# Patient Record
Sex: Female | Born: 1980 | Race: White | Hispanic: No | Marital: Married | Smoking: Never smoker
Health system: Southern US, Community
[De-identification: ages and names within clinical notes are randomized; demographics above are authoritative.]

## PROBLEM LIST (undated history)

## (undated) DIAGNOSIS — R519 Headache, unspecified: Secondary | ICD-10-CM

## (undated) DIAGNOSIS — F319 Bipolar disorder, unspecified: Secondary | ICD-10-CM

## (undated) DIAGNOSIS — F32A Depression, unspecified: Secondary | ICD-10-CM

## (undated) DIAGNOSIS — K219 Gastro-esophageal reflux disease without esophagitis: Secondary | ICD-10-CM

## (undated) DIAGNOSIS — F419 Anxiety disorder, unspecified: Secondary | ICD-10-CM

## (undated) DIAGNOSIS — O24419 Gestational diabetes mellitus in pregnancy, unspecified control: Secondary | ICD-10-CM

## (undated) DIAGNOSIS — F329 Major depressive disorder, single episode, unspecified: Secondary | ICD-10-CM

## (undated) DIAGNOSIS — G4730 Sleep apnea, unspecified: Secondary | ICD-10-CM

## (undated) DIAGNOSIS — Z973 Presence of spectacles and contact lenses: Secondary | ICD-10-CM

## (undated) DIAGNOSIS — M1990 Unspecified osteoarthritis, unspecified site: Secondary | ICD-10-CM

## (undated) DIAGNOSIS — Z9889 Other specified postprocedural states: Secondary | ICD-10-CM

## (undated) DIAGNOSIS — R112 Nausea with vomiting, unspecified: Secondary | ICD-10-CM

## (undated) HISTORY — PX: WISDOM TOOTH EXTRACTION: SHX21

## (undated) HISTORY — PX: CHOLECYSTECTOMY: SHX55

---

## 2008-10-21 ENCOUNTER — Emergency Department (HOSPITAL_COMMUNITY): Admission: EM | Admit: 2008-10-21 | Discharge: 2008-10-21 | Payer: Self-pay | Admitting: Family Medicine

## 2008-10-21 ENCOUNTER — Inpatient Hospital Stay (HOSPITAL_COMMUNITY): Admission: AD | Admit: 2008-10-21 | Discharge: 2008-10-21 | Payer: Self-pay | Admitting: Obstetrics and Gynecology

## 2009-03-07 ENCOUNTER — Encounter: Admission: RE | Admit: 2009-03-07 | Discharge: 2009-03-28 | Payer: Self-pay | Admitting: Obstetrics and Gynecology

## 2009-06-30 ENCOUNTER — Inpatient Hospital Stay (HOSPITAL_COMMUNITY): Admission: AD | Admit: 2009-06-30 | Discharge: 2009-06-30 | Payer: Self-pay | Admitting: Obstetrics and Gynecology

## 2009-08-14 ENCOUNTER — Inpatient Hospital Stay (HOSPITAL_COMMUNITY): Admission: AD | Admit: 2009-08-14 | Discharge: 2009-08-16 | Payer: Self-pay | Admitting: Obstetrics and Gynecology

## 2009-12-18 ENCOUNTER — Emergency Department (HOSPITAL_COMMUNITY): Admission: EM | Admit: 2009-12-18 | Discharge: 2009-12-18 | Payer: Self-pay | Admitting: Family Medicine

## 2010-02-28 ENCOUNTER — Ambulatory Visit (HOSPITAL_COMMUNITY)
Admission: RE | Admit: 2010-02-28 | Discharge: 2010-02-28 | Payer: Self-pay | Source: Home / Self Care | Admitting: Obstetrics and Gynecology

## 2010-03-31 DIAGNOSIS — O24419 Gestational diabetes mellitus in pregnancy, unspecified control: Secondary | ICD-10-CM

## 2010-03-31 HISTORY — DX: Gestational diabetes mellitus in pregnancy, unspecified control: O24.419

## 2010-06-17 LAB — CBC
HCT: 32.5 % — ABNORMAL LOW (ref 36.0–46.0)
MCHC: 35.2 g/dL (ref 30.0–36.0)
MCV: 88.3 fL (ref 78.0–100.0)
MCV: 88.3 fL (ref 78.0–100.0)
Platelets: 208 10*3/uL (ref 150–400)
RBC: 3.68 MIL/uL — ABNORMAL LOW (ref 3.87–5.11)
WBC: 10.8 10*3/uL — ABNORMAL HIGH (ref 4.0–10.5)
WBC: 8.7 10*3/uL (ref 4.0–10.5)

## 2010-06-17 LAB — RPR: RPR Ser Ql: NONREACTIVE

## 2010-07-07 LAB — POCT URINALYSIS DIP (DEVICE)
Protein, ur: NEGATIVE mg/dL
Specific Gravity, Urine: 1.02 (ref 1.005–1.030)
Urobilinogen, UA: 1 mg/dL (ref 0.0–1.0)

## 2010-07-07 LAB — GC/CHLAMYDIA PROBE AMP, GENITAL: GC Probe Amp, Genital: NEGATIVE

## 2010-07-07 LAB — CBC
Hemoglobin: 14.3 g/dL (ref 12.0–15.0)
RBC: 4.6 MIL/uL (ref 3.87–5.11)
RDW: 12.3 % (ref 11.5–15.5)

## 2010-07-07 LAB — WET PREP, GENITAL
Trich, Wet Prep: NONE SEEN
Yeast Wet Prep HPF POC: NONE SEEN

## 2010-07-07 LAB — URINE CULTURE: Culture: NO GROWTH

## 2011-01-22 ENCOUNTER — Inpatient Hospital Stay (HOSPITAL_COMMUNITY): Payer: Self-pay

## 2011-01-22 ENCOUNTER — Inpatient Hospital Stay (HOSPITAL_COMMUNITY)
Admission: AD | Admit: 2011-01-22 | Discharge: 2011-01-22 | Disposition: A | Discharge status: Home / Self Care | Payer: Self-pay | Source: Ambulatory Visit | Attending: Obstetrics and Gynecology | Admitting: Obstetrics and Gynecology

## 2011-01-22 ENCOUNTER — Encounter (HOSPITAL_COMMUNITY): Payer: Self-pay | Admitting: *Deleted

## 2011-01-22 DIAGNOSIS — R109 Unspecified abdominal pain: Secondary | ICD-10-CM | POA: Insufficient documentation

## 2011-01-22 DIAGNOSIS — R102 Pelvic and perineal pain: Secondary | ICD-10-CM

## 2011-01-22 DIAGNOSIS — N72 Inflammatory disease of cervix uteri: Secondary | ICD-10-CM

## 2011-01-22 DIAGNOSIS — N949 Unspecified condition associated with female genital organs and menstrual cycle: Secondary | ICD-10-CM | POA: Insufficient documentation

## 2011-01-22 HISTORY — DX: Gestational diabetes mellitus in pregnancy, unspecified control: O24.419

## 2011-01-22 LAB — DIFFERENTIAL
Eosinophils Absolute: 0.1 10*3/uL (ref 0.0–0.7)
Eosinophils Relative: 1 % (ref 0–5)
Lymphs Abs: 2.6 10*3/uL (ref 0.7–4.0)
Monocytes Absolute: 0.3 10*3/uL (ref 0.1–1.0)
Monocytes Relative: 4 % (ref 3–12)

## 2011-01-22 LAB — CBC
HCT: 38 % (ref 36.0–46.0)
Hemoglobin: 13.2 g/dL (ref 12.0–15.0)
MCH: 29.7 pg (ref 26.0–34.0)
MCV: 85.6 fL (ref 78.0–100.0)
Platelets: 248 10*3/uL (ref 150–400)
RBC: 4.44 MIL/uL (ref 3.87–5.11)

## 2011-01-22 LAB — URINALYSIS, ROUTINE W REFLEX MICROSCOPIC
Bilirubin Urine: NEGATIVE
Glucose, UA: NEGATIVE mg/dL
Specific Gravity, Urine: >1.03 — ABNORMAL HIGH (ref 1.005–1.030)
Urobilinogen, UA: 0.2 mg/dL (ref 0.0–1.0)

## 2011-01-22 LAB — POCT PREGNANCY, URINE: Preg Test, Ur: NEGATIVE

## 2011-01-22 LAB — URINE MICROSCOPIC-ADD ON

## 2011-01-22 MED ORDER — KETOROLAC TROMETHAMINE 60 MG/2ML IM SOLN
60.0000 mg | Freq: Once | INTRAMUSCULAR | Status: AC
  Administered 2011-01-22: 60 mg via INTRAMUSCULAR
  Filled 2011-01-22: qty 2

## 2011-01-22 MED ORDER — NAPROXEN SODIUM 550 MG PO TABS: 550.0000 mg | ORAL_TABLET | Freq: Two times a day (BID) | ORAL | Status: AC

## 2011-01-22 NOTE — Progress Notes (Signed)
Pt seen at [facility] for ?PID today. C/o low abd pain x 2wks, worse with sexual intercourse.

## 2011-01-22 NOTE — ED Provider Notes (Signed)
History     CSN: [id number] Arrival date & time: 01/22/2011  1:34 PM   None     Chief Complaint  Patient presents with  . Abdominal Pain    HPI Vicki Huynh is a 30 y.o. female who presents to MAU for abdominal pain. She was evaluated by her PCP at [facility] and told to come to MAU for ultrasound. Two weeks ago after having sex had pelvic pain that has continued. Abnormal pelvic exam  last week and scheduled for colcoscopy. Patient received Rocephin and sent home with doxycycline for 2 weeks with diagnosis of PID. Has not gotten results from pap smear yet. The history was provided by the patient and her chart sent from [facility].   Past Medical History  Diagnosis Date  . Diabetic mellitus, gestational     No past surgical history on file.  No family history on file.  History  Substance Use Topics  . Smoking status: Never Smoker   . Smokeless tobacco: Not on file  . Alcohol Use: No    OB History    Grav Para Term Preterm Abortions TAB SAB Ect Mult Living   4 2 2  2 1 1   2       Review of Systems  Constitutional: Negative for fever, chills, diaphoresis and fatigue.  HENT: Negative for ear pain, congestion, sore throat, facial swelling, neck pain, neck stiffness, dental problem and sinus pressure.   Eyes: Negative for photophobia, pain and discharge.  Respiratory: Negative for cough, chest tightness and wheezing.   Gastrointestinal: Positive for nausea, abdominal pain and constipation. Negative for vomiting and diarrhea.  Genitourinary: Positive for pelvic pain. Negative for dysuria, frequency, flank pain and difficulty urinating.  Musculoskeletal: Positive for back pain. Negative for myalgias and gait problem.  Skin: Negative for color change and rash.  Neurological: Positive for dizziness. Negative for speech difficulty, weakness, light-headedness, numbness and headaches.  Psychiatric/Behavioral: Negative for confusion and agitation. The patient is  not nervous/anxious.     Allergies  Wool alcohol  Home Medications  No current outpatient prescriptions on file.  BP 121/66  Pulse 92  Temp(Src) 98.7 F (37.1 C) (Oral)  Resp 20  Ht 5\' 6"  (1.676 m)  Wt 215 lb (97.523 kg)  BMI 34.70 kg/m2  SpO2 98%  LMP 12/27/2010  Physical Exam  Nursing note and vitals reviewed. Constitutional: She is oriented to person, place, and time. She appears well-developed and well-nourished.  HENT:  Head: Normocephalic.  Eyes: EOM are normal.  Neck: Neck supple.  Cardiovascular: Normal rate.   Pulmonary/Chest: Effort normal.  Abdominal: Soft. There is no tenderness.  Genitourinary:       External genitalia without lesions. White vaginal discharge. Cervix inflamed, right adnexal tenderness that is mild. Uterus without palpable enlargement.   Musculoskeletal: Normal range of motion.  Neurological: She is alert and oriented to person, place, and time. No cranial nerve deficit.  Skin: Skin is warm and dry.    ED Course  Procedures  US Transvaginal Non-ob  01/22/2011  *RADIOLOGY REPORT*  Clinical Data: Pelvic pain and tenderness.  TRANSABDOMINAL AND TRANSVAGINAL ULTRASOUND OF PELVIS  Technique:  Both transabdominal and transvaginal ultrasound examinations of the pelvis were performed.  Transabdominal technique was performed for global imaging of the pelvis including uterus, ovaries, adnexal regions, and pelvic cul-de-sac.  It was necessary to proceed with endovaginal exam following the transabdominal exam to visualize the endometrium and right ovary.  Comparison:  None.  Findings: Uterus:  Normal in size and appearance  Endometrium: Normal in thickness and appearance.  Double layer endometrial thickness measures 12 mm transvaginally.  Minimal fluid noted in the endometrial cavity, which is of doubtful clinical significance.  Right ovary: Normal appearance/no adnexal mass  Left ovary: Normal appearance/no adnexal mass  Other Findings:  No free fluid   IMPRESSION: No evidence of pelvic mass, fluid collection, or other significant abnormality.  Original Report Authenticated By: Danae Orleans, M.D.   US Pelvis Complete  01/22/2011  *RADIOLOGY REPORT*  Clinical Data: Pelvic pain and tenderness.  TRANSABDOMINAL AND TRANSVAGINAL ULTRASOUND OF PELVIS  Technique:  Both transabdominal and transvaginal ultrasound examinations of the pelvis were performed.  Transabdominal technique was performed for global imaging of the pelvis including uterus, ovaries, adnexal regions, and pelvic cul-de-sac.  It was necessary to proceed with endovaginal exam following the transabdominal exam to visualize the endometrium and right ovary.  Comparison:  None.  Findings: Uterus:  Normal in size and appearance  Endometrium: Normal in thickness and appearance.  Double layer endometrial thickness measures 12 mm transvaginally.  Minimal fluid noted in the endometrial cavity, which is of doubtful clinical significance.  Right ovary: Normal appearance/no adnexal mass  Left ovary: Normal appearance/no adnexal mass  Other Findings:  No free fluid  IMPRESSION: No evidence of pelvic mass, fluid collection, or other significant abnormality.  Original Report Authenticated By: Danae Orleans, M.D.    Results for orders placed during the hospital encounter of 01/22/11 (from the past 24 hour(s))  URINALYSIS, ROUTINE W REFLEX MICROSCOPIC     Status: Abnormal   Collection Time   01/22/11  1:55 PM      Component Value Range   Color, Urine YELLOW  YELLOW    Appearance HAZY (*) CLEAR    Specific Gravity, Urine >1.030 (*) 1.005 - 1.030    pH 6.0  5.0 - 8.0    Glucose, UA NEGATIVE  NEGATIVE (mg/dL)   Hgb urine dipstick TRACE (*) NEGATIVE    Bilirubin Urine NEGATIVE  NEGATIVE    Ketones, ur NEGATIVE  NEGATIVE (mg/dL)   Protein, ur NEGATIVE  NEGATIVE (mg/dL)   Urobilinogen, UA 0.2  0.0 - 1.0 (mg/dL)   Nitrite NEGATIVE  NEGATIVE    Leukocytes, UA SMALL (*) NEGATIVE   URINE MICROSCOPIC-ADD ON      Status: Abnormal   Collection Time   01/22/11  1:55 PM      Component Value Range   Squamous Epithelial / LPF MANY (*) RARE    WBC, UA 3-6  <3 (WBC/hpf)   Bacteria, UA FEW (*) RARE    Urine-Other MUCOUS PRESENT    POCT PREGNANCY, URINE     Status: Normal   Collection Time   01/22/11  3:34 PM      Component Value Range   Preg Test, Ur NEGATIVE    CBC     Status: Normal   Collection Time   01/22/11  3:37 PM      Component Value Range   WBC 7.9  4.0 - 10.5 (K/uL)   RBC 4.44  3.87 - 5.11 (MIL/uL)   Hemoglobin 13.2  12.0 - 15.0 (g/dL)   HCT 16.1  09.6 - 04.5 (%)   MCV 85.6  78.0 - 100.0 (fL)   MCH 29.7  26.0 - 34.0 (pg)   MCHC 34.7  30.0 - 36.0 (g/dL)   RDW 40.9  81.1 - 91.4 (%)   Platelets 248  150 - 400 (K/uL)  DIFFERENTIAL  Status: Normal   Collection Time   01/22/11  3:37 PM      Component Value Range   Neutrophils Relative 62  43 - 77 (%)   Neutro Abs 4.8  1.7 - 7.7 (K/uL)   Lymphocytes Relative 33  12 - 46 (%)   Lymphs Abs 2.6  0.7 - 4.0 (K/uL)   Monocytes Relative 4  3 - 12 (%)   Monocytes Absolute 0.3  0.1 - 1.0 (K/uL)   Eosinophils Relative 1  0 - 5 (%)   Eosinophils Absolute 0.1  0.0 - 0.7 (K/uL)   Basophils Relative 0  0 - 1 (%)   Basophils Absolute 0.0  0.0 - 0.1 (K/uL)   Assessment:  Pelvic Pain  Plan:   Continue antibiotics     Keep follow up appointment for coloscopy    Anaprox DS for pain    MDM          Kerrie Buffalo, NP 01/22/11 1621

## 2011-01-22 NOTE — Progress Notes (Signed)
Patient states she started having abdominal pain about 2 weeks after having intercourse. Has been going to her MD and was sent to MAU for evaluation. Patient states the pain continues, slight nausea and a little increase in vaginal discharge. Patient had an Essure placed in 2011.

## 2011-01-27 NOTE — ED Provider Notes (Signed)
Agree with above note.  Vicki Huynh 01/27/2011 11:28 AM

## 2012-07-07 ENCOUNTER — Other Ambulatory Visit (HOSPITAL_COMMUNITY)
Admission: RE | Admit: 2012-07-07 | Discharge: 2012-07-07 | Disposition: A | Discharge status: Home / Self Care | Payer: BC Managed Care – PPO | Source: Ambulatory Visit | Attending: Family Medicine | Admitting: Family Medicine

## 2012-07-07 ENCOUNTER — Other Ambulatory Visit: Payer: Self-pay | Admitting: Family Medicine

## 2012-07-07 DIAGNOSIS — Z124 Encounter for screening for malignant neoplasm of cervix: Secondary | ICD-10-CM | POA: Insufficient documentation

## 2012-07-07 DIAGNOSIS — Z1151 Encounter for screening for human papillomavirus (HPV): Secondary | ICD-10-CM | POA: Insufficient documentation

## 2013-05-05 ENCOUNTER — Encounter (HOSPITAL_COMMUNITY): Payer: Self-pay | Admitting: Emergency Medicine

## 2013-05-05 ENCOUNTER — Emergency Department (HOSPITAL_COMMUNITY)
Admission: EM | Admit: 2013-05-05 | Discharge: 2013-05-05 | Disposition: A | Discharge status: Home / Self Care | Payer: BC Managed Care – PPO | Attending: Emergency Medicine | Admitting: Emergency Medicine

## 2013-05-05 ENCOUNTER — Emergency Department (HOSPITAL_COMMUNITY): Payer: BC Managed Care – PPO

## 2013-05-05 DIAGNOSIS — Z792 Long term (current) use of antibiotics: Secondary | ICD-10-CM | POA: Insufficient documentation

## 2013-05-05 DIAGNOSIS — R05 Cough: Secondary | ICD-10-CM | POA: Insufficient documentation

## 2013-05-05 DIAGNOSIS — R0602 Shortness of breath: Secondary | ICD-10-CM | POA: Insufficient documentation

## 2013-05-05 DIAGNOSIS — R0789 Other chest pain: Secondary | ICD-10-CM | POA: Insufficient documentation

## 2013-05-05 DIAGNOSIS — R059 Cough, unspecified: Secondary | ICD-10-CM | POA: Insufficient documentation

## 2013-05-05 DIAGNOSIS — IMO0001 Reserved for inherently not codable concepts without codable children: Secondary | ICD-10-CM | POA: Insufficient documentation

## 2013-05-05 DIAGNOSIS — M25519 Pain in unspecified shoulder: Secondary | ICD-10-CM | POA: Insufficient documentation

## 2013-05-05 DIAGNOSIS — Z8632 Personal history of gestational diabetes: Secondary | ICD-10-CM | POA: Insufficient documentation

## 2013-05-05 HISTORY — DX: Major depressive disorder, single episode, unspecified: F32.9

## 2013-05-05 HISTORY — DX: Depression, unspecified: F32.A

## 2013-05-05 MED ORDER — DIAZEPAM 5 MG PO TABS: 5.0000 mg | ORAL_TABLET | Freq: Two times a day (BID) | ORAL | Status: DC

## 2013-05-05 MED ORDER — HYDROCODONE-ACETAMINOPHEN 5-325 MG PO TABS: 1.0000 | ORAL_TABLET | ORAL | Status: DC | PRN

## 2013-05-05 NOTE — ED Provider Notes (Signed)
Medical screening examination/treatment/procedure(s) were performed by non-physician practitioner and as supervising physician I was immediately available for consultation/collaboration.  EKG Interpretation    Date/Time:  Thursday May 05 2013 11:22:10 EST Ventricular Rate:  82 PR Interval:  134 QRS Duration: 90 QT Interval:  364 QTC Calculation: 425 R Axis:   -8 Text Interpretation:  Normal sinus rhythm Moderate voltage criteria for LVH, may be normal variant Nonspecific T wave abnormality No old tracing to compare Confirmed by Rosenhayn  MD-I, Victorious Kundinger ([id number]) on 05/05/2013 11:31:40 AM            Rolland Porter, MD, Abram Sander   Janice Norrie, MD 05/05/13 [phone]

## 2013-05-05 NOTE — ED Notes (Addendum)
PA at bedside.   sts does not want immobilizer.

## 2013-05-05 NOTE — ED Provider Notes (Signed)
CSN: [id number]     Arrival date & time 05/05/13  0957 History   None   This chart was scribed for Barton Dubois PA-C, a non-physician practitioner working with Janice Norrie, MD by Denice Bors, ED Scribe. This patient was seen in room TR07C/TR07C and the patient's care was started at 10:33 AM     Chief Complaint  Patient presents with  . Shoulder Pain   (Consider location/radiation/quality/duration/timing/severity/associated sxs/prior Treatment) The history is provided by the patient. No language interpreter was used.   HPI Comments: Vicki Huynh is a 33 y.o. female who presents to the Emergency Department complaining of constant left shoulder pain radiating to left neck onset gradual for 3 weeks with no known precipitating factors. States she was evaluated recently by PCP for shoulder pain. Describes pain as moderate in severity. Reports associated gradually worsening shortness of breath (3 days), dry cough (1 week), and chest "pressure". Reports shoulder pain is exacerbated at night and with movement of joint. Reports taking Aleve, heat and cold compresses for 5 days with no relief of symptoms. Denies associated recent trauma, fever, numbness, and weakness. Denies smoking cigarettes. Reports PMHx of arthritis, and asthma as a child. Reports family hx of rheumatoid arthritis.  No FH MI.  Past Medical History  Diagnosis Date  . Diabetic mellitus, gestational    Past Surgical History  Procedure Laterality Date  . Wisdom tooth extraction     No family history on file. History  Substance Use Topics  . Smoking status: Never Smoker   . Smokeless tobacco: Not on file  . Alcohol Use: No   OB History   Grav Para Term Preterm Abortions TAB SAB Ect Mult Living   4 2 2  2 1 1   2      Review of Systems  Constitutional: Negative for fever.  Musculoskeletal: Positive for myalgias.  Skin: Positive for wound.  Psychiatric/Behavioral: Negative for confusion.    Allergies  Wool  alcohol  Home Medications   Current Outpatient Rx  Name  Route  Sig  Dispense  Refill  . doxycycline (VIBRAMYCIN) 100 MG capsule   Oral   Take 100 mg by mouth 2 (two) times daily. Patient started taking on 01-17-11 and it is a 2 week course           There were no vitals taken for this visit. Physical Exam  Nursing note and vitals reviewed. Constitutional: She is oriented to person, place, and time. She appears well-developed and well-nourished. No distress.  HENT:  Head: Normocephalic and atraumatic.  Eyes:  Normal appearance  Neck: Normal range of motion.  Cardiovascular: Normal rate and regular rhythm.   Pulmonary/Chest: Effort normal and breath sounds normal. No respiratory distress.  Musculoskeletal: Normal range of motion.  L shoulder w/out deformity, erythema or edema.  Mild tenderness mid-line C5-C6 as well as L trap, ant/post shoulder and deltoid.  Pain w/ passive flexion/abduction greater than 30deg but 5/5 strength in these positions.  2+ radial pulse and distal sensation intact.      Neurological: She is alert and oriented to person, place, and time.  Skin: Skin is warm and dry. No rash noted.  Psychiatric: She has a normal mood and affect. Her behavior is normal.    ED Course  Procedures  COORDINATION OF CARE:  Nursing notes reviewed. Vital signs reviewed. Initial pt interview and examination performed.   10:44 AM-Discussed work up plan with pt at bedside, which includes CXR. Pt agrees with plan.  Treatment plan initiated:Medications - No data to display   Initial diagnostic testing ordered.    Labs Review Labs Reviewed - No data to display Imaging Review No results found.  EKG Interpretation   None       MDM   1. Shoulder pain    Healthy 32yo F presents w/ atraumatic L shoulder pain x 2-3 weeks.  Exam most consistent w/ muscle strain but differential also include cervical radiculopathy and arthritis (pt has arthritis of hands, both parents  w/ RA, pt w/ remote neg work-up).  No findings concerning for septic arthritis.  No RF for ACS.  Will obtain screening EKG. Will treat symptomatically w/ shoulder sling (provided by nursing staff), ice, NSAID and valium.  Pt also c/o cough x 7d + SOB and chest heaviness x 3d.  No respiratory distress and nml breath sounds on exam.  CXR ordered to r/o pna.  10:47 AM   CXR negative and EKG non-ischemic; no prior to compare.  All results discussed w/ patient.  Return precautions discussed. 11:52 AM   I personally performed the services described in this documentation, which was scribed in my presence. The recorded information has been reviewed and is accurate. s   Remer Macho, PA-C 05/05/13 1152

## 2013-05-05 NOTE — ED Notes (Signed)
Pt reports left shoulder pain x 3 weeks, unsure of injury.

## 2013-05-05 NOTE — ED Notes (Signed)
Schinlever, PA at bedside for evaluation. 

## 2013-05-05 NOTE — Discharge Instructions (Signed)
Take vicodin as needed for severe pain and valium as needed for spasm.   Do not drive within four hours of taking these medications (may cause drowsiness or confusion).   Apply heat or ice to painful areas and avoid activities that aggravate pain.  Follow up with the orthopedist if your pain has not started to improve in 5-7 days, or you develop weakness of the injured joint.   Return to ER if you develop fever, redness or worsening pain of joint, worsening chest pain or shortness of breath.

## 2014-01-30 ENCOUNTER — Encounter (HOSPITAL_COMMUNITY): Payer: Self-pay | Admitting: Emergency Medicine

## 2014-02-06 ENCOUNTER — Encounter: Payer: Self-pay | Admitting: Neurology

## 2014-02-06 ENCOUNTER — Ambulatory Visit (INDEPENDENT_AMBULATORY_CARE_PROVIDER_SITE_OTHER): Payer: BC Managed Care – PPO | Admitting: Neurology

## 2014-02-06 ENCOUNTER — Encounter: Payer: Self-pay | Admitting: *Deleted

## 2014-02-06 VITALS — BP 119/80 | HR 86 | Temp 97.5°F | Ht 67.0 in | Wt 243.0 lb

## 2014-02-06 DIAGNOSIS — G5622 Lesion of ulnar nerve, left upper limb: Secondary | ICD-10-CM

## 2014-02-06 DIAGNOSIS — G5621 Lesion of ulnar nerve, right upper limb: Secondary | ICD-10-CM

## 2014-02-06 DIAGNOSIS — M79629 Pain in unspecified upper arm: Secondary | ICD-10-CM

## 2014-02-06 DIAGNOSIS — R202 Paresthesia of skin: Secondary | ICD-10-CM | POA: Insufficient documentation

## 2014-02-06 DIAGNOSIS — G5620 Lesion of ulnar nerve, unspecified upper limb: Secondary | ICD-10-CM | POA: Insufficient documentation

## 2014-02-06 DIAGNOSIS — G5623 Lesion of ulnar nerve, bilateral upper limbs: Secondary | ICD-10-CM

## 2014-02-06 DIAGNOSIS — M25529 Pain in unspecified elbow: Secondary | ICD-10-CM

## 2014-02-06 NOTE — Patient Instructions (Signed)
Overall you are doing fairly well but I do want to suggest a few things today:   Remember to drink plenty of fluid, eat healthy meals and do not skip any meals. Try to eat protein with a every meal and eat a healthy snack such as fruit or nuts in between meals. Try to keep a regular sleep-wake schedule and try to exercise daily, particularly in the form of walking, 20-30 minutes a day, if you can.   As far as diagnostic testing: EMG/NCS  I would like to see you back in one week for EMG/NCS, sooner if we need to. Please call us with any interim questions, concerns, problems, updates or refill requests.   Please also call us for any test results so we can go over those with you on the phone.  My clinical assistant and will answer any of your questions and relay your messages to me and also relay most of my messages to you.   Our phone number is [phone]. We also have an after hours call service for urgent matters and there is a physician on-call for urgent questions. For any emergencies you know to call 911 or go to the nearest emergency room

## 2014-02-06 NOTE — Progress Notes (Signed)
[facility]    Provider:  Dr Jaynee Eagles Referring Provider: Melinda Crutch, MD Primary Care Physician:  Tawanna Solo, MD  CC:  Hand pain  HPI:  Vicki Huynh is a 33 y.o. female here as a referral from Dr. Harrington Challenger for bilateral hand pain  She has numbness and tingling in in fingers 3-5 of the right> left hand. Is right dominant. Going on 4-5 months. It is severe, she was in tears the other day. Tries Alleve, tylenol nothing works. Tried icing it. No neck pain. Symptoms can radiate into the forearms but mostly stay in the hands. 10/10 painful. More painful with use. Numb continuously. Thought she had RA but tests were negative. Has a lot of discomfoirt in the elbows bilaterally. She crochets a lot but doesn't bother her at this time. Feels like she can't grab things. Her fingers are swollen. Has gained weight recently. She tried wrist splints which did not help. Father and mother with Rheumatoid Arthritis. She has joint pain.   Reviewed notes, labs and imaging from outside physicians, which showed: PMHx obesity, Vit D deficiency, major depression, GAD.   Review of Systems: Patient complains of symptoms per HPI as well as the following symptoms: weight gain, fatigue, numbness, joint pain, joint swelling, numbness, depression, anxiety, decreased energy. Pertinent negatives per HPI. All others negative.   History   Social History  . Marital Status: Married    Spouse Name: N/A    Number of Children: N/A  . Years of Education: N/A   Occupational History  . Not on file.   Social History Main Topics  . Smoking status: Never Smoker   . Smokeless tobacco: Never Used  . Alcohol Use: No  . Drug Use: No  . Sexual Activity: Yes    Birth Control/ Protection: Pill   Other Topics Concern  . Not on file   Social History Narrative    Family History  Problem Relation Age of Onset  . Rheum arthritis Mother   . Rheum arthritis Father     Past Medical History    Diagnosis Date  . Diabetic mellitus, gestational   . Depression     Past Surgical History  Procedure Laterality Date  . Wisdom tooth extraction      Current Outpatient Prescriptions  Medication Sig Dispense Refill  . cholecalciferol (D-VI-SOL) 400 UNIT/ML LIQD Take 400 Units by mouth daily.    . traMADol (ULTRAM) 50 MG tablet Take 50 mg by mouth every 6 (six) hours as needed.    . venlafaxine XR (EFFEXOR-XR) 75 MG 24 hr capsule Take 75 mg by mouth daily with breakfast.    . diazepam (VALIUM) 5 MG tablet Take 1 tablet (5 mg total) by mouth 2 (two) times daily. 10 tablet 0  . HYDROcodone-acetaminophen (NORCO/VICODIN) 5-325 MG per tablet Take 1 tablet by mouth every 4 (four) hours as needed for moderate pain. 10 tablet 0  . levonorgestrel-ethinyl estradiol (NORDETTE) 0.15-30 MG-MCG tablet Take 1 tablet by mouth daily.    . naproxen sodium (ANAPROX) 220 MG tablet Take 220 mg by mouth 2 (two) times daily as needed (for pain).    Marland Kitchen PRESCRIPTION MEDICATION Take 1 tablet by mouth daily. Birth control medication     No current facility-administered medications for this visit.    Allergies as of 02/06/2014 - Review Complete 02/06/2014  Allergen Reaction Noted  . Pertussis vaccines  05/05/2013  . Wool alcohol [lanolin alcohol] Itching 01/22/2011    Vitals: BP 119/80 mmHg  Pulse 86  Temp(Src) 97.5 F (36.4 C) (Oral)  Ht 5\' 7"  (1.702 m)  Wt 243 lb (110.224 kg)  BMI 38.05 kg/m2 Last Weight:  Wt Readings from Last 1 Encounters:  02/06/14 243 lb (110.224 kg)   Last Height:   Ht Readings from Last 1 Encounters:  02/06/14 5\' 7"  (1.702 m)    Physical exam: Exam: Gen: NAD, conversant, well nourised, obese, well groomed                     CV: RRR, no MRG. No Carotid Bruits. No peripheral edema, warm, nontender Eyes: Conjunctivae clear without exudates or hemorrhage  Neuro: Detailed Neurologic Exam  Speech:    Speech is normal; fluent and spontaneous with normal comprehension.   Cognition:    The patient is oriented to person, place, and time;     recent and remote memory intact;     language fluent;     normal attention, concentration,     fund of knowledge Cranial Nerves:    The pupils are equal, round, and reactive to light. The fundi are normal and spontaneous venous pulsations are present. Visual fields are full to finger confrontation. Extraocular movements are intact. Trigeminal sensation is intact and the muscles of mastication are normal. The face is symmetric. The palate elevates in the midline. Voice is normal. Shoulder shrug is normal. The tongue has normal motion without fasciculations.   Coordination:    Normal finger to nose and heel to shin.   Gait:    Heel-toe and tandem gait are normal.   Motor Observation:.  +Tinels at the carpal tunnel. +Tinel's at the wrist.  Hypothenar flattening on the right .  Tone:    Normal muscle tone.    Posture:    Posture is normal. normal erect    Strength:    Strength is V/V in the upper and lower limbs with give-way     Sensation: decreased in an ulnar distribution right > left     Reflex Exam:  DTR's:    Deep tendon reflexes in the upper and lower extremities are normal bilaterally.   Toes:    The toes are downgoing bilaterally.   Clonus:    Clonus is absent.      Assessment/Plan:  33 year old female with paresthesias in an ulnar distribution, hypothenar flattening right > left. No weakness but does have give-way possibly due to pain. Has arthritis and joint pain and swelling in the hands. Will order an EMG/NCS to evaluate for ulnar neuropathy vs cervical radiculopathy or polyneuropathy. Discussed conservative measures, keeping elbows off the tables, wearing an elbow brace at night. For the joint pain can take OTC alleve or her prescribed naproxen as needed(not both), take with food and stop for GI upset, do not take more than daily maximum daily dose.   Sarina Ill, MD  [facility] [address] [place], [place] [address]  Phone [phone] Fax [phone]

## 2014-02-07 ENCOUNTER — Encounter: Payer: BC Managed Care – PPO | Admitting: Radiology

## 2014-02-07 ENCOUNTER — Encounter: Payer: BC Managed Care – PPO | Admitting: Neurology

## 2014-02-14 ENCOUNTER — Encounter: Payer: BC Managed Care – PPO | Admitting: Radiology

## 2014-02-14 ENCOUNTER — Encounter: Payer: BC Managed Care – PPO | Admitting: Neurology

## 2014-09-25 ENCOUNTER — Encounter (HOSPITAL_COMMUNITY): Payer: Self-pay | Admitting: General Practice

## 2014-10-08 NOTE — H&P (Signed)
Vicki Huynh is an 34 y.o. female. She was seen as a consult in April to discuss several issues. Had Essure 10/2009. 6 months after Essure she felt pregnant, saw PCP who did Pap and negative pregnancy test. 6 months after that diagnosed with significant depression and anxiety. 6 months after that started having joint pain. also having nausea after eating, abdominal bloating, fatigue, headaches. Was on OCP to help with menses, stopped to try to lose weight, but unable to lose weight without medication to help, then weight came back. Having menses monthly, heavy and severe cramps, Midol, Pamprin, Tramadol and T#3 not much help. She is concerned that most of these symptoms are from the Essure coils.  Pelvic ultrasound with saline infusion was normal, endometrial biopsy was benign.  After discussing her options, she wishes to have surgery to remove her tubes and Essure coils, and endometrial ablation to help with menses.   Pertinent Gynecological History: OB History: G4, P2022 SVD x 2   Menstrual History: Patient's last menstrual period was 08/28/2014 (approximate).    Past Medical History  Diagnosis Date  . Diabetic mellitus, gestational   . Depression     Past Surgical History  Procedure Laterality Date  . Wisdom tooth extraction      Family History  Problem Relation Age of Onset  . Rheum arthritis Mother   . Rheum arthritis Father     Social History:  reports that she has never smoked. She has never used smokeless tobacco. She reports that she does not drink alcohol or use illicit drugs.  Allergies:  Allergies  Allergen Reactions  . Wool Alcohol [Lanolin Alcohol] Itching  . Pertussis Vaccines Rash and Other (See Comments)    fever    No prescriptions prior to admission    Review of Systems  Respiratory: Negative.   Cardiovascular: Negative.   Gastrointestinal: Negative.   Genitourinary: Negative.     Height 5\' 7"  (1.702 m), weight 108.863 kg (240 lb), last menstrual  period 08/28/2014. Physical Exam  Constitutional: She appears well-developed and well-nourished.  Cardiovascular: Normal rate, regular rhythm and normal heart sounds.   No murmur heard. Respiratory: Effort normal and breath sounds normal. No respiratory distress.  GI: Soft. She exhibits no distension and no mass. There is no tenderness.  Genitourinary: Vagina normal and uterus normal.  No adnexal mass    No results found for this or any previous visit (from the past 24 hour(s)).  No results found.  Assessment/Plan: Pelvic pain and multiple symptoms being attributed to effects from Essure coils, menorrhagia and dysmenorrhea.  All medical and surgical options have been discussed.  Discussed that I cannot guarantee that removing her Essure coils will fix any of her symptoms.  Also discussed that endometrial ablation can make pain with menses worse.  Discussed surgical procedure, risks, chances of relieving symptoms.  Will admit for laparoscopic bilateral salpingectomy with removal of Essure coils and hysteroscopy with Novasure ablation.  Willies Laviolette D 10/08/2014, 8:29 PM

## 2014-10-09 ENCOUNTER — Encounter (HOSPITAL_COMMUNITY): Payer: Self-pay | Admitting: *Deleted

## 2014-10-09 ENCOUNTER — Ambulatory Visit (HOSPITAL_COMMUNITY): Payer: BLUE CROSS/BLUE SHIELD | Admitting: Anesthesiology

## 2014-10-09 ENCOUNTER — Ambulatory Visit (HOSPITAL_COMMUNITY)
Admission: RE | Admit: 2014-10-09 | Discharge: 2014-10-09 | Disposition: A | Discharge status: Home / Self Care | Payer: BLUE CROSS/BLUE SHIELD | Source: Ambulatory Visit | Attending: Obstetrics and Gynecology | Admitting: Obstetrics and Gynecology

## 2014-10-09 ENCOUNTER — Encounter (HOSPITAL_COMMUNITY)
Admission: RE | Disposition: A | Discharge status: Home / Self Care | Payer: Self-pay | Source: Ambulatory Visit | Attending: Obstetrics and Gynecology

## 2014-10-09 DIAGNOSIS — Z30432 Encounter for removal of intrauterine contraceptive device: Secondary | ICD-10-CM | POA: Insufficient documentation

## 2014-10-09 DIAGNOSIS — F329 Major depressive disorder, single episode, unspecified: Secondary | ICD-10-CM | POA: Insufficient documentation

## 2014-10-09 DIAGNOSIS — Z887 Allergy status to serum and vaccine status: Secondary | ICD-10-CM | POA: Diagnosis not present

## 2014-10-09 DIAGNOSIS — Z9109 Other allergy status, other than to drugs and biological substances: Secondary | ICD-10-CM | POA: Insufficient documentation

## 2014-10-09 DIAGNOSIS — N920 Excessive and frequent menstruation with regular cycle: Secondary | ICD-10-CM | POA: Diagnosis not present

## 2014-10-09 DIAGNOSIS — N946 Dysmenorrhea, unspecified: Secondary | ICD-10-CM | POA: Diagnosis present

## 2014-10-09 DIAGNOSIS — R102 Pelvic and perineal pain: Secondary | ICD-10-CM | POA: Diagnosis present

## 2014-10-09 HISTORY — PX: LAPAROSCOPIC BILATERAL SALPINGECTOMY: SHX5889

## 2014-10-09 HISTORY — PX: HYSTEROSCOPY WITH NOVASURE: SHX5574

## 2014-10-09 LAB — CBC
HCT: 37.1 % (ref 36.0–46.0)
HEMOGLOBIN: 13.1 g/dL (ref 12.0–15.0)
MCH: 29.8 pg (ref 26.0–34.0)
MCHC: 35.3 g/dL (ref 30.0–36.0)
MCV: 84.3 fL (ref 78.0–100.0)
Platelets: 291 10*3/uL (ref 150–400)
RBC: 4.4 MIL/uL (ref 3.87–5.11)
RDW: 13.1 % (ref 11.5–15.5)
WBC: 7.4 10*3/uL (ref 4.0–10.5)

## 2014-10-09 LAB — PREGNANCY, URINE: PREG TEST UR: NEGATIVE

## 2014-10-09 SURGERY — SALPINGECTOMY, BILATERAL, LAPAROSCOPIC
Anesthesia: General

## 2014-10-09 MED ORDER — SCOPOLAMINE 1 MG/3DAYS TD PT72
MEDICATED_PATCH | TRANSDERMAL | Status: AC
  Administered 2014-10-09: 1.5 mg via TRANSDERMAL
  Filled 2014-10-09: qty 1

## 2014-10-09 MED ORDER — HYDROCODONE-ACETAMINOPHEN 5-325 MG PO TABS: 1.0000 | ORAL_TABLET | Freq: Four times a day (QID) | ORAL | Status: DC | PRN

## 2014-10-09 MED ORDER — ROCURONIUM BROMIDE 100 MG/10ML IV SOLN
INTRAVENOUS | Status: DC | PRN
  Administered 2014-10-09: 30 mg via INTRAVENOUS
  Administered 2014-10-09: 10 mg via INTRAVENOUS

## 2014-10-09 MED ORDER — LIDOCAINE HCL (CARDIAC) 20 MG/ML IV SOLN
INTRAVENOUS | Status: DC | PRN
  Administered 2014-10-09: 50 mg via INTRAVENOUS

## 2014-10-09 MED ORDER — FENTANYL CITRATE (PF) 250 MCG/5ML IJ SOLN
INTRAMUSCULAR | Status: AC
  Filled 2014-10-09: qty 5

## 2014-10-09 MED ORDER — NEOSTIGMINE METHYLSULFATE 10 MG/10ML IV SOLN
INTRAVENOUS | Status: DC | PRN
  Administered 2014-10-09: 2 mg via INTRAVENOUS

## 2014-10-09 MED ORDER — DEXAMETHASONE SODIUM PHOSPHATE 4 MG/ML IJ SOLN
INTRAMUSCULAR | Status: AC
  Filled 2014-10-09: qty 1

## 2014-10-09 MED ORDER — FENTANYL CITRATE (PF) 100 MCG/2ML IJ SOLN: 25.0000 ug | INTRAMUSCULAR | Status: DC | PRN

## 2014-10-09 MED ORDER — ONDANSETRON HCL 4 MG/2ML IJ SOLN
INTRAMUSCULAR | Status: AC
  Filled 2014-10-09: qty 2

## 2014-10-09 MED ORDER — KETOROLAC TROMETHAMINE 30 MG/ML IJ SOLN
INTRAMUSCULAR | Status: DC | PRN
  Administered 2014-10-09: 30 mg via INTRAVENOUS

## 2014-10-09 MED ORDER — PROPOFOL 10 MG/ML IV BOLUS
INTRAVENOUS | Status: AC
  Filled 2014-10-09: qty 20

## 2014-10-09 MED ORDER — GLYCOPYRROLATE 0.2 MG/ML IJ SOLN
INTRAMUSCULAR | Status: AC
  Filled 2014-10-09: qty 2

## 2014-10-09 MED ORDER — BUPIVACAINE HCL (PF) 0.25 % IJ SOLN
INTRAMUSCULAR | Status: AC
  Filled 2014-10-09: qty 30

## 2014-10-09 MED ORDER — MIDAZOLAM HCL 2 MG/2ML IJ SOLN
INTRAMUSCULAR | Status: AC
  Filled 2014-10-09: qty 2

## 2014-10-09 MED ORDER — BUPIVACAINE HCL (PF) 0.25 % IJ SOLN
INTRAMUSCULAR | Status: DC | PRN
  Administered 2014-10-09: 15 mL

## 2014-10-09 MED ORDER — DEXAMETHASONE SODIUM PHOSPHATE 10 MG/ML IJ SOLN
INTRAMUSCULAR | Status: DC | PRN
  Administered 2014-10-09: 4 mg via INTRAVENOUS

## 2014-10-09 MED ORDER — LIDOCAINE HCL 2 % IJ SOLN
INTRAMUSCULAR | Status: DC | PRN
  Administered 2014-10-09: 16 mL

## 2014-10-09 MED ORDER — LIDOCAINE HCL (CARDIAC) 20 MG/ML IV SOLN
INTRAVENOUS | Status: AC
  Filled 2014-10-09: qty 5

## 2014-10-09 MED ORDER — PROPOFOL 10 MG/ML IV BOLUS
INTRAVENOUS | Status: DC | PRN
  Administered 2014-10-09: 190 mg via INTRAVENOUS

## 2014-10-09 MED ORDER — GLYCOPYRROLATE 0.2 MG/ML IJ SOLN
INTRAMUSCULAR | Status: DC | PRN
  Administered 2014-10-09: 0.4 mg via INTRAVENOUS

## 2014-10-09 MED ORDER — MIDAZOLAM HCL 2 MG/2ML IJ SOLN
INTRAMUSCULAR | Status: DC | PRN
  Administered 2014-10-09: 2 mg via INTRAVENOUS

## 2014-10-09 MED ORDER — ROCURONIUM BROMIDE 100 MG/10ML IV SOLN
INTRAVENOUS | Status: AC
  Filled 2014-10-09: qty 1

## 2014-10-09 MED ORDER — LIDOCAINE HCL 2 % IJ SOLN
INTRAMUSCULAR | Status: AC
  Filled 2014-10-09: qty 20

## 2014-10-09 MED ORDER — SODIUM CHLORIDE 0.9 % IR SOLN
Status: DC | PRN
  Administered 2014-10-09: 3000 mL

## 2014-10-09 MED ORDER — SCOPOLAMINE 1 MG/3DAYS TD PT72
1.0000 | MEDICATED_PATCH | Freq: Once | TRANSDERMAL | Status: DC
  Administered 2014-10-09: 1.5 mg via TRANSDERMAL

## 2014-10-09 MED ORDER — ONDANSETRON HCL 4 MG/2ML IJ SOLN
INTRAMUSCULAR | Status: DC | PRN
  Administered 2014-10-09: 4 mg via INTRAVENOUS

## 2014-10-09 MED ORDER — NEOSTIGMINE METHYLSULFATE 10 MG/10ML IV SOLN
INTRAVENOUS | Status: AC
  Filled 2014-10-09: qty 1

## 2014-10-09 MED ORDER — LACTATED RINGERS IV SOLN
INTRAVENOUS | Status: DC
  Administered 2014-10-09 (×3): via INTRAVENOUS

## 2014-10-09 MED ORDER — HEPARIN SODIUM (PORCINE) 5000 UNIT/ML IJ SOLN
INTRAMUSCULAR | Status: AC
  Filled 2014-10-09: qty 1

## 2014-10-09 MED ORDER — FENTANYL CITRATE (PF) 100 MCG/2ML IJ SOLN
INTRAMUSCULAR | Status: DC | PRN
  Administered 2014-10-09: 100 ug via INTRAVENOUS
  Administered 2014-10-09 (×2): 50 ug via INTRAVENOUS

## 2014-10-09 SURGICAL SUPPLY — 36 items
ABLATOR ENDOMETRIAL BIPOLAR (ABLATOR) ×4 IMPLANT
CABLE HIGH FREQUENCY MONO STRZ (ELECTRODE) IMPLANT
CATH ROBINSON RED A/P 16FR (CATHETERS) ×4 IMPLANT
CHLORAPREP W/TINT 26ML (MISCELLANEOUS) ×4 IMPLANT
CLOTH BEACON ORANGE TIMEOUT ST (SAFETY) ×4 IMPLANT
DECANTER SPIKE VIAL GLASS SM (MISCELLANEOUS) ×4 IMPLANT
DRSG COVADERM PLUS 2X2 (GAUZE/BANDAGES/DRESSINGS) ×8 IMPLANT
DRSG OPSITE POSTOP 3X4 (GAUZE/BANDAGES/DRESSINGS) IMPLANT
GLOVE BIO SURGEON STRL SZ8 (GLOVE) ×4 IMPLANT
GLOVE ORTHO TXT STRL SZ7.5 (GLOVE) ×4 IMPLANT
GOWN STRL REUS W/TWL 2XL LVL3 (GOWN DISPOSABLE) ×4 IMPLANT
GOWN STRL REUS W/TWL LRG LVL3 (GOWN DISPOSABLE) ×8 IMPLANT
LIQUID BAND (GAUZE/BANDAGES/DRESSINGS) ×4 IMPLANT
NEEDLE EPID 17G 5 ECHO TUOHY (NEEDLE) IMPLANT
NEEDLE INSUFFLATION 120MM (ENDOMECHANICALS) ×4 IMPLANT
NS IRRIG 1000ML POUR BTL (IV SOLUTION) ×4 IMPLANT
PACK LAPAROSCOPY BASIN (CUSTOM PROCEDURE TRAY) ×4 IMPLANT
PACK VAGINAL MINOR WOMEN LF (CUSTOM PROCEDURE TRAY) ×4 IMPLANT
PAD OB MATERNITY 4.3X12.25 (PERSONAL CARE ITEMS) ×4 IMPLANT
PAD POSITIONER PINK NONSTERILE (MISCELLANEOUS) ×4 IMPLANT
POUCH SPECIMEN RETRIEVAL 10MM (ENDOMECHANICALS) IMPLANT
PROTECTOR NERVE ULNAR (MISCELLANEOUS) ×4 IMPLANT
SET IRRIG TUBING LAPAROSCOPIC (IRRIGATION / IRRIGATOR) IMPLANT
SHEARS HARMONIC ACE PLUS 36CM (ENDOMECHANICALS) IMPLANT
SLEEVE XCEL OPT CAN 5 100 (ENDOMECHANICALS) IMPLANT
SOLUTION ELECTROLUBE (MISCELLANEOUS) IMPLANT
SUT VICRYL 0 UR6 27IN ABS (SUTURE) IMPLANT
SUT VICRYL 4-0 PS2 18IN ABS (SUTURE) ×4 IMPLANT
TOWEL OR 17X24 6PK STRL BLUE (TOWEL DISPOSABLE) ×8 IMPLANT
TRAY FOLEY CATH SILVER 14FR (SET/KITS/TRAYS/PACK) IMPLANT
TROCAR XCEL NON-BLD 11X100MML (ENDOMECHANICALS) IMPLANT
TROCAR XCEL NON-BLD 5MMX100MML (ENDOMECHANICALS) ×4 IMPLANT
TROCAR XCEL OPT SLVE 5M 100M (ENDOMECHANICALS) ×8 IMPLANT
TUBING AQUILEX INFLOW (TUBING) ×4 IMPLANT
WARMER LAPAROSCOPE (MISCELLANEOUS) ×4 IMPLANT
WATER STERILE IRR 1000ML POUR (IV SOLUTION) ×4 IMPLANT

## 2014-10-09 NOTE — Interval H&P Note (Signed)
History and Physical Interval Note:  10/09/2014 8:22 AM  Vicki Huynh  has presented today for surgery, with the diagnosis of Menorrhagia, Dysmenorrhea, Dyspareunia  The various methods of treatment have been discussed with the patient and family. After consideration of risks, benefits and other options for treatment, the patient has consented to  Procedure(s) with comments: LAPAROSCOPIC BILATERAL SALPINGECTOMY with removal of Essure coils (Bilateral) - 1hr OR time HYSTEROSCOPY WITH NOVASURE (N/A) as a surgical intervention .  The patient's history has been reviewed, patient examined, no change in status, stable for surgery.  I have reviewed the patient's chart and labs.  Questions were answered to the patient's satisfaction.     Rhyder Bratz D

## 2014-10-09 NOTE — Transfer of Care (Signed)
Immediate Anesthesia Transfer of Care Note  Patient: Vicki Huynh  Procedure(s) Performed: Procedure(s) with comments: LAPAROSCOPIC BILATERAL SALPINGECTOMY with removal of Essure coils (Bilateral) - 1hr OR time HYSTEROSCOPY WITH NOVASURE (N/A)  Patient Location: PACU  Anesthesia Type:General  Level of Consciousness: awake  Airway & Oxygen Therapy: Patient Spontanous Breathing  Post-op Assessment: Report given to PACU RN  Post vital signs: stable  Filed Vitals:   10/09/14 0740  BP: 132/90  Pulse: 91  Temp: 36.5 C  Resp: 20    Complications: No apparent anesthesia complications

## 2014-10-09 NOTE — Anesthesia Procedure Notes (Signed)
Procedure Name: Intubation Date/Time: 10/09/2014 8:50 AM Performed by: Casimer Lanius A Pre-anesthesia Checklist: Patient identified, Emergency Drugs available, Suction available and Patient being monitored Patient Re-evaluated:Patient Re-evaluated prior to inductionOxygen Delivery Method: Circle system utilized and Simple face mask Preoxygenation: Pre-oxygenation with 100% oxygen Intubation Type: IV induction and Inhalational induction Ventilation: Mask ventilation without difficulty Laryngoscope Size: Mac and 3 Grade View: Grade II Tube type: Oral Tube size: 7.0 mm Number of attempts: 1 Airway Equipment and Method: Stylet,  Video-laryngoscopy and Patient positioned with wedge pillow Placement Confirmation: ETT inserted through vocal cords under direct vision,  positive ETCO2 and breath sounds checked- equal and bilateral Secured at: 20 (right lip) cm Tube secured with: Tape Dental Injury: Teeth and Oropharynx as per pre-operative assessment

## 2014-10-09 NOTE — Discharge Instructions (Signed)
Routine instructions for laparoscopy and endometrial ablation  DISCHARGE INSTRUCTIONS: Laparoscopy The following instructions have been prepared to help you care for yourself upon your return home today.  Wound care:  Do not get the incisions wet for the first 24 hours. The incisions should be kept clean and dry.  Thee are no dressings to be removed after surgery.  Should the incision become sore, red, and swollen after the first week, check with your doctor.  Activity and limitations:  Do NOT drive or operate any equipment for 24 hours. The effects of anesthesia are still present and drowsiness may result.  Do NOT lift anything more than 15 pounds for 2-3 weeks after surgery.  Do NOT rest in bed all day.  Walking is encouraged. Walk each day, starting slowly with 5-minute walks 3 or 4 times a day. Slowly increase the length of your walks.  Walk up and down stairs slowly.  Do NOT do strenuous activities, such as golfing, playing tennis, bowling, running, biking, weight lifting, gardening, mowing, or vacuuming for 2-4 weeks. Ask your doctor when it is okay to start.  Diet: Eat a light meal as desired this evening. You may resume your usual diet tomorrow.  Return to work: This is dependent on the type of work you do. For the most part you can return to a desk job within a week of surgery. If you are more active at work, please discuss this with your doctor.  Call your doctor for any of the following:  Develop a fever of 100.4 or greater  Inability to urinate 6 hours after discharge from hospital  Severe pain not relieved by pain medications  Persistent of heavy bleeding at incision site  Redness or swelling around incision site after a week  Excessive vaginal bleeding or clotting, saturating and changing one pad every hour.  Unusual vaginal discharge or odor.  Increasing nausea or vomiting   DISCHARGE INSTRUCTIONS: HYSTEROSCOPY / ENDOMETRIAL ABLATION  May Remove  Scop patch on or before Thursday 7/14  May take Ibuprofen after 3:45 pm this afternoon.  May take stool softner while taking narcotic pain medication to prevent constipation.  Drink plenty of water.  Personal hygiene:  Use sanitary pads for vaginal drainage, not tampons.  Shower the day after your procedure.  NO tub baths, pools or Jacuzzis for 2-3 weeks.  Wipe front to back after using the bathroom.  You may resume your normal activity in one to two days or as indicated by your physician.  Sexual activity: NO intercourse for at least 2 weeks after the procedure, or as indicated by your Doctor.  What to expect after your surgery:  You may have a slight burning sensation when you urinate on the first day.  You may have a very small amount of blood in the urineExpect to have vaginal bleeding/discharge for 2-3 days and spotting for up to 10 days. It is not unusual to have soreness for up to 1-2 weeks. You may have a slight burning sensation when you urinate for the first day. Mild cramps may continue for a couple of days. You may have a regular period in 2-6 weeks.   Return to office _________________Call for an appointment ___________________ Patients signature: ______________________ Nurses signature ________________________  [facility] [phone]

## 2014-10-09 NOTE — Op Note (Signed)
Preoperative diagnosis: Menorrhagia, dysmenorrhea, pelvic pain Postoperative diagnosis: Same Procedure: Laparoscopic bilateral salpingectomy with removal of Essure coils, hysteroscopy with Novasure Surgeon: Cheri Fowler M.D. Anesthesia: Gen. Endotracheal tube Findings: She had a normal abdomen and pelvis with normal uterus tubes and ovaries, normal endometrial cavity.  The Novasure device used a depth of 5 cm, width of 4.6 cm, used 127 Watts for 60 seconds Specimens: Bilateral fallopian tubes and Essure coils Estimated blood loss: Minimal Fluid deficit through hysteroscope:  [id number] Complications: None  Procedure in detail  The patient was taken to the operating room and placed in the dorsosupine position. General anesthesia was induced. Her legs were placed in mobile stirrups and her left arm was tucked to her side. Abdomen perineum and vagina were then prepped and draped in the usual sterile fashion, bladder drained with a Red Robinson catheter, a Hulka tenaculum was applied to the cervix for uterine manipulation. Infraumbilical skin was then infiltrated with quarter percent Marcaine and a 1 cm vertical incision was made. The veress needle was inserted into the peritoneal cavity and placement confirmed by the water drop test and an opening pressure of 7 mm of mercury. CO2 was insufflated to a pressure of 14 mm of mercury and the veress needle was removed. A 58mm disposable trocar was then introduced with direct visualization with the laparoscope. A 5 mm port was then placed on the left side and one low in the midline also under direct visualization. Inspection revealed the above-mentioned findings with normal anatomy. The distal end of each tube was grasped and elevated. Using bipolar cautery I was able to free the distal end of each tube from the ovary and fulgurate across the entire mesosalpinx and the proximal portion of the fallopian tube. Scissors were then used to start freeing the tube. On each  side I was able to free the tube, identify and completely remove the Essure coil after the tube was freed. This is done bilaterally without difficulty. Both segments of tube and the Essure coils were removed through one of the trocars. The left and low midline 5 mm ports were removed under direct visualization. All gas was allowed to deflate from the abdomen and the umbilical trocar was removed. Skin incisions were then closed with interrupted subcuticular sutures of 4-0 Vicryl followed by Liquiband..  Attention was turned vaginally. A Graves speculum was inserted into the vagina and the anterior lip of the cervix was grasped with a single-tooth tenaculum. A deep paracervical block was then performed with a total of 16 cc 2% lidocaine. Uterus then sounded to 9 cm. Cervix was easily dilated to size 23 dilator. The observer hysteroscope was inserted and good visualization was achieved using lactated Ringer's. The endometrial cavity was normal with no fibroids or significant polyps. The hysteroscope was removed. The cervix was further dilated to a size 7 and size 8 Hegar dilator measuring the cervix a 4 cm. The NovaSure device was inserted and deployed properly. The CO2 test passed. Endometrial ablation was performed with the above-mentioned settings without difficulty. The device was then allowed to cool for about 30 seconds and was removed. Hysteroscopy was then performed which revealed good global endometrial ablation and still no lesions. Hysteroscope and fluid were then removed. The single-tooth tenaculum was removed from the cervix. Bleeding was controlled with pressure. All instruments were then removed from the vagina. The patient tolerated the procedure well and was taken to the recovery in stable condition. Counts were correct, she received no antibiotics, she had PAS  hose on throughout the procedure.

## 2014-10-09 NOTE — Anesthesia Postprocedure Evaluation (Signed)
  Anesthesia Post-op Note  Patient: Vicki Huynh  Procedure(s) Performed: Procedure(s) with comments: LAPAROSCOPIC BILATERAL SALPINGECTOMY with removal of Essure coils (Bilateral) - 1hr OR time HYSTEROSCOPY WITH NOVASURE (N/A) Patient is awake and responsive. Pain and nausea are reasonably well controlled. Vital signs are stable and clinically acceptable. Oxygen saturation is clinically acceptable. There are no apparent anesthetic complications at this time. Patient is ready for discharge.

## 2014-10-09 NOTE — Anesthesia Preprocedure Evaluation (Addendum)
Anesthesia Evaluation  Patient identified by MRN, date of birth, ID band Patient awake    Reviewed: Allergy & Precautions, H&P , Patient's Chart, lab work & pertinent test results, reviewed documented beta blocker date and time   Airway Mallampati: III  TM Distance: >3 FB Neck ROM: full    Dental no notable dental hx.    Pulmonary  breath sounds clear to auscultation  Pulmonary exam normal       Cardiovascular Rhythm:regular Rate:Normal     Neuro/Psych    GI/Hepatic   Endo/Other  diabetes  Renal/GU      Musculoskeletal   Abdominal   Peds  Hematology   Anesthesia Other Findings   Reproductive/Obstetrics                            Anesthesia Physical Anesthesia Plan  ASA: II  Anesthesia Plan: General   Post-op Pain Management:    Induction: Intravenous  Airway Management Planned: Oral ETT and Video Laryngoscope Planned  Additional Equipment:   Intra-op Plan:   Post-operative Plan: Extubation in OR  Informed Consent: I have reviewed the patients History and Physical, chart, labs and discussed the procedure including the risks, benefits and alternatives for the proposed anesthesia with the patient or authorized representative who has indicated his/her understanding and acceptance.   Dental Advisory Given and Dental advisory given  Plan Discussed with: CRNA and Surgeon  Anesthesia Plan Comments: (  Discussed general anesthesia, including possible nausea, instrumentation of airway, sore throat,pulmonary aspiration, etc. I asked if the were any outstanding questions, or  concerns before we proceeded. )       Anesthesia Quick Evaluation

## 2014-10-10 ENCOUNTER — Encounter (HOSPITAL_COMMUNITY): Payer: Self-pay | Admitting: Obstetrics and Gynecology

## 2015-12-30 ENCOUNTER — Emergency Department (HOSPITAL_COMMUNITY): Payer: Medicaid Other

## 2015-12-30 ENCOUNTER — Encounter (HOSPITAL_COMMUNITY): Payer: Self-pay

## 2015-12-30 ENCOUNTER — Emergency Department (HOSPITAL_COMMUNITY)
Admission: EM | Admit: 2015-12-30 | Discharge: 2015-12-31 | Disposition: A | Discharge status: Home / Self Care | Payer: Medicaid Other | Attending: Emergency Medicine | Admitting: Emergency Medicine

## 2015-12-30 DIAGNOSIS — A5903 Trichomonal cystitis and urethritis: Secondary | ICD-10-CM | POA: Diagnosis not present

## 2015-12-30 DIAGNOSIS — K8020 Calculus of gallbladder without cholecystitis without obstruction: Secondary | ICD-10-CM | POA: Diagnosis not present

## 2015-12-30 DIAGNOSIS — R1032 Left lower quadrant pain: Secondary | ICD-10-CM | POA: Diagnosis present

## 2015-12-30 DIAGNOSIS — R1084 Generalized abdominal pain: Secondary | ICD-10-CM

## 2015-12-30 HISTORY — DX: Anxiety disorder, unspecified: F41.9

## 2015-12-30 LAB — URINALYSIS, ROUTINE W REFLEX MICROSCOPIC
Bilirubin Urine: NEGATIVE
Glucose, UA: >1000 mg/dL — AB
HGB URINE DIPSTICK: NEGATIVE
Ketones, ur: NEGATIVE mg/dL
Leukocytes, UA: NEGATIVE
Nitrite: NEGATIVE
PROTEIN: 30 mg/dL — AB
Specific Gravity, Urine: 1.045 — ABNORMAL HIGH (ref 1.005–1.030)
pH: 6.5 (ref 5.0–8.0)

## 2015-12-30 LAB — URINE MICROSCOPIC-ADD ON
Bacteria, UA: NONE SEEN
RBC / HPF: NONE SEEN RBC/hpf (ref 0–5)

## 2015-12-30 LAB — CBC
HCT: 42.6 % (ref 36.0–46.0)
HEMOGLOBIN: 15.1 g/dL — AB (ref 12.0–15.0)
MCH: 30.1 pg (ref 26.0–34.0)
MCHC: 35.4 g/dL (ref 30.0–36.0)
MCV: 84.9 fL (ref 78.0–100.0)
PLATELETS: 356 10*3/uL (ref 150–400)
RBC: 5.02 MIL/uL (ref 3.87–5.11)
RDW: 12.5 % (ref 11.5–15.5)
WBC: 10.4 10*3/uL (ref 4.0–10.5)

## 2015-12-30 LAB — COMPREHENSIVE METABOLIC PANEL
ALK PHOS: 103 U/L (ref 38–126)
ALT: 103 U/L — AB (ref 14–54)
AST: 51 U/L — ABNORMAL HIGH (ref 15–41)
Albumin: 4.7 g/dL (ref 3.5–5.0)
Anion gap: 9 (ref 5–15)
BILIRUBIN TOTAL: 1 mg/dL (ref 0.3–1.2)
BUN: 12 mg/dL (ref 6–20)
CO2: 26 mmol/L (ref 22–32)
CREATININE: 0.62 mg/dL (ref 0.44–1.00)
Calcium: 9.6 mg/dL (ref 8.9–10.3)
Chloride: 101 mmol/L (ref 101–111)
GFR calc non Af Amer: >60 mL/min (ref >60)
Glucose, Bld: 327 mg/dL — ABNORMAL HIGH (ref 65–99)
Potassium: 3.9 mmol/L (ref 3.5–5.1)
Sodium: 136 mmol/L (ref 135–145)
TOTAL PROTEIN: 8.5 g/dL — AB (ref 6.5–8.1)

## 2015-12-30 LAB — LIPASE, BLOOD: Lipase: 27 U/L (ref 11–51)

## 2015-12-30 LAB — I-STAT BETA HCG BLOOD, ED (MC, WL, AP ONLY)

## 2015-12-30 LAB — WET PREP, GENITAL
CLUE CELLS WET PREP: NONE SEEN
Sperm: NONE SEEN
Trich, Wet Prep: NONE SEEN
Yeast Wet Prep HPF POC: NONE SEEN

## 2015-12-30 MED ORDER — METRONIDAZOLE 500 MG PO TABS: 2000.0000 mg | ORAL_TABLET | Freq: Once | ORAL | Status: DC

## 2015-12-30 MED ORDER — METRONIDAZOLE 500 MG PO TABS
500.0000 mg | ORAL_TABLET | Freq: Once | ORAL | Status: AC
  Administered 2015-12-30: 500 mg via ORAL
  Filled 2015-12-30: qty 1

## 2015-12-30 MED ORDER — METRONIDAZOLE 500 MG PO TABS: 500.0000 mg | ORAL_TABLET | Freq: Two times a day (BID) | ORAL | 0 refills | Status: DC

## 2015-12-30 MED ORDER — MORPHINE SULFATE (PF) 4 MG/ML IV SOLN
4.0000 mg | Freq: Once | INTRAVENOUS | Status: AC
  Administered 2015-12-30: 4 mg via INTRAVENOUS
  Filled 2015-12-30: qty 1

## 2015-12-30 MED ORDER — LIDOCAINE HCL 1 % IJ SOLN
INTRAMUSCULAR | Status: AC
  Administered 2015-12-30: 20 mL
  Filled 2015-12-30: qty 20

## 2015-12-30 MED ORDER — OXYCODONE-ACETAMINOPHEN 5-325 MG PO TABS: 1.0000 | ORAL_TABLET | Freq: Three times a day (TID) | ORAL | 0 refills | Status: DC | PRN

## 2015-12-30 MED ORDER — SODIUM CHLORIDE 0.9 % IV BOLUS (SEPSIS)
1000.0000 mL | Freq: Once | INTRAVENOUS | Status: AC
  Administered 2015-12-30: 1000 mL via INTRAVENOUS

## 2015-12-30 MED ORDER — ONDANSETRON 4 MG PO TBDP: 4.0000 mg | ORAL_TABLET | Freq: Three times a day (TID) | ORAL | 0 refills | Status: DC | PRN

## 2015-12-30 MED ORDER — ONDANSETRON HCL 4 MG/2ML IJ SOLN
4.0000 mg | Freq: Once | INTRAMUSCULAR | Status: AC
  Administered 2015-12-30: 4 mg via INTRAVENOUS
  Filled 2015-12-30: qty 2

## 2015-12-30 MED ORDER — AZITHROMYCIN 250 MG PO TABS
1000.0000 mg | ORAL_TABLET | Freq: Once | ORAL | Status: AC
  Administered 2015-12-30: 1000 mg via ORAL
  Filled 2015-12-30: qty 4

## 2015-12-30 MED ORDER — CEFTRIAXONE SODIUM 250 MG IJ SOLR
250.0000 mg | Freq: Once | INTRAMUSCULAR | Status: AC
  Administered 2015-12-30: 250 mg via INTRAMUSCULAR
  Filled 2015-12-30: qty 250

## 2015-12-30 NOTE — ED Notes (Signed)
Pt still at Ultrasound

## 2015-12-30 NOTE — ED Provider Notes (Signed)
[facility] DEPT Provider Note   CSN: [id number] Arrival date & time: 12/30/15  1731     History   Chief Complaint Chief Complaint  Patient presents with  . Abdominal Pain  . Nausea    HPI Vicki Huynh is a 35 y.o. female.  HPI   Patient is a 35 year old female with history of anxiety, depression and gestational diabetes, status post tubal ligation, she presents to emergency department with 2 days of worsening suprapubic and left lower quadrant abdominal pain.  2 days ago and the pain began gradually worsened and was associated with some bilateral flank pain and some radiation of pain up the center of her abdomen to her epigastric area. She felt a little better yesterday and then today felt much worse. Pain is described as sharp and stabbing, most severe in her suprapubic area and left lower quadrant. Today it is radiating across her low abdomen and up to her epigastrium, somewhat less radiation to her flank. No radiation to her groin area.  She is experiencing dysuria and has associated nausea. She denies fever, chills, sweats, hematuria.  She had normal vaginal discharge over the past couple days which is clear and slightly increased in volume which is common for her 1-2 weeks before her period which will be next week she states.  No vaginal rash or irritation, no dyspareunia.  She is married for 9 years, only partner is her husband.  She had ovarian cyst in the past but is unsure if this is consistent with ovarian cyst pain.  She did have some increase in her abdominal pain earlier today when she attempted to eat.  She has no change in her bowel movements but states that her low abdominal pain is increased when attempting to have a bowel movement she denies diarrhea or constipation.    Hx of endometrial ablation, states her periods are somewhat irregular although she tends to have them the second week to month, lasting one week, with intermittent bleeding and spotting.   OB History     Gravida Para Term Preterm AB Living   4 2 2   2 2    SAB TAB Ectopic Multiple Live Births   1 1             Past Medical History:  Diagnosis Date  . Anxiety   . Depression   . Diabetic mellitus, gestational     Patient Active Problem List   Diagnosis Date Noted  . Menorrhagia with regular cycle 10/09/2014  . Dysmenorrhea 10/09/2014  . Pelvic pain in female 10/09/2014  . 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 Pain in joint, upper arm 02/06/2014  . Paresthesias 02/06/2014    Past Surgical History:  Procedure Laterality Date  . HYSTEROSCOPY WITH NOVASURE N/A 10/09/2014   Procedure: HYSTEROSCOPY WITH NOVASURE;  Surgeon: Cheri Fowler, MD;  Location: [facility];  Service: Gynecology;  Laterality: N/A;  . LAPAROSCOPIC BILATERAL SALPINGECTOMY Bilateral 10/09/2014   Procedure: LAPAROSCOPIC BILATERAL SALPINGECTOMY with removal of Essure coils;  Surgeon: Cheri Fowler, MD;  Location: [facility];  Service: Gynecology;  Laterality: Bilateral;  1hr OR time  . WISDOM TOOTH EXTRACTION      OB History    Gravida Para Term Preterm AB Living   4 2 2   2 2    SAB TAB Ectopic Multiple Live Births   1 1             Home Medications    Prior to Admission medications   Medication Sig Start  Date End Date Taking? Authorizing Provider  venlafaxine XR (EFFEXOR-XR) 150 MG 24 hr capsule Take 150 mg by mouth daily with breakfast.   Yes Historical Provider, MD    Family History Family History  Problem Relation Age of Onset  . Rheum arthritis Mother   . Rheum arthritis Father     Social History Social History  Substance Use Topics  . Smoking status: Never Smoker  . Smokeless tobacco: Never Used  . Alcohol use No     Allergies   Wool alcohol [lanolin alcohol] and Pertussis vaccines   Review of Systems Review of Systems  All other systems reviewed and are negative.    Physical Exam Updated Vital Signs BP 132/94 (BP Location: Left Arm)   Pulse 78   Temp 98.2 F (36.8 C) (Oral)   Resp  20   LMP 12/10/2015   SpO2 100%   Physical Exam  Constitutional: She is oriented to person, place, and time. She appears well-developed and well-nourished. No distress.  HENT:  Head: Normocephalic and atraumatic.  Right Ear: External ear normal.  Left Ear: External ear normal.  Nose: Nose normal.  Mouth/Throat: Oropharynx is clear and moist. No oropharyngeal exudate.  Eyes: Conjunctivae and EOM are normal. Pupils are equal, round, and reactive to light. Right eye exhibits no discharge. Left eye exhibits no discharge. No scleral icterus.  Neck: Normal range of motion. No JVD present. No tracheal deviation present.  Cardiovascular: Normal rate, regular rhythm, normal heart sounds and intact distal pulses.  Exam reveals no gallop and no friction rub.   No murmur heard. Pulmonary/Chest: Effort normal and breath sounds normal. No stridor. No respiratory distress. She has no wheezes. She has no rales. She exhibits no tenderness.  Abdominal: Soft. Normal appearance and bowel sounds are normal. She exhibits no distension and no mass. There is tenderness. There is guarding. There is no rigidity, no rebound and no tenderness at McBurney's point. Hernia confirmed negative in the right inguinal area and confirmed negative in the left inguinal area.    Obese abdomen, soft, non-distended.  Tender to light percussion.  Voluntary guarding with palpation to RUQ, epigastrum, LUQ, LLQ and suprapubic area.   Genitourinary: There is no rash on the right labia. There is no rash on the left labia. Cervix exhibits no motion tenderness. Right adnexum displays no mass. Left adnexum displays no mass. No erythema or bleeding in the vagina. No signs of injury around the vagina.  Musculoskeletal: Normal range of motion. She exhibits no edema.  Lymphadenopathy:    She has no cervical adenopathy.       Right: No inguinal adenopathy present.       Left: No inguinal adenopathy present.  Neurological: She is alert and  oriented to person, place, and time. She exhibits normal muscle tone. Coordination normal.  Skin: Skin is warm and dry. No rash noted. She is not diaphoretic. No erythema. No pallor.  Psychiatric: She has a normal mood and affect. Her behavior is normal. Judgment and thought content normal.  Nursing note and vitals reviewed.    ED Treatments / Results  Labs (all labs ordered are listed, but only abnormal results are displayed) Labs Reviewed  COMPREHENSIVE METABOLIC PANEL - Abnormal; Notable for the following:       Result Value   Glucose, Bld 327 (*)    Total Protein 8.5 (*)    AST 51 (*)    ALT 103 (*)    All other components within normal limits  CBC - Abnormal; Notable for the following:    Hemoglobin 15.1 (*)    All other components within normal limits  URINALYSIS, ROUTINE W REFLEX MICROSCOPIC (NOT AT [facility]) - Abnormal; Notable for the following:    APPearance CLOUDY (*)    Specific Gravity, Urine 1.045 (*)    Glucose, UA >1000 (*)    Protein, ur 30 (*)    All other components within normal limits  URINE MICROSCOPIC-ADD ON - Abnormal; Notable for the following:    Squamous Epithelial / LPF 6-30 (*)    All other components within normal limits  WET PREP, GENITAL  LIPASE, BLOOD  I-STAT BETA HCG BLOOD, ED ([facility], [facility], [facility] ONLY)  GC/CHLAMYDIA PROBE AMP ([place]) NOT AT [facility]    EKG  EKG Interpretation None      Results for orders placed or performed during the hospital encounter of 12/30/15  Wet prep, genital  Result Value Ref Range   Yeast Wet Prep HPF POC NONE SEEN NONE SEEN   Trich, Wet Prep NONE SEEN NONE SEEN   Clue Cells Wet Prep HPF POC NONE SEEN NONE SEEN   WBC, Wet Prep HPF POC FEW (A) NONE SEEN   Sperm NONE SEEN   Lipase, blood  Result Value Ref Range   Lipase 27 11 - 51 U/L  Comprehensive metabolic panel  Result Value Ref Range   Sodium 136 135 - 145 mmol/L   Potassium 3.9 3.5 - 5.1 mmol/L   Chloride 101 101 - 111 mmol/L   CO2 26 22 - 32 mmol/L    Glucose, Bld 327 (H) 65 - 99 mg/dL   BUN 12 6 - 20 mg/dL   Creatinine, Ser 0.62 0.44 - 1.00 mg/dL   Calcium 9.6 8.9 - 10.3 mg/dL   Total Protein 8.5 (H) 6.5 - 8.1 g/dL   Albumin 4.7 3.5 - 5.0 g/dL   AST 51 (H) 15 - 41 U/L   ALT 103 (H) 14 - 54 U/L   Alkaline Phosphatase 103 38 - 126 U/L   Total Bilirubin 1.0 0.3 - 1.2 mg/dL   GFR calc non Af Amer >60 >60 mL/min   GFR calc Af Amer >60 >60 mL/min   Anion gap 9 5 - 15  CBC  Result Value Ref Range   WBC 10.4 4.0 - 10.5 K/uL   RBC 5.02 3.87 - 5.11 MIL/uL   Hemoglobin 15.1 (H) 12.0 - 15.0 g/dL   HCT 42.6 36.0 - 46.0 %   MCV 84.9 78.0 - 100.0 fL   MCH 30.1 26.0 - 34.0 pg   MCHC 35.4 30.0 - 36.0 g/dL   RDW 12.5 11.5 - 15.5 %   Platelets 356 150 - 400 K/uL  Urinalysis, Routine w reflex microscopic  Result Value Ref Range   Color, Urine YELLOW YELLOW   APPearance CLOUDY (A) CLEAR   Specific Gravity, Urine 1.045 (H) 1.005 - 1.030   pH 6.5 5.0 - 8.0   Glucose, UA >1000 (A) NEGATIVE mg/dL   Hgb urine dipstick NEGATIVE NEGATIVE   Bilirubin Urine NEGATIVE NEGATIVE   Ketones, ur NEGATIVE NEGATIVE mg/dL   Protein, ur 30 (A) NEGATIVE mg/dL   Nitrite NEGATIVE NEGATIVE   Leukocytes, UA NEGATIVE NEGATIVE  Urine microscopic-add on  Result Value Ref Range   Squamous Epithelial / LPF 6-30 (A) NONE SEEN   WBC, UA 0-5 0 - 5 WBC/hpf   RBC / HPF NONE SEEN 0 - 5 RBC/hpf   Bacteria, UA NONE SEEN NONE SEEN  Trichomonas, UA PRESENT   I-Stat beta hCG blood, ED  Result Value Ref Range   I-stat hCG, quantitative <5.0 <5 mIU/mL   Comment 3          GC/Chlamydia probe amp ([place])not at [facility]  Result Value Ref Range   Chlamydia Negative    Neisseria gonorrhea Negative    US Transvaginal Non-ob  Result Date: 12/30/2015 CLINICAL DATA:  35 year old female with left lower quadrant abdominal pain, nausea. History of tubal ligation. EXAM: TRANSABDOMINAL AND TRANSVAGINAL ULTRASOUND OF PELVIS DOPPLER ULTRASOUND OF OVARIES TECHNIQUE: Both  transabdominal and transvaginal ultrasound examinations of the pelvis were performed. Transabdominal technique was performed for global imaging of the pelvis including uterus, ovaries, adnexal regions, and pelvic cul-de-sac. It was necessary to proceed with endovaginal exam following the transabdominal exam to visualize the endometrium and the ovaries. Color and duplex Doppler ultrasound was utilized to evaluate blood flow to the ovaries. COMPARISON:  Pelvic ultrasound dated 01/22/2011 FINDINGS: Uterus Measurements: 9.7 x 5.4 x 6.3 cm. No fibroids or other mass visualized. Endometrium Thickness: 6 mm.  No focal abnormality visualized. Right ovary Measurements: 3.5 x 2.3 x 2.4 cm. Normal appearance/no adnexal mass. Left ovary Measurements: 3.5 x 1.3 x 2.2 cm. Normal appearance/no adnexal mass. Pulsed Doppler evaluation of both ovaries demonstrates normal low-resistance arterial and venous waveforms. Other findings No abnormal free fluid. IMPRESSION: Unremarkable pelvic ultrasound. Electronically Signed   By: Anner Crete M.D.   On: 12/30/2015 23:00   US Pelvis Complete  Result Date: 12/30/2015 CLINICAL DATA:  35 year old female with left lower quadrant abdominal pain, nausea. History of tubal ligation. EXAM: TRANSABDOMINAL AND TRANSVAGINAL ULTRASOUND OF PELVIS DOPPLER ULTRASOUND OF OVARIES TECHNIQUE: Both transabdominal and transvaginal ultrasound examinations of the pelvis were performed. Transabdominal technique was performed for global imaging of the pelvis including uterus, ovaries, adnexal regions, and pelvic cul-de-sac. It was necessary to proceed with endovaginal exam following the transabdominal exam to visualize the endometrium and the ovaries. Color and duplex Doppler ultrasound was utilized to evaluate blood flow to the ovaries. COMPARISON:  Pelvic ultrasound dated 01/22/2011 FINDINGS: Uterus Measurements: 9.7 x 5.4 x 6.3 cm. No fibroids or other mass visualized. Endometrium Thickness: 6 mm.  No  focal abnormality visualized. Right ovary Measurements: 3.5 x 2.3 x 2.4 cm. Normal appearance/no adnexal mass. Left ovary Measurements: 3.5 x 1.3 x 2.2 cm. Normal appearance/no adnexal mass. Pulsed Doppler evaluation of both ovaries demonstrates normal low-resistance arterial and venous waveforms. Other findings No abnormal free fluid. IMPRESSION: Unremarkable pelvic ultrasound. Electronically Signed   By: Anner Crete M.D.   On: 12/30/2015 23:00   [place] Art/ven Flow Abd Pelv Doppler  Result Date: 12/30/2015 CLINICAL DATA:  35 year old female with left lower quadrant abdominal pain, nausea. History of tubal ligation. EXAM: TRANSABDOMINAL AND TRANSVAGINAL ULTRASOUND OF PELVIS DOPPLER ULTRASOUND OF OVARIES TECHNIQUE: Both transabdominal and transvaginal ultrasound examinations of the pelvis were performed. Transabdominal technique was performed for global imaging of the pelvis including uterus, ovaries, adnexal regions, and pelvic cul-de-sac. It was necessary to proceed with endovaginal exam following the transabdominal exam to visualize the endometrium and the ovaries. Color and duplex Doppler ultrasound was utilized to evaluate blood flow to the ovaries. COMPARISON:  Pelvic ultrasound dated 01/22/2011 FINDINGS: Uterus Measurements: 9.7 x 5.4 x 6.3 cm. No fibroids or other mass visualized. Endometrium Thickness: 6 mm.  No focal abnormality visualized. Right ovary Measurements: 3.5 x 2.3 x 2.4 cm. Normal appearance/no adnexal mass. Left ovary Measurements: 3.5 x 1.3 x 2.2 cm. Normal  appearance/no adnexal mass. Pulsed Doppler evaluation of both ovaries demonstrates normal low-resistance arterial and venous waveforms. Other findings No abnormal free fluid. IMPRESSION: Unremarkable pelvic ultrasound. Electronically Signed   By: Anner Crete M.D.   On: 12/30/2015 23:00   US Abdomen Limited Ruq  Result Date: 12/30/2015 CLINICAL DATA:  Right upper quadrant abdominal pain. EXAM: US ABDOMEN LIMITED - RIGHT UPPER  QUADRANT COMPARISON:  None. FINDINGS: Gallbladder: Multiple gallstones are noted, with the largest measuring 1.6 cm. No gallbladder wall thickening or pericholecystic fluid is noted. No sonographic Murphy's sign is noted. Common bile duct: Diameter: 3.2 mm which is within normal limits. Liver: No focal lesion identified. Increased echogenicity is noted consistent with fatty infiltration. IMPRESSION: Cholelithiasis without gallbladder wall thickening or pericholecystic fluid. If there is clinical concern for cholecystitis, HIDA scan may be performed for further evaluation. Fatty infiltration of the liver. Electronically Signed   By: Marijo Conception, M.D.   On: 12/30/2015 21:52     Radiology No results found.  Procedures Procedures (including critical care time)  Medications Ordered in ED Medications  azithromycin (ZITHROMAX) tablet 1,000 mg (not administered)  cefTRIAXone (ROCEPHIN) injection 250 mg (not administered)  metroNIDAZOLE (FLAGYL) tablet 500 mg (not administered)  sodium chloride 0.9 % bolus 1,000 mL (1,000 mLs Intravenous New Bag/Given 12/30/15 1857)  ondansetron (ZOFRAN) injection 4 mg (4 mg Intravenous Given 12/30/15 1857)  morphine 4 MG/ML injection 4 mg (4 mg Intravenous Given 12/30/15 1857)     Initial Impression / Assessment and Plan / ED Course  I have reviewed the triage vital signs and the nursing notes.  Pertinent labs & imaging results that were available during my care of the patient were reviewed by me and considered in my medical decision making (see chart for details).  Clinical Course   Pt with abdominal pain in RUQ, epigastrum, LLQ with urinary sx and some radiation to back and flank, no bowel changes, some of the pain is colicky in the upper abdomen, lower abdominal pain is more constant.  Low risk for STD's, however basic labs obtained and UA shows + trich. RUQ [place] and pelvic US  Ordered.  Pt treated for STD's. Pelvic US negative.  RUQ [place] + for chololithiasis,  no evidence of cholecystitis, discussed outpt follow up.  Pt pain managed in the ER, able to tolerate PO's.  Pt needs to follow up with her PCP regarding todays complaints, and also blood sugar.  Discharged home in good condition with VSS.  Final Clinical Impressions(s) / ED Diagnoses   Final diagnoses:  Generalized abdominal pain  Calculus of gallbladder without cholecystitis without obstruction  Trichomonal urethritis    New Prescriptions Discharge Medication List as of 12/30/2015 11:26 PM    START taking these medications   Details  metroNIDAZOLE (FLAGYL) 500 MG tablet Take 1 tablet (500 mg total) by mouth 2 (two) times daily., Starting Sun 12/30/2015, Print    ondansetron (ZOFRAN ODT) 4 MG disintegrating tablet Take 1 tablet (4 mg total) by mouth every 8 (eight) hours as needed for nausea or vomiting., Starting Sun 12/30/2015, Print    oxyCODONE-acetaminophen (PERCOCET/ROXICET) 5-325 MG tablet Take 1-2 tablets by mouth every 8 (eight) hours as needed for severe pain., Starting Sun 12/30/2015, Print         Delsa Grana, PA-C 01/16/16 [id number]    Gareth Morgan, MD 01/17/16 1535

## 2015-12-30 NOTE — ED Notes (Signed)
Pt transported to Ultrasound.  

## 2015-12-30 NOTE — ED Triage Notes (Signed)
Pt c/o LLQ pain x 2 days and nausea, dysuria, and low back pain starting today.  Pain score 8/10.  Pt has not taken anything for pain.  Pt, also, reports "painful" BM earlier.  Denies diarrhea.

## 2015-12-31 LAB — GC/CHLAMYDIA PROBE AMP (~~LOC~~) NOT AT ARMC
CHLAMYDIA, DNA PROBE: NEGATIVE
NEISSERIA GONORRHEA: NEGATIVE

## 2016-01-31 ENCOUNTER — Encounter: Payer: Medicaid Other | Attending: Family Medicine | Admitting: *Deleted

## 2016-01-31 DIAGNOSIS — E119 Type 2 diabetes mellitus without complications: Secondary | ICD-10-CM | POA: Insufficient documentation

## 2016-01-31 DIAGNOSIS — Z713 Dietary counseling and surveillance: Secondary | ICD-10-CM | POA: Diagnosis not present

## 2016-01-31 NOTE — Patient Instructions (Signed)
Plan:  Aim for 3 Carb Choices per meal (45 grams) +/- 1 either way  Aim for 0-1 Carbs per snack if hungry  Include protein in moderation with your meals and snacks Consider reading food labels for Total Carbohydrate of foods Continue with your activity level by walking for 30 minutes daily as tolerated Continue checking BG at alternate times per day  Continue taking medication as directed by MD Let your MD know when your BG is down in the 80's or if you start having low BG (below 70 mg/dl) so they can decrease your Glimepiride medication

## 2016-01-31 NOTE — Progress Notes (Signed)
Diabetes Self-Management Education  Visit Type: First/Initial  Appt. Start Time: 0930 Appt. End Time: 1100  01/31/2016  [name], identified by name and date of birth, is a 35 y.o. female with a diagnosis of Diabetes: Type 2. She states she had GDM with both pregnances 6 and 10 years ago. She is knowledgeable of Carb Counting but needs to know how many she needs at each meal.   ASSESSMENT  Height 5\' 7"  (1.702 m), weight 214 lb 4.8 oz (97.2 kg). Body mass index is 33.56 kg/m.      Diabetes Self-Management Education - 01/31/16 0927      Visit Information   Visit Type First/Initial     Initial Visit   Diabetes Type Type 2   Are you currently following a meal plan? No   Are you taking your medications as prescribed? Yes   Date Diagnosed 01/03/2016     Health Coping   How would you rate your overall health? Good     Psychosocial Assessment   Patient Belief/Attitude about Diabetes Motivated to manage diabetes   Self-management support Friends;Family;Church   Other persons present Patient   Patient Concerns Nutrition/Meal planning   Preferred Learning Style Hands on;Auditory   How often do you need to have someone help you when you read instructions, pamphlets, or other written materials from your doctor or pharmacy? 1 - Never   What is the last grade level you completed in school? 12     Pre-Education Assessment   Patient understands the diabetes disease and treatment process. Needs Instruction   Patient understands incorporating nutritional management into lifestyle. Needs Review   Patient undertands incorporating physical activity into lifestyle. Needs Review   Patient understands using medications safely. Needs Instruction   Patient understands monitoring blood glucose, interpreting and using results Needs Instruction   Patient understands prevention, detection, and treatment of acute complications. Needs Instruction   Patient understands prevention, detection,  and treatment of chronic complications. Needs Instruction   Patient understands how to develop strategies to address psychosocial issues. Needs Review   Patient understands how to develop strategies to promote health/change behavior. Needs Review     Complications   Last HgB A1C per patient/outside source 11 %   How often do you check your blood sugar? 1-2 times/day   Fasting Blood glucose range (mg/dL) 70-129   Postprandial Blood glucose range (mg/dL) 180-200;130-179   Number of hypoglycemic episodes per month 0   Have you had a dilated eye exam in the past 12 months? Yes   Have you had a dental exam in the past 12 months? No   Are you checking your feet? No     Dietary Intake   Breakfast unsweetened cereal, whole milk for now OR flavored oatmeal and fresh fruit   Snack (morning) raisins OR fresh fruit   Lunch sandwich OR Healthy Choice frozen meal OR salad with meat    Snack (afternoon) occasionally, if hungry   Dinner meat, starch and extra vegetables, occasionally small piece of bread   Snack (evening) granola bar (carb and protein) OR 1/2 tuna sandwich   Beverage(s) milk, water, V-8 diet Splash (stopped all sodas 2 weeks ago)     Exercise   Exercise Type Light (walking / raking leaves)   How many days per week to you exercise? 7   How many minutes per day do you exercise? 30   Total minutes per week of exercise 210     Patient Education  Previous Diabetes Education Yes (please comment)  2010   Disease state  Definition of diabetes, type 1 and 2, and the diagnosis of diabetes   Nutrition management  Role of diet in the treatment of diabetes and the relationship between the three main macronutrients and blood glucose level;Food label reading, portion sizes and measuring food.;Carbohydrate counting   Physical activity and exercise  Role of exercise on diabetes management, blood pressure control and cardiac health.   Medications Reviewed patients medication for diabetes, action,  purpose, timing of dose and side effects.   Monitoring Identified appropriate SMBG and/or A1C goals.   Acute complications Taught treatment of hypoglycemia - the 15 rule.   Chronic complications Relationship between chronic complications and blood glucose control   Psychosocial adjustment Role of stress on diabetes     Individualized Goals (developed by patient)   Nutrition Follow meal plan discussed   Physical Activity Exercise 5-7 days per week   Medications take my medication as prescribed   Monitoring  test blood glucose pre and post meals as discussed     Post-Education Assessment   Patient understands the diabetes disease and treatment process. Demonstrates understanding / competency   Patient understands incorporating nutritional management into lifestyle. Demonstrates understanding / competency   Patient undertands incorporating physical activity into lifestyle. Demonstrates understanding / competency   Patient understands using medications safely. Demonstrates understanding / competency   Patient understands monitoring blood glucose, interpreting and using results Demonstrates understanding / competency   Patient understands prevention, detection, and treatment of acute complications. Demonstrates understanding / competency   Patient understands prevention, detection, and treatment of chronic complications. Demonstrates understanding / competency   Patient understands how to develop strategies to address psychosocial issues. Demonstrates understanding / competency   Patient understands how to develop strategies to promote health/change behavior. Demonstrates understanding / competency     Outcomes   Expected Outcomes Demonstrated interest in learning. Expect positive outcomes   Future DMSE PRN   Program Status Completed      Individualized Plan for Diabetes Self-Management Training:   Learning Objective:  Patient will have a greater understanding of diabetes  self-management. Patient education plan is to attend individual and/or group sessions per assessed needs and concerns.   Plan:   Patient Instructions  Plan:  Aim for 3 Carb Choices per meal (45 grams) +/- 1 either way  Aim for 0-1 Carbs per snack if hungry  Include protein in moderation with your meals and snacks Consider reading food labels for Total Carbohydrate of foods Continue with your activity level by walking for 30 minutes daily as tolerated Continue checking BG at alternate times per day  Continue taking medication as directed by MD Let your MD know when your BG is down in the 80's or if you start having low BG (below 70 mg/dl) so they can decrease your Glimepiride medication      Expected Outcomes:  Demonstrated interest in learning. Expect positive outcomes  Education material provided: Living Well with Diabetes, A1C conversion sheet, Meal plan card, Snack sheet, Support group flyer and Carbohydrate counting sheet  If problems or questions, patient to contact team via:  Phone and Email  Future DSME appointment: PRN

## 2016-02-14 ENCOUNTER — Emergency Department (HOSPITAL_COMMUNITY): Payer: Medicaid Other

## 2016-02-14 ENCOUNTER — Emergency Department (HOSPITAL_COMMUNITY)
Admission: EM | Admit: 2016-02-14 | Discharge: 2016-02-14 | Disposition: A | Discharge status: Home / Self Care | Payer: Medicaid Other | Attending: Emergency Medicine | Admitting: Emergency Medicine

## 2016-02-14 ENCOUNTER — Encounter (HOSPITAL_COMMUNITY): Payer: Self-pay | Admitting: Emergency Medicine

## 2016-02-14 DIAGNOSIS — R1013 Epigastric pain: Secondary | ICD-10-CM | POA: Insufficient documentation

## 2016-02-14 DIAGNOSIS — R1011 Right upper quadrant pain: Secondary | ICD-10-CM | POA: Diagnosis present

## 2016-02-14 DIAGNOSIS — K8050 Calculus of bile duct without cholangitis or cholecystitis without obstruction: Secondary | ICD-10-CM

## 2016-02-14 DIAGNOSIS — K8020 Calculus of gallbladder without cholecystitis without obstruction: Secondary | ICD-10-CM | POA: Diagnosis not present

## 2016-02-14 DIAGNOSIS — Z7984 Long term (current) use of oral hypoglycemic drugs: Secondary | ICD-10-CM | POA: Insufficient documentation

## 2016-02-14 LAB — URINE MICROSCOPIC-ADD ON: RBC / HPF: NONE SEEN RBC/hpf (ref 0–5)

## 2016-02-14 LAB — COMPREHENSIVE METABOLIC PANEL
ALK PHOS: 86 U/L (ref 38–126)
ALT: 63 U/L — AB (ref 14–54)
AST: 37 U/L (ref 15–41)
Albumin: 4.4 g/dL (ref 3.5–5.0)
Anion gap: 7 (ref 5–15)
BILIRUBIN TOTAL: 0.8 mg/dL (ref 0.3–1.2)
BUN: 16 mg/dL (ref 6–20)
CALCIUM: 9.4 mg/dL (ref 8.9–10.3)
CO2: 26 mmol/L (ref 22–32)
CREATININE: 0.63 mg/dL (ref 0.44–1.00)
Chloride: 105 mmol/L (ref 101–111)
Glucose, Bld: 168 mg/dL — ABNORMAL HIGH (ref 65–99)
Potassium: 3.8 mmol/L (ref 3.5–5.1)
Sodium: 138 mmol/L (ref 135–145)
Total Protein: 7.9 g/dL (ref 6.5–8.1)

## 2016-02-14 LAB — URINALYSIS, ROUTINE W REFLEX MICROSCOPIC
Bilirubin Urine: NEGATIVE
GLUCOSE, UA: NEGATIVE mg/dL
HGB URINE DIPSTICK: NEGATIVE
KETONES UR: NEGATIVE mg/dL
LEUKOCYTES UA: NEGATIVE
Nitrite: NEGATIVE
PROTEIN: 30 mg/dL — AB
Specific Gravity, Urine: 1.029 (ref 1.005–1.030)
pH: 6 (ref 5.0–8.0)

## 2016-02-14 LAB — CBC
HCT: 41 % (ref 36.0–46.0)
Hemoglobin: 14.4 g/dL (ref 12.0–15.0)
MCH: 29.8 pg (ref 26.0–34.0)
MCHC: 35.1 g/dL (ref 30.0–36.0)
MCV: 84.9 fL (ref 78.0–100.0)
PLATELETS: 337 10*3/uL (ref 150–400)
RBC: 4.83 MIL/uL (ref 3.87–5.11)
RDW: 12.3 % (ref 11.5–15.5)
WBC: 6 10*3/uL (ref 4.0–10.5)

## 2016-02-14 LAB — CBG MONITORING, ED: GLUCOSE-CAPILLARY: 79 mg/dL (ref 65–99)

## 2016-02-14 LAB — LIPASE, BLOOD: Lipase: 29 U/L (ref 11–51)

## 2016-02-14 LAB — I-STAT BETA HCG BLOOD, ED (MC, WL, AP ONLY)

## 2016-02-14 MED ORDER — MORPHINE SULFATE (PF) 4 MG/ML IV SOLN
4.0000 mg | Freq: Once | INTRAVENOUS | Status: AC
  Administered 2016-02-14: 4 mg via INTRAVENOUS
  Filled 2016-02-14: qty 1

## 2016-02-14 MED ORDER — SODIUM CHLORIDE 0.9 % IV BOLUS (SEPSIS)
1000.0000 mL | Freq: Once | INTRAVENOUS | Status: AC
  Administered 2016-02-14: 1000 mL via INTRAVENOUS

## 2016-02-14 MED ORDER — PANTOPRAZOLE SODIUM 20 MG PO TBEC: 20.0000 mg | DELAYED_RELEASE_TABLET | Freq: Every day | ORAL | 2 refills | Status: DC

## 2016-02-14 NOTE — ED Provider Notes (Signed)
[facility] DEPT Provider Note   CSN: [id number] Arrival date & time: 02/14/16  0809     History   Chief Complaint Chief Complaint  Patient presents with  . Abdominal Pain    HPI Vicki Huynh is a 35 y.o. female.  HPI    Vicki Huynh is a 35 y.o. female, with a history of Anxiety or depression, presenting to the ED with epigastric and right upper quadrant pain that recurred last night. Pain is intermittent, sharp, rated 8 out of 10, nonradiating. Pain is worse after eating a meal. Patient states she was seen at [facility] ED in the beginning of October for a similar complaint. States she was diagnosed with gallstones and time. She is told to follow up with her PCP. Patient states, "I told my doctor about it and she basically told me I was crazy." Patient has not taken anything for her symptoms. Denies fever/chills, N/V/C/D, pelvic pain, or any other complaints.      Past Medical History:  Diagnosis Date  . Anxiety   . Depression   . Diabetic mellitus, gestational     Patient Active Problem List   Diagnosis Date Noted  . Menorrhagia with regular cycle 10/09/2014  . Dysmenorrhea 10/09/2014  . Pelvic pain in female 10/09/2014  . 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 Pain in joint, upper arm 02/06/2014  . Paresthesias 02/06/2014    Past Surgical History:  Procedure Laterality Date  . HYSTEROSCOPY WITH NOVASURE N/A 10/09/2014   Procedure: HYSTEROSCOPY WITH NOVASURE;  Surgeon: Cheri Fowler, MD;  Location: [facility];  Service: Gynecology;  Laterality: N/A;  . LAPAROSCOPIC BILATERAL SALPINGECTOMY Bilateral 10/09/2014   Procedure: LAPAROSCOPIC BILATERAL SALPINGECTOMY with removal of Essure coils;  Surgeon: Cheri Fowler, MD;  Location: [facility];  Service: Gynecology;  Laterality: Bilateral;  1hr OR time  . WISDOM TOOTH EXTRACTION      OB History    Gravida Para Term Preterm AB Living   4 2 2   2 2    SAB TAB Ectopic Multiple Live Births   1 1             Home  Medications    Prior to Admission medications   Medication Sig Start Date End Date Taking? Authorizing Provider  glimepiride (AMARYL) 4 MG tablet Take 4 mg by mouth daily with breakfast.   Yes Historical Provider, MD  metFORMIN (GLUCOPHAGE) 500 MG tablet Take 500 mg by mouth 2 (two) times daily with a meal.    Yes Historical Provider, MD  venlafaxine XR (EFFEXOR-XR) 150 MG 24 hr capsule Take 150 mg by mouth daily with breakfast.   Yes Historical Provider, MD  metroNIDAZOLE (FLAGYL) 500 MG tablet Take 1 tablet (500 mg total) by mouth 2 (two) times daily. Patient not taking: Reported on 02/14/2016 12/30/15   Delsa Grana, PA-C  ondansetron (ZOFRAN ODT) 4 MG disintegrating tablet Take 1 tablet (4 mg total) by mouth every 8 (eight) hours as needed for nausea or vomiting. Patient not taking: Reported on 02/14/2016 12/30/15   Delsa Grana, PA-C  oxyCODONE-acetaminophen (PERCOCET/ROXICET) 5-325 MG tablet Take 1-2 tablets by mouth every 8 (eight) hours as needed for severe pain. Patient not taking: Reported on 02/14/2016 12/30/15   Delsa Grana, PA-C  pantoprazole (PROTONIX) 20 MG tablet Take 1 tablet (20 mg total) by mouth daily. 02/14/16   Lorayne Bender, PA-C    Family History Family History  Problem Relation Age of Onset  . Rheum arthritis Mother   . Rheum  arthritis Father     Social History Social History  Substance Use Topics  . Smoking status: Never Smoker  . Smokeless tobacco: Never Used  . Alcohol use No     Allergies   Pertussis vaccines and Wool alcohol [lanolin alcohol]   Review of Systems Review of Systems  Constitutional: Negative for chills, diaphoresis and fever.  Respiratory: Negative for shortness of breath.   Cardiovascular: Negative for chest pain.  Gastrointestinal: Positive for abdominal pain. Negative for blood in stool, constipation, diarrhea, nausea and vomiting.  Genitourinary: Negative for dysuria, hematuria and pelvic pain.  All other systems reviewed and are  negative.    Physical Exam Updated Vital Signs BP 130/87 (BP Location: Left Arm)   Pulse 92   Temp 98 F (36.7 C) (Oral)   Resp 20   SpO2 99%   Physical Exam  Constitutional: She appears well-developed and well-nourished. No distress.  HENT:  Head: Normocephalic and atraumatic.  Eyes: Conjunctivae are normal.  Neck: Neck supple.  Cardiovascular: Normal rate, regular rhythm, normal heart sounds and intact distal pulses.   Pulmonary/Chest: Effort normal and breath sounds normal. No respiratory distress.  Abdominal: Soft. There is tenderness in the right upper quadrant and epigastric area. There is no guarding and negative Murphy's sign.  Musculoskeletal: She exhibits no edema.  Lymphadenopathy:    She has no cervical adenopathy.  Neurological: She is alert.  Skin: Skin is warm and dry. She is not diaphoretic.  Psychiatric: She has a normal mood and affect. Her behavior is normal.  Nursing note and vitals reviewed.    ED Treatments / Results  Labs (all labs ordered are listed, but only abnormal results are displayed) Labs Reviewed  COMPREHENSIVE METABOLIC PANEL - Abnormal; Notable for the following:       Result Value   Glucose, Bld 168 (*)    ALT 63 (*)    All other components within normal limits  URINALYSIS, ROUTINE W REFLEX MICROSCOPIC (NOT AT [facility]) - Abnormal; Notable for the following:    Protein, ur 30 (*)    All other components within normal limits  URINE MICROSCOPIC-ADD ON - Abnormal; Notable for the following:    Squamous Epithelial / LPF 0-5 (*)    Bacteria, UA RARE (*)    All other components within normal limits  LIPASE, BLOOD  CBC  I-STAT BETA HCG BLOOD, ED ([facility], [facility], [facility] ONLY)  CBG MONITORING, ED    EKG  EKG Interpretation None       Radiology US Abdomen Limited Ruq  Result Date: 02/14/2016 CLINICAL DATA:  Right upper quadrant pain EXAM: US ABDOMEN LIMITED - RIGHT UPPER QUADRANT COMPARISON:  None. FINDINGS: Gallbladder: Cholelithiasis.  Largest stone 2.0 cm. Negative for gallbladder wall thickening. Common bile duct: Diameter: 3.4 mm Liver: No focal lesion identified. Within normal limits in parenchymal echogenicity. IMPRESSION: Cholelithiasis without evidence of cholecystitis. Electronically Signed   By: Franchot Gallo M.D.   On: 02/14/2016 15:00    Procedures Procedures (including critical care time)  Medications Ordered in ED Medications  sodium chloride 0.9 % bolus 1,000 mL (0 mLs Intravenous Stopped 02/14/16 1055)  morphine 4 MG/ML injection 4 mg (4 mg Intravenous Given 02/14/16 0930)  morphine 4 MG/ML injection 4 mg (4 mg Intravenous Given 02/14/16 1213)     Initial Impression / Assessment and Plan / ED Course  I have reviewed the triage vital signs and the nursing notes.  Pertinent labs & imaging results that were available during my care of  the patient were reviewed by me and considered in my medical decision making (see chart for details).  Clinical Course     Patient presents with right upper quadrant and epigastric pain reccurring last night. Patient is nontoxic appearing, afebrile, not tachycardic, not tachypneic, not hypotensive, maintains SPO2 of 99% on room air, and is in no apparent distress. Patient has no signs of sepsis or other serious or life-threatening condition.  Patient's [place] was delayed due to understaffing in the [place] dept. Patient was reassessed multiple times during her wait. There were no changes in the patient's condition. Her pain was also managed closely. Ultrasound shows cholelithiasis without evidence of cholecystitis. Patient's pain has been relatively easy to control. She appears to be appropriate for outpatient therapy and PCP follow-up. General surgery follow-up. Return precautions discussed.  Findings and plan of care discussed with Deno Etienne, DO.   Vitals:   02/14/16 1245 02/14/16 1300 02/14/16 1315 02/14/16 1330  BP:  116/77  128/91  Pulse: 80 80 77 73  Resp:      Temp:        TempSrc:      SpO2: 97% 97% 97% 98%   Vitals:   02/14/16 1330 02/14/16 1400 02/14/16 1415 02/14/16 1430  BP: 128/91 111/78  110/77  Pulse: 73 84 89 75  Resp:      Temp:      TempSrc:      SpO2: 98% 98% 98% 98%    Final Clinical Impressions(s) / ED Diagnoses   Final diagnoses:  RUQ pain  Biliary colic    New Prescriptions New Prescriptions   PANTOPRAZOLE (PROTONIX) 20 MG TABLET    Take 1 tablet (20 mg total) by mouth daily.     Lorayne Bender, PA-C 02/14/16 [place], DO 02/14/16 [phone]

## 2016-02-14 NOTE — ED Notes (Signed)
Lab notified this RN that urine sample spilled. Pt ambulatory to RR to obtain another sample.

## 2016-02-14 NOTE — Discharge Instructions (Signed)
There were no new abnormalities on your ultrasound. There were no signs of infection. Switching to a low fat diet may help your symptoms. Follow up with general surgery should this issue persist.  You may also benefit from a medication called a proton pump inhibitor, or PPI, which can help protect the stomach and first part of the intestines from excess stomach acid. Take this medication daily 20-30 minutes before your first meal.

## 2016-02-14 NOTE — ED Notes (Signed)
PA allowing pt to have 1 juice due to long wait for [place] and feeling the her "sugar is low"

## 2016-02-14 NOTE — ED Notes (Signed)
Pt. Requesting CBG check. This nurse assessed CBG 79

## 2016-02-14 NOTE — ED Triage Notes (Signed)
Pt sts pain in gall bladder starting last night; pt sts worse after eating and sts hx of same

## 2016-02-14 NOTE — ED Notes (Signed)
Pt. Requesting pain medication PA notified.

## 2016-02-14 NOTE — ED Notes (Signed)
Patient transported to US 

## 2016-02-28 ENCOUNTER — Ambulatory Visit: Payer: Self-pay | Admitting: General Surgery

## 2016-02-28 NOTE — H&P (Signed)
History of Present Illness  Patient words: Vicki Huynh.  The patient is a 35 year old female who presents for evaluation of gall stones. The patient is a 35 year old female is referred by [facility] for evaluation of symptomatic gallstones. Patient states that she's had a 2 month history of epigastric/right upper quadrant pain that radiates to the back. She states that she's recently diagnosed with diabetes and is been try to schedule low-carb diet. Recently patient was seen in the ER twice. Patient's CBC and LFTs were within normal limits. Patient underwent a ultrasound which revealed cholelithiasis without evidence of cholecystitis, largest stone 2.0 cm.  Patient continues with right upper quadrant pain sporadically.   Other Problems Anxiety Disorder Arthritis Back Pain Cholelithiasis Depression Diabetes Mellitus Hemorrhoids Migraine Headache  Diagnostic Studies History  Colonoscopy never Mammogram never Pap Smear 1-5 years ago  Allergies  No Known Drug Allergies11/30/2017  Medication History  Amaryl (4MG  Tablet, Oral) Active. MetFORMIN HCl (500MG  Tablet, Oral) Active. Pantoprazole Sodium (20MG  Tablet DR, Oral) Active. Effexor XR (150MG  Capsule ER 24HR, Oral) Active. Medications Reconciled  Social History  Alcohol use Remotely quit alcohol use. No caffeine use No drug use Tobacco use Former smoker.  Family History  Alcohol Abuse Father. Arthritis Mother. Colon Polyps Father. Thyroid problems Mother.  Pregnancy / Birth History  Age at menarche 77 years. Gravida 4 Maternal age 41-25 Para 2 Regular periods    Review of Systems  General Present- Appetite Loss, Chills, Fatigue, Fever and Weight Loss. Not Present- Night Sweats and Weight Gain. HEENT Present- Wears glasses/contact lenses. Not Present- Earache, Hearing Loss, Hoarseness, Nose Bleed, Oral Ulcers, Ringing in the Ears, Seasonal Allergies, Sinus Pain, Sore Throat, Visual  Disturbances and Yellow Eyes. Respiratory Not Present- Bloody sputum, Chronic Cough, Difficulty Breathing, Snoring and Wheezing. Breast Not Present- Breast Mass, Breast Pain, Nipple Discharge and Skin Changes. Cardiovascular Present- Difficulty Breathing Lying Down. Not Present- Chest Pain, Leg Cramps, Palpitations, Rapid Heart Rate, Shortness of Breath and Swelling of Extremities. Gastrointestinal Present- Abdominal Pain, Bloating, Constipation, Excessive gas, Gets full quickly at meals, Hemorrhoids, Indigestion and Nausea. Not Present- Bloody Stool, Change in Bowel Habits, Chronic diarrhea, Difficulty Swallowing, Rectal Pain and Vomiting. Female Genitourinary Not Present- Frequency, Nocturia, Painful Urination, Pelvic Pain and Urgency. Musculoskeletal Present- Back Pain. Not Present- Joint Pain, Joint Stiffness, Muscle Pain, Muscle Weakness and Swelling of Extremities. Neurological Not Present- Weakness. Psychiatric Present- Anxiety, Change in Sleep Pattern and Depression. Not Present- Bipolar, Fearful and Frequent crying. Endocrine Present- New Diabetes. Not Present- Cold Intolerance, Excessive Hunger, Hair Changes, Heat Intolerance and Hot flashes. Hematology Not Present- Blood Thinners, Easy Bruising, Excessive bleeding, Gland problems, HIV and Persistent Infections.  Vitals  02/28/2016 9:09 AM Weight: 212.4 lb Height: 67in Body Surface Area: 2.07 m Body Mass Index: 33.27 kg/m  Temp.: 98.47F(Oral)  Pulse: 86 (Regular)  BP: 124/76 (Sitting, Left Arm, Standard)       Physical Exam  General Mental Status-Alert. General Appearance-Consistent with stated age. Hydration-Well hydrated. Voice-Normal.  Head and Neck Head-normocephalic, atraumatic with no lesions or palpable masses.  Eye Eyeball - Bilateral-Extraocular movements intact. Sclera/Conjunctiva - Bilateral-No scleral icterus.  Chest and Lung Exam Chest and lung exam reveals -quiet, even and  easy respiratory effort with no use of accessory muscles. Inspection Chest Wall - Normal. Back - normal.  Cardiovascular Cardiovascular examination reveals -normal heart sounds, regular rate and rhythm with no murmurs.  Abdomen Inspection Normal Exam - No Hernias. Palpation/Percussion Normal exam - Soft, Non Tender, No Rebound tenderness,  No Rigidity (guarding) and No hepatosplenomegaly. Auscultation Normal exam - Bowel sounds normal.  Neurologic Neurologic evaluation reveals -alert and oriented x 3 with no impairment of recent or remote memory. Mental Status-Normal.  Musculoskeletal Normal Exam - Left-Upper Extremity Strength Normal and Lower Extremity Strength Normal. Normal Exam - Right-Upper Extremity Strength Normal, Lower Extremity Weakness.    Assessment & Plan  SYMPTOMATIC CHOLELITHIASIS (K80.20) Impression: 35 year old female since back cholelithiasis 1. We will proceed to the operating room for a laparoscopic cholecystectomy  2. Risks and benefits were discussed with the patient to generally include, but not limited to: infection, bleeding, possible need for post op ERCP, damage to the bile ducts, bile leak, and possible need for further surgery. Alternatives were offered and described. All questions were answered and the patient voiced understanding of the procedure and wishes to proceed at this point with a laparoscopic cholecystectomy

## 2016-03-17 ENCOUNTER — Other Ambulatory Visit: Payer: Self-pay | Admitting: General Surgery

## 2017-05-08 ENCOUNTER — Encounter (HOSPITAL_COMMUNITY): Payer: Self-pay | Admitting: Psychiatry

## 2017-05-08 ENCOUNTER — Ambulatory Visit (INDEPENDENT_AMBULATORY_CARE_PROVIDER_SITE_OTHER): Payer: Medicaid Other | Admitting: Psychiatry

## 2017-05-08 VITALS — BP 128/74 | HR 94 | Ht 66.75 in | Wt 224.5 lb

## 2017-05-08 DIAGNOSIS — F4010 Social phobia, unspecified: Secondary | ICD-10-CM

## 2017-05-08 DIAGNOSIS — F419 Anxiety disorder, unspecified: Secondary | ICD-10-CM | POA: Diagnosis not present

## 2017-05-08 DIAGNOSIS — F410 Panic disorder [episodic paroxysmal anxiety] without agoraphobia: Secondary | ICD-10-CM

## 2017-05-08 DIAGNOSIS — R450 Nervousness: Secondary | ICD-10-CM

## 2017-05-08 DIAGNOSIS — Z91411 Personal history of adult psychological abuse: Secondary | ICD-10-CM

## 2017-05-08 DIAGNOSIS — F3160 Bipolar disorder, current episode mixed, unspecified: Secondary | ICD-10-CM | POA: Diagnosis not present

## 2017-05-08 MED ORDER — LURASIDONE HCL 40 MG PO TABS: 40.0000 mg | ORAL_TABLET | Freq: Every day | ORAL | 0 refills | Status: DC
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
## 2017-05-27 ENCOUNTER — Ambulatory Visit (INDEPENDENT_AMBULATORY_CARE_PROVIDER_SITE_OTHER): Payer: Medicaid Other | Admitting: Psychology

## 2017-05-27 ENCOUNTER — Encounter (HOSPITAL_COMMUNITY): Payer: Self-pay | Admitting: Psychology

## 2017-05-27 DIAGNOSIS — F3160 Bipolar disorder, current episode mixed, unspecified: Secondary | ICD-10-CM | POA: Diagnosis not present
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
## 2017-06-11 ENCOUNTER — Ambulatory Visit (INDEPENDENT_AMBULATORY_CARE_PROVIDER_SITE_OTHER): Payer: Medicaid Other | Admitting: Psychology

## 2017-06-11 ENCOUNTER — Ambulatory Visit (HOSPITAL_COMMUNITY): Payer: Self-pay | Admitting: Psychiatry

## 2017-06-11 DIAGNOSIS — F3160 Bipolar disorder, current episode mixed, unspecified: Secondary | ICD-10-CM | POA: Diagnosis not present

## 2017-06-11 NOTE — Progress Notes (Signed)
   THERAPIST PROGRESS NOTE  Session Time: 11.05am-12pm  Participation Level: Active  Behavioral Response: Well GroomedAlertAnxious  Type of Therapy: Individual Therapy  Treatment Goals addressed: Diagnosis: Bipolar 1 d/o and goal 1.   Interventions: CBT and Supportive  Summary: Vicki Huynh is a 37 y.o. female who presents with affect wnl.  Pt reported that she is adjusting to time change w/ her family- some stressors w/ that this week w/ child who is difficult to get ready in morning anyway.  Pt reported that major stressor is still not hearing from mom- sent a text yesterday and not like her to not respond at least some.  Pt discussed how will have distortion of "something I have done wrong" but pt acknowledges she hasn't done anything wrong.  Pt discussed pattern of this lack of communication from mom in past when mom upset.  Pt reported that she would like to call mom and leave a message for her but not to start an argument.  Pt is able w/ counselor assistance identify what to say.  Pt also discussed communciation w/ husband and how feels that he is sensitive at times and worries that might offend.  Discussed assertive ways of expressing self..   Suicidal/Homicidal: Nowithout intent/plan  Therapist Response: Assessed pt current functioning per pt report.  Processed w/pt her worries and anxieties and assisted w/ reframing.  Discussed distortions.  Explored patterns of conflict and conflict resolution w/ mom and ways to communicate using assertive responses.2  Plan: Return again in 2 weeks.  Diagnosis: Bipolar 1 d/o    Maryn Freelove, LPC 06/11/2017

## 2017-06-23 ENCOUNTER — Encounter (HOSPITAL_COMMUNITY): Payer: Self-pay | Admitting: Psychiatry

## 2017-06-23 ENCOUNTER — Ambulatory Visit (INDEPENDENT_AMBULATORY_CARE_PROVIDER_SITE_OTHER): Payer: Medicaid Other | Admitting: Psychiatry

## 2017-06-23 DIAGNOSIS — F419 Anxiety disorder, unspecified: Secondary | ICD-10-CM | POA: Diagnosis not present

## 2017-06-23 DIAGNOSIS — Z811 Family history of alcohol abuse and dependence: Secondary | ICD-10-CM | POA: Diagnosis not present

## 2017-06-23 DIAGNOSIS — Z818 Family history of other mental and behavioral disorders: Secondary | ICD-10-CM | POA: Diagnosis not present

## 2017-06-23 DIAGNOSIS — F3160 Bipolar disorder, current episode mixed, unspecified: Secondary | ICD-10-CM

## 2017-06-23 DIAGNOSIS — Z813 Family history of other psychoactive substance abuse and dependence: Secondary | ICD-10-CM

## 2017-06-23 MED ORDER — LURASIDONE HCL 60 MG PO TABS: 60.0000 mg | ORAL_TABLET | Freq: Every day | ORAL | 1 refills | Status: DC

## 2017-06-23 NOTE — Progress Notes (Signed)
BH MD/PA/NP OP Progress Note  06/23/2017 9:47 AM Vicki Huynh  MRN:  [id number]  Chief Complaint: I like Latuda.  It is helping my mood but I am taking at nighttime because it make me dizzy.  HPI: Vicki Huynh is 37 year old Caucasian unemployed married female who was seen first time 6 weeks ago for the management of her mental illness.  Patient was referred from primary care physician.  Her primary care physician recently started her on Lamictal after Effexor stopped working but she developed psychosis, hallucination and irritability.  Even reducing the dose of Lamictal she continued to have the symptoms.  We started her on Latuda.  She is taking 40 mg and she noticed improvement in her mood, irritability, racing thoughts, impulsive behavior and manic-like symptoms.  However she still gets some time frustrated but denies any anger and paranoia.  She reported in the beginning she started taking in the morning but having GI side effects and dizziness and she decided to take at nighttime.  She is sleeping better since taking at night.  She still have occasional stomach upset but otherwise she is tolerating the medication without any side effects.  Patient also started seeing [name] for therapy.  Patient is anxious today because she will find out today if her son has autism.  Her 70-year-old son is being evaluated by school and today she will get reports.  Patient denies any suicidal thoughts or homicidal thought.  She lives with her husband and 2 children.  Her husband is supportive.  Patient denies drinking alcohol or using any illegal substances.  Her energy level is fair.  She is checking her blood sugar regularly and she noticed improvement in recent days.  Patient is scheduled to see her physician in 2 weeks.  Visit Diagnosis:    ICD-10-CM   1. Bipolar 1 disorder, mixed (HCC) F31.60 Lurasidone HCl 60 MG TABS    Past Psychiatric History: Reviewed Patient denies any history of psychiatric inpatient  treatment or any suicidal attempt.  She had a history of depression, mood swings, irritability, highs and lows, road rage and impulsive behavior.  She saw briefly therapist in the past.  Her primary care physician prescribed Effexor in 2012 which worked for a few years until stopped working.  It was switched to Lamictal however when dose increased she started to have hallucination and it was stopped.  Patient has history of heavy drinking, smoking marijuana in the past but claims to be sober since she got married in 2009.  Past Medical History:  Past Medical History:  Diagnosis Date  . Anxiety   . Depression   . Diabetic mellitus, gestational     Past Surgical History:  Procedure Laterality Date  . CHOLECYSTECTOMY    . HYSTEROSCOPY WITH NOVASURE N/A 10/09/2014   Procedure: HYSTEROSCOPY WITH NOVASURE;  Surgeon: Cheri Fowler, MD;  Location: [facility];  Service: Gynecology;  Laterality: N/A;  . LAPAROSCOPIC BILATERAL SALPINGECTOMY Bilateral 10/09/2014   Procedure: LAPAROSCOPIC BILATERAL SALPINGECTOMY with removal of Essure coils;  Surgeon: Cheri Fowler, MD;  Location: [facility];  Service: Gynecology;  Laterality: Bilateral;  1hr OR time  . WISDOM TOOTH EXTRACTION      Family Psychiatric History: Reviewed.  Family History:  Family History  Problem Relation Age of Onset  . Rheum arthritis Mother   . Rheum arthritis Father   . Alcohol abuse Father        in recovery  . Depression Father   . Drug abuse Brother  Social History:  Social History   Socioeconomic History  . Marital status: Married    Spouse name: Not on file  . Number of children: Not on file  . Years of education: Not on file  . Highest education level: Not on file  Occupational History  . Not on file  Social Needs  . Financial resource strain: Not on file  . Food insecurity:    Worry: Not on file    Inability: Not on file  . Transportation needs:    Medical: Not on file    Non-medical: Not on file  Tobacco Use   . Smoking status: Never Smoker  . Smokeless tobacco: Never Used  Substance and Sexual Activity  . Alcohol use: No    Alcohol/week: 0.0 oz  . Drug use: No  . Sexual activity: Yes    Birth control/protection: Surgical  Lifestyle  . Physical activity:    Days per week: Not on file    Minutes per session: Not on file  . Stress: Not on file  Relationships  . Social connections:    Talks on phone: Not on file    Gets together: Not on file    Attends religious service: Not on file    Active member of club or organization: Not on file    Attends meetings of clubs or organizations: Not on file    Relationship status: Not on file  Other Topics Concern  . Not on file  Social History Narrative  . Not on file    Allergies:  Allergies  Allergen Reactions  . Lamictal [Lamotrigine] Other (See Comments)    Hallucination  . Nickel   . Wool Alcohol [Lanolin Alcohol] Itching    Metabolic Disorder Labs: No results found for: HGBA1C, MPG No results found for: PROLACTIN No results found for: CHOL, TRIG, HDL, CHOLHDL, VLDL, LDLCALC No results found for: TSH  Therapeutic Level Labs: No results found for: LITHIUM No results found for: VALPROATE No components found for:  CBMZ  Current Medications: Current Outpatient Medications  Medication Sig Dispense Refill  . glimepiride (AMARYL) 4 MG tablet Take 4 mg by mouth daily with breakfast.    . lurasidone (LATUDA) 40 MG TABS tablet Take 1 tablet (40 mg total) by mouth daily with breakfast. 30 tablet 0  . metFORMIN (GLUCOPHAGE) 500 MG tablet Take 500 mg by mouth 2 (two) times daily.     No current facility-administered medications for this visit.      Musculoskeletal: Strength & Muscle Tone: within normal limits Gait & Station: normal Patient leans: N/A  Psychiatric Specialty Exam: ROS  Blood pressure 130/78, pulse 88, height 5\' 7"  (1.702 m), weight 226 lb (102.5 kg).Body mass index is 35.4 kg/m.  General Appearance: Casual  Eye  Contact:  Good  Speech:  Fast but clear and coherent  Volume:  Normal  Mood:  Somewhat elated  Affect:  Congruent  Thought Process:  Goal Directed  Orientation:  Full (Time, Place, and Person)  Thought Content: Rumination   Suicidal Thoughts:  No  Homicidal Thoughts:  No  Memory:  Immediate;   Good Recent;   Good Remote;   Good  Judgement:  Good  Insight:  Good  Psychomotor Activity:  Increased  Concentration:  Concentration: Fair and Attention Span: Fair  Recall:  Good  Fund of Knowledge: Good  Language: Good  Akathisia:  No  Handed:  Right  AIMS (if indicated): not done  Assets:  Communication Skills Desire for Gravity  Social Support  ADL's:  Intact  Cognition: WNL  Sleep:  Improved   Screenings: PHQ2-9     Nutrition from 01/31/2016 in [facility]  PHQ-2 Total Score  0       Assessment and Plan: Bipolar disorder type I.  Anxiety disorder NOS.  Patient doing better on Latuda.  Recommended to trial 60 mg to target residual mood lability.  Reinforced medication side effects.  Recommended to try probiotics to help GI side effects.  Reassurance given usually side effects go away in few weeks.  Encouraged to keep appointment with [name].  Recommended to call us back if she has any question, concern if she feels worsening of the symptoms.  Follow-up in 2 months.   [name], MD 06/23/2017, 9:47 AM

## 2017-06-25 ENCOUNTER — Ambulatory Visit (HOSPITAL_COMMUNITY): Payer: Self-pay | Admitting: Psychology

## 2017-07-01 ENCOUNTER — Other Ambulatory Visit (HOSPITAL_COMMUNITY): Payer: Self-pay | Admitting: Psychiatry

## 2017-07-01 ENCOUNTER — Telehealth (HOSPITAL_COMMUNITY): Payer: Self-pay

## 2017-07-01 DIAGNOSIS — F3160 Bipolar disorder, current episode mixed, unspecified: Secondary | ICD-10-CM

## 2017-07-01 NOTE — Telephone Encounter (Signed)
Patient called and said that since increasing the Latuda to 60 mg she can not sleep at night. She would like to know if she can go back down to the 40 mg. Please review and advise, thank you

## 2017-07-01 NOTE — Telephone Encounter (Signed)
She can go back to 40 mg Latuda.  Please call a new prescription.

## 2017-07-02 MED ORDER — LURASIDONE HCL 40 MG PO TABS: 40.0000 mg | ORAL_TABLET | Freq: Every day | ORAL | 2 refills | Status: DC

## 2017-07-02 NOTE — Telephone Encounter (Signed)
Sent in the new prescription and called patient to let her know

## 2017-07-13 ENCOUNTER — Encounter (HOSPITAL_COMMUNITY): Payer: Self-pay | Admitting: Psychology

## 2017-07-13 ENCOUNTER — Ambulatory Visit (HOSPITAL_COMMUNITY): Payer: Self-pay | Admitting: Psychology

## 2017-07-13 NOTE — Progress Notes (Signed)
Vicki Huynh is a 37 y.o. female patient who didn't show for her appointment.  Letter sent.        [name], LPC

## 2017-07-30 ENCOUNTER — Ambulatory Visit (HOSPITAL_COMMUNITY): Payer: Self-pay | Admitting: Psychology

## 2017-08-13 ENCOUNTER — Ambulatory Visit (INDEPENDENT_AMBULATORY_CARE_PROVIDER_SITE_OTHER): Payer: Medicaid Other | Admitting: Psychology

## 2017-08-13 DIAGNOSIS — F3160 Bipolar disorder, current episode mixed, unspecified: Secondary | ICD-10-CM

## 2017-08-13 NOTE — Progress Notes (Signed)
   THERAPIST PROGRESS NOTE  Session Time: 9.05amn-9.50am  Participation Level: Active  Behavioral Response: Well GroomedAlertaffect wnl  Type of Therapy: Individual Therapy  Treatment Goals addressed: Diagnosis: Bipolar 1 and goal 1.  Interventions: CBT, Supportive and Other: healthy boundaries  Summary: Vicki Huynh is a 37 y.o. female who presents with affect wnl.  Pt reported that she has been doing overall well w/ mood- energy overall good, most days happy.  Pt reported that she and mother are talking and interacting again.  Pt reported that had a couple meet ups- first time mom blamed her for everything.  Since have had positive interactions- w/ some passive aggressive comments on mom's part. Pt reports she is just not responding to those as aware will just lead to further conflict.  Pt reported on upcoming transition w/ summer and husband discussing return to work this tsummer.  Pt acknowledges some anxiety about but able to reframe and idnetifying how she will be able to work through transitoin. .   Suicidal/Homicidal: Nowithout intent/plan  Therapist Response: Assessed pt current functioning pet pt report.  Processed w/pt coping w/ interactions w/ mom and conflict resolution w/ passive aggressive remarks.  Explored w/pt upcoming transition w/ summer and assisted pt in identifying ability to cope through transitions.  Plan: Return again in 2 weeks.  Diagnosis: Bipolar 1 d/o   Raynor Calcaterra, [place] 08/13/2017

## 2017-08-25 ENCOUNTER — Encounter (HOSPITAL_COMMUNITY): Payer: Self-pay | Admitting: Psychiatry

## 2017-08-25 ENCOUNTER — Ambulatory Visit (INDEPENDENT_AMBULATORY_CARE_PROVIDER_SITE_OTHER): Payer: Medicaid Other | Admitting: Psychiatry

## 2017-08-25 VITALS — BP 106/71 | HR 69 | Ht 67.0 in | Wt 211.0 lb

## 2017-08-25 DIAGNOSIS — F3160 Bipolar disorder, current episode mixed, unspecified: Secondary | ICD-10-CM | POA: Diagnosis not present

## 2017-08-25 MED ORDER — LURASIDONE HCL 40 MG PO TABS: 40.0000 mg | ORAL_TABLET | Freq: Every day | ORAL | 2 refills | Status: DC

## 2017-08-25 NOTE — Progress Notes (Signed)
BH MD/PA/NP OP Progress Note  08/25/2017 9:35 AM Vicki Huynh  MRN:  [id number]  Chief Complaint: I cannot tolerate higher dose of Latuda.  It is causing the insomnia.  I am doing very well on 40 mg.  HPI: Vicki Huynh came for her follow-up appointment.  She is taking Latuda 40 mg.  We recommended 60 mg but she could not handle the side effects and having insomnia.  She is feeling good with the 40 mg of Latuda.  She denies any irritability, anger, mania, psychosis.  Her sleep is good.  She is less anxious.  She was told her 17-year-old son has autism and now he is getting all the resources from the school.  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  She lives with her husband who is very supportive.  Patient denies any crying spells, panic attacks, irritability or any feeling of hopelessness or worthlessness.  She is scheduled to see Dr. Sabra Heck in July for her hemoglobin A1c.  Patient lost 10 pounds since last visit.  She is more active and social.  She is watching her calorie intake and doing regular exercise.  Patient denies drinking or using any illegal substances.  She has no tremors, shakes or any EPS.  Visit Diagnosis:    ICD-10-CM   1. Bipolar 1 disorder, mixed ([place]) F31.60     Past Psychiatric History: Reviewed. Patient denies any history of psychiatric inpatient treatment or any suicidal attempt.  She had a history of depression, mood swings, irritability, highs and lows, road rage and impulsive behavior.  She saw briefly therapist in the past.  Her primary care physician prescribed Effexor in 2012 which worked for a few years until stopped working.  It was switched to Lamictal however when dose increased she started to have hallucination and it was stopped.    We tried [place] but when dose increased to 60 mg she could not tolerate side effects and having insomnia.  Patient has history of heavy drinking, smoking marijuana in the past but claims to be sober since she got married in 2009.  Past  Medical History:  Past Medical History:  Diagnosis Date  . Anxiety   . Depression   . Diabetic mellitus, gestational     Past Surgical History:  Procedure Laterality Date  . CHOLECYSTECTOMY    . HYSTEROSCOPY WITH NOVASURE N/A 10/09/2014   Procedure: HYSTEROSCOPY WITH NOVASURE;  Surgeon: Cheri Fowler, MD;  Location: [facility];  Service: Gynecology;  Laterality: N/A;  . LAPAROSCOPIC BILATERAL SALPINGECTOMY Bilateral 10/09/2014   Procedure: LAPAROSCOPIC BILATERAL SALPINGECTOMY with removal of Essure coils;  Surgeon: Cheri Fowler, MD;  Location: [facility];  Service: Gynecology;  Laterality: Bilateral;  1hr OR time  . WISDOM TOOTH EXTRACTION      Family Psychiatric History: Reviewed.  Family History:  Family History  Problem Relation Age of Onset  . Rheum arthritis Mother   . Rheum arthritis Father   . Alcohol abuse Father        in recovery  . Depression Father   . Drug abuse Brother     Social History:  Social History   Socioeconomic History  . Marital status: Married    Spouse name: Not on file  . Number of children: Not on file  . Years of education: Not on file  . Highest education level: Not on file  Occupational History  . Not on file  Social Needs  . Financial resource strain: Not on file  . Food insecurity:  Worry: Not on file    Inability: Not on file  . Transportation needs:    Medical: Not on file    Non-medical: Not on file  Tobacco Use  . Smoking status: Never Smoker  . Smokeless tobacco: Never Used  Substance and Sexual Activity  . Alcohol use: No    Alcohol/week: 0.0 oz  . Drug use: No  . Sexual activity: Yes    Birth control/protection: Surgical  Lifestyle  . Physical activity:    Days per week: Not on file    Minutes per session: Not on file  . Stress: Not on file  Relationships  . Social connections:    Talks on phone: Not on file    Gets together: Not on file    Attends religious service: Not on file    Active member of club or  organization: Not on file    Attends meetings of clubs or organizations: Not on file    Relationship status: Not on file  Other Topics Concern  . Not on file  Social History Narrative  . Not on file    Allergies:  Allergies  Allergen Reactions  . Lamictal [Lamotrigine] Other (See Comments)    Hallucination  . Nickel   . Wool Alcohol [Lanolin Alcohol] Itching    Metabolic Disorder Labs: No results found for: HGBA1C, MPG No results found for: PROLACTIN No results found for: CHOL, TRIG, HDL, CHOLHDL, VLDL, LDLCALC No results found for: TSH  Therapeutic Level Labs: No results found for: LITHIUM No results found for: VALPROATE No components found for:  CBMZ  Current Medications: Current Outpatient Medications  Medication Sig Dispense Refill  . ACCU-CHEK FASTCLIX LANCETS MISC   6  . ACCU-CHEK GUIDE test strip   6  . atorvastatin (LIPITOR) 10 MG tablet Take 10 mg by mouth once.    Marland Kitchen glimepiride (AMARYL) 4 MG tablet Take 4 mg by mouth daily with breakfast.    . lurasidone (LATUDA) 40 MG TABS tablet Take 1 tablet (40 mg total) by mouth daily with breakfast. 30 tablet 2  . Lurasidone HCl 60 MG TABS Take 1 tablet (60 mg total) by mouth daily. 30 tablet 1  . metFORMIN (GLUCOPHAGE) 500 MG tablet Take 500 mg by mouth 2 (two) times daily.     No current facility-administered medications for this visit.      Musculoskeletal: Strength & Muscle Tone: within normal limits Gait & Station: normal Patient leans: N/A  Psychiatric Specialty Exam: Review of Systems  Constitutional: Positive for weight loss.    Blood pressure 106/71, pulse 69, height 5\' 7"  (1.702 m), weight 211 lb (95.7 kg), SpO2 98 %.Body mass index is 33.05 kg/m.  General Appearance: Casual  Eye Contact:  Good  Speech:  Clear and Coherent  Volume:  Normal  Mood:  Anxious  Affect:  Congruent  Thought Process:  Goal Directed  Orientation:  Full (Time, Place, and Person)  Thought Content: Logical   Suicidal  Thoughts:  No  Homicidal Thoughts:  No  Memory:  Immediate;   Good Recent;   Good Remote;   Good  Judgement:  Good  Insight:  Good  Psychomotor Activity:  Normal  Concentration:  Concentration: Good and Attention Span: Fair  Recall:  Good  Fund of Knowledge: Good  Language: Good  Akathisia:  No  Handed:  Right  AIMS (if indicated): not done  Assets:  Communication Skills Desire for Improvement Housing Resilience Social Support  ADL's:  Intact  Cognition: WNL  Sleep:  Good   Screenings: PHQ2-9     Nutrition from 01/31/2016 in [facility]  PHQ-2 Total Score  0       Assessment and Plan: Bipolar disorder type I.  Patient doing better on Latuda 40 mg.  She has no tremors, shakes or any EPS.  Discussed medication side effects and benefits.  Recommended to continue counseling with [name].  Follow-up in 3 months.  Encourage healthy lifestyle and watch her calorie intake and do regular exercise.  Patient is scheduled to see her physician Dr. Sabra Heck at [facility] for hemoglobin A1c in July.  I recommended to have her blood work results faxed to [place].   [name], MD 08/25/2017, 9:35 AM

## 2017-08-27 ENCOUNTER — Ambulatory Visit (HOSPITAL_COMMUNITY): Payer: Self-pay | Admitting: Psychology

## 2017-09-01 ENCOUNTER — Ambulatory Visit (INDEPENDENT_AMBULATORY_CARE_PROVIDER_SITE_OTHER): Payer: Medicaid Other | Admitting: Psychology

## 2017-09-01 DIAGNOSIS — F3160 Bipolar disorder, current episode mixed, unspecified: Secondary | ICD-10-CM

## 2017-09-01 DIAGNOSIS — F319 Bipolar disorder, unspecified: Secondary | ICD-10-CM

## 2017-09-01 NOTE — Progress Notes (Signed)
   THERAPIST PROGRESS NOTE  Session Time: 10am-10.50am  Participation Level: Active  Behavioral Response: Well GroomedAlertaffect wnl  Type of Therapy: Individual Therapy  Treatment Goals addressed: Diagnosis: Bipolar 1 d/o and goal 1.  Interventions: CBT and Supportive  Summary: CLARIVEL CALLAWAY is a 37 y.o. female who presents with affect wnl.  Pt reported that she has been doing well managing both her diabetes and self care for mood.  Pt reports that she has been sleeping well.  Pt reports still deals w/ some down days and still has anxiety and worry but feels that she can cope with.  pt discussed that husband planning for job searching and some anxiety about transition to him not being in the home during day.  Pt reported that she is aware that she can cope through and reminded self of past success.  Pt reported that she does feel anxiety when not having anything she is doing.  Pt recognized that ok to sit w/ a little anxiety and be present to quiet- not immediately try to fill. Pt discussed how still communicating w/ mom and just needing to maintain asserting self..   Suicidal/Homicidal: Nowithout intent/plan  Therapist Response: Assessed pt current functioning per pt report. Processed w/pt coping w/ anxiety and ways to deescalate and feels success even when experiences anxiety and not just avoid to reduce anxiety.    Plan: Return again in 2 weeks.  Diagnosis: Bipolar 1/ [place], [facility] 09/01/2017

## 2017-10-06 ENCOUNTER — Ambulatory Visit (INDEPENDENT_AMBULATORY_CARE_PROVIDER_SITE_OTHER): Payer: Medicaid Other | Admitting: Psychology

## 2017-10-06 DIAGNOSIS — F3160 Bipolar disorder, current episode mixed, unspecified: Secondary | ICD-10-CM

## 2017-10-06 NOTE — Progress Notes (Signed)
   THERAPIST PROGRESS NOTE  Session Time: 1.30pm-2.29pm  Participation Level: Active  Behavioral Response: Well GroomedAlertAnxious  Type of Therapy: Individual Therapy  Treatment Goals addressed: Diagnosis: Bipolar 1 d/o and goal 1.  Interventions: CBT and Supportive  Summary: Vicki Huynh is a 38 y.o. female who presents with affect wnl.  Pt reported having anxiety today about leaving the house and recognizes avoiding things due to anxiety.  Pt is aware and is challenging self not to avoid/ cancel plans due to anxiety.  Pt discussed how she has been able to express anxiety to husband and "sitting" w/ anxiety and finding things to help deescalate. Pt is increasing awareness that she is able to manage through this most of the time.  Pt discussed her visit w/ mom w/ kids and that went well even despite mom's snide comments.  Pt discussed how she isn't having to respond to these or internalize the comments.     Suicidal/Homicidal: Nowithout intent/plan  Therapist Response: Assessed pt current functioning per pt report. processed w/pt coping w/ anxiety- how she is expressing and ways of self soothing her system.  Explored w/pt how thoughts tend towards anxiety of worst case scenario and how to reframe.  Plan: Return again in 2 weeks.  Diagnosis: Bipolar 1 d/o   YATES,LEANNE, LPC 10/06/2017

## 2017-10-20 ENCOUNTER — Ambulatory Visit (HOSPITAL_COMMUNITY): Payer: Self-pay | Admitting: Psychology

## 2017-11-03 ENCOUNTER — Ambulatory Visit (INDEPENDENT_AMBULATORY_CARE_PROVIDER_SITE_OTHER): Payer: Medicaid Other | Admitting: Psychology

## 2017-11-03 DIAGNOSIS — F3160 Bipolar disorder, current episode mixed, unspecified: Secondary | ICD-10-CM

## 2017-11-03 NOTE — Progress Notes (Signed)
   THERAPIST PROGRESS NOTE  Session Time: 1.30pm-2.20pm  Participation Level: Active  Behavioral Response: Well GroomedAlertaffect wnl  Type of Therapy: Individual Therapy  Treatment Goals addressed: Diagnosis: Bipolar 1 d/o and goal 1.  Interventions: CBT and Supportive  Summary: Vicki Huynh is a 37 y.o. female who presents with affect wnl.  Pt reported that summer is going well and has enjoyed time w/her son's.  Pt reported that she wasn't able to come last appointment as her husband had an intestinal infection.  Pt reported he is recovered and now her father is going in for surgery on artery.  Pt reports anxiety about and ruminates on the risk although low.  Pt reported that she is aware of focusing on worst case scenario and needing to reframe.  Pt reported she did have a stressful visit w/ mom making comments about son's behavior.  Pt has been avoiding contact- aware that avoiding won't benefit in long term and that can contact to just update not for visit.    Suicidal/Homicidal: Nowithout intent/plan  Therapist Response: Assessed pt current functioning per pt report.Processed w/pt anxiety and worried thoughts- reflecting distortion and assisting w/ reframe.  Discussed wasys of interacting w/ mom- w/out avoiding and not having to visit face to face.  Plan: Return again in 2 weeks.  Diagnosis: Bipolar 1 d/o   Jerl Munyan, LPC 11/03/2017

## 2017-11-25 ENCOUNTER — Ambulatory Visit (HOSPITAL_COMMUNITY): Payer: Self-pay | Admitting: Psychology

## 2017-11-27 ENCOUNTER — Ambulatory Visit (HOSPITAL_COMMUNITY): Payer: Self-pay | Admitting: Psychiatry

## 2017-12-16 ENCOUNTER — Ambulatory Visit (INDEPENDENT_AMBULATORY_CARE_PROVIDER_SITE_OTHER): Payer: Medicaid Other | Admitting: Psychology

## 2017-12-16 ENCOUNTER — Encounter

## 2017-12-16 DIAGNOSIS — F3160 Bipolar disorder, current episode mixed, unspecified: Secondary | ICD-10-CM | POA: Diagnosis not present

## 2017-12-16 NOTE — Progress Notes (Signed)
   THERAPIST PROGRESS NOTE  Session Time: 9.03am-10am  Participation Level: Active  Behavioral Response: Well GroomedAlertAnxious  Type of Therapy: Individual Therapy  Treatment Goals addressed: Diagnosis: bipolar d/o and goal 1.  Interventions: CBT, Supportive and Other: grounding skills  Summary: Vicki Huynh is a 37 y.o. female who presents with report of increased anxiety over past couple weeks.  Pt is able to acknowledge anxiety related mostly to transition and challenges arising w/ kids return to school.  Pt was able to identify that communicating to problem solve did resolve some of worries and was able to see positive outcomes of this.  Pt reported other times just feeling anxious in body and unable to identify any worries at the time.  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  Pt was able to discuss relationship w / mom and recent no communication from mom. Pt was able to agree to increase use of grounding skills to assist in reducing her anxiety levels.  Suicidal/Homicidal: Nowithout intent/plan  Therapist Response: Assessed pt current functioning per pt report.  Processed w/pt coping w/ increased anxiety.  Assisted w/ identifying worries and acknowledging pt ability to manage and problem solve.  Discussed use of grounding techniques to assist w/ decreasing anxiety.  Plan: Return again in 2 weeks.  Diagnosis: Bipolar 1 d/o   Vicki Huynh, [place] 12/16/2017

## 2017-12-21 ENCOUNTER — Other Ambulatory Visit (HOSPITAL_COMMUNITY): Payer: Self-pay | Admitting: Psychiatry

## 2017-12-26 ENCOUNTER — Other Ambulatory Visit (HOSPITAL_COMMUNITY): Payer: Self-pay

## 2017-12-28 NOTE — Telephone Encounter (Signed)
Received a refill faxed request from pharmacy for [place] 40mg . Next appointment is scheduled for 02-01-18.

## 2017-12-30 ENCOUNTER — Other Ambulatory Visit (HOSPITAL_COMMUNITY): Payer: Self-pay

## 2017-12-30 MED ORDER — LURASIDONE HCL 40 MG PO TABS: 40.0000 mg | ORAL_TABLET | Freq: Every day | ORAL | 0 refills | Status: DC

## 2018-01-06 ENCOUNTER — Ambulatory Visit (INDEPENDENT_AMBULATORY_CARE_PROVIDER_SITE_OTHER): Payer: Medicaid Other | Admitting: Psychology

## 2018-01-06 DIAGNOSIS — F3160 Bipolar disorder, current episode mixed, unspecified: Secondary | ICD-10-CM | POA: Diagnosis not present

## 2018-01-06 NOTE — Progress Notes (Signed)
   THERAPIST PROGRESS NOTE  Session Time: 9.03am-9.55am  Participation Level: Active  Behavioral Response: Well GroomedAlertaffect wnl  Type of Therapy: Individual Therapy  Treatment Goals addressed: Diagnosis: Bipolar 1 d/o and goal1  Interventions: CBT, Assertiveness Training and Supportive  Summary: Vicki Huynh is a 37 y.o. female who presents with affect wnl.  Pt reported on her full and positive plans for the week.  Pt reported that she contact mom last week after having a dream that upset her and realizing that won't be ok if not talking to mom.  Pt she would like a relationship that she doesn't feel that she has to reach out always.  Pt accepting that this might not change. Pt reported on less anxiety recent.  Pt reported that she is using grounding skills and is pushing self to sit w/ some discomfort.  Pt reported that she did have to assert herself w/ teacher- telling no and this was hard but was able to do it and felt good.    Suicidal/Homicidal: Nowithout intent/plan  Therapist Response: Assessed pt current functioning per pt report. Processed w/pt coping w/ interactions w/ mom- and acceptance of things she can't change. Explored w/pt her anxiety and how managing.  Praised pt for positive steps taking and coping skills.  Discussed pt ability to assert self and benefit for sel.    Plan: Return again in 2 weeks.  Diagnosis: Bipolar 1 d/o  Vicki Huynh,Vicki Huynh, [place] 01/06/2018

## 2018-01-17 ENCOUNTER — Emergency Department (HOSPITAL_COMMUNITY)
Admission: EM | Admit: 2018-01-17 | Discharge: 2018-01-17 | Disposition: A | Discharge status: Home / Self Care | Payer: Medicaid Other | Attending: Emergency Medicine | Admitting: Emergency Medicine

## 2018-01-17 ENCOUNTER — Encounter (HOSPITAL_COMMUNITY): Payer: Self-pay

## 2018-01-17 ENCOUNTER — Other Ambulatory Visit: Payer: Self-pay

## 2018-01-17 DIAGNOSIS — K2900 Acute gastritis without bleeding: Secondary | ICD-10-CM | POA: Diagnosis not present

## 2018-01-17 DIAGNOSIS — E119 Type 2 diabetes mellitus without complications: Secondary | ICD-10-CM | POA: Diagnosis not present

## 2018-01-17 DIAGNOSIS — R350 Frequency of micturition: Secondary | ICD-10-CM | POA: Insufficient documentation

## 2018-01-17 DIAGNOSIS — Z79899 Other long term (current) drug therapy: Secondary | ICD-10-CM | POA: Diagnosis not present

## 2018-01-17 DIAGNOSIS — R1013 Epigastric pain: Secondary | ICD-10-CM | POA: Diagnosis present

## 2018-01-17 DIAGNOSIS — Z7984 Long term (current) use of oral hypoglycemic drugs: Secondary | ICD-10-CM | POA: Diagnosis not present

## 2018-01-17 DIAGNOSIS — K5900 Constipation, unspecified: Secondary | ICD-10-CM | POA: Diagnosis not present

## 2018-01-17 LAB — COMPREHENSIVE METABOLIC PANEL
ALBUMIN: 4.5 g/dL (ref 3.5–5.0)
ALT: 17 U/L (ref 0–44)
AST: 17 U/L (ref 15–41)
Alkaline Phosphatase: 55 U/L (ref 38–126)
Anion gap: 8 (ref 5–15)
BUN: 16 mg/dL (ref 6–20)
CHLORIDE: 107 mmol/L (ref 98–111)
CO2: 27 mmol/L (ref 22–32)
CREATININE: 0.81 mg/dL (ref 0.44–1.00)
Calcium: 9.8 mg/dL (ref 8.9–10.3)
GFR calc Af Amer: >60 mL/min (ref >60)
GFR calc non Af Amer: >60 mL/min (ref >60)
Glucose, Bld: 144 mg/dL — ABNORMAL HIGH (ref 70–99)
POTASSIUM: 3.4 mmol/L — AB (ref 3.5–5.1)
Sodium: 142 mmol/L (ref 135–145)
Total Bilirubin: 0.6 mg/dL (ref 0.3–1.2)
Total Protein: 7.7 g/dL (ref 6.5–8.1)

## 2018-01-17 LAB — URINALYSIS, ROUTINE W REFLEX MICROSCOPIC
Bilirubin Urine: NEGATIVE
Glucose, UA: NEGATIVE mg/dL
HGB URINE DIPSTICK: NEGATIVE
Ketones, ur: 5 mg/dL — AB
Nitrite: NEGATIVE
PROTEIN: NEGATIVE mg/dL
Specific Gravity, Urine: 1.03 (ref 1.005–1.030)
pH: 6 (ref 5.0–8.0)

## 2018-01-17 LAB — CBC
HEMATOCRIT: 38.7 % (ref 36.0–46.0)
HEMOGLOBIN: 13.2 g/dL (ref 12.0–15.0)
MCH: 29.5 pg (ref 26.0–34.0)
MCHC: 34.1 g/dL (ref 30.0–36.0)
MCV: 86.4 fL (ref 80.0–100.0)
Platelets: 356 10*3/uL (ref 150–400)
RBC: 4.48 MIL/uL (ref 3.87–5.11)
RDW: 12 % (ref 11.5–15.5)
WBC: 7.3 10*3/uL (ref 4.0–10.5)
nRBC: 0 % (ref 0.0–0.2)

## 2018-01-17 LAB — LIPASE, BLOOD: LIPASE: 30 U/L (ref 11–51)

## 2018-01-17 LAB — I-STAT BETA HCG BLOOD, ED (MC, WL, AP ONLY)

## 2018-01-17 MED ORDER — OMEPRAZOLE 20 MG PO CPDR: 20.0000 mg | DELAYED_RELEASE_CAPSULE | Freq: Every day | ORAL | 0 refills | Status: DC

## 2018-01-17 MED ORDER — SUCRALFATE 1 G PO TABS: 1.0000 g | ORAL_TABLET | Freq: Three times a day (TID) | ORAL | 0 refills | Status: DC

## 2018-01-17 MED ORDER — FAMOTIDINE IN NACL 20-0.9 MG/50ML-% IV SOLN
20.0000 mg | Freq: Once | INTRAVENOUS | Status: AC
  Administered 2018-01-17: 20 mg via INTRAVENOUS
  Filled 2018-01-17: qty 50

## 2018-01-17 MED ORDER — ALUM & MAG HYDROXIDE-SIMETH 200-200-20 MG/5ML PO SUSP
30.0000 mL | Freq: Once | ORAL | Status: AC
  Administered 2018-01-17: 30 mL via ORAL
  Filled 2018-01-17: qty 30

## 2018-01-17 NOTE — ED Provider Notes (Signed)
[facility] DEPT Provider Note   CSN: [id number] Arrival date & time: 01/17/18  1815     History   Chief Complaint Chief Complaint  Patient presents with  . Abdominal Pain    HPI Vicki Huynh is a 37 y.o. female.  Patient is a 37 year old female who presents with abdominal pain.  She has a history of diabetes and anxiety.  For about a week she has had some nausea and discomfort in her epigastrium.  Its worse after eating.  She states it happens immediately after eating.  She also has some increase in reflux symptoms.  She is status post cholecystectomy.  She has had some nausea but no vomiting.  No fevers.  He has had a little bit of urinary frequency but no burning on urination.  She has some constipation but no diarrhea.  She has had some improvement with an acid type medicines but she has not been taking them consistently.     Past Medical History:  Diagnosis Date  . Anxiety   . Depression   . Diabetic mellitus, gestational     Patient Active Problem List   Diagnosis Date Noted  . Menorrhagia with regular cycle 10/09/2014  . Dysmenorrhea 10/09/2014  . Pelvic pain in female 10/09/2014  . 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 Pain in joint, upper arm 02/06/2014  . Paresthesias 02/06/2014    Past Surgical History:  Procedure Laterality Date  . CHOLECYSTECTOMY    . HYSTEROSCOPY WITH NOVASURE N/A 10/09/2014   Procedure: HYSTEROSCOPY WITH NOVASURE;  Surgeon: Cheri Fowler, MD;  Location: [facility];  Service: Gynecology;  Laterality: N/A;  . LAPAROSCOPIC BILATERAL SALPINGECTOMY Bilateral 10/09/2014   Procedure: LAPAROSCOPIC BILATERAL SALPINGECTOMY with removal of Essure coils;  Surgeon: Cheri Fowler, MD;  Location: [facility];  Service: Gynecology;  Laterality: Bilateral;  1hr OR time  . WISDOM TOOTH EXTRACTION       OB History    Gravida  4   Para  2   Term  2   Preterm      AB  2   Living  2     SAB  1   TAB  1   Ectopic      Multiple      Live Births               Home Medications    Prior to Admission medications   Medication Sig Start Date End Date Taking? Authorizing Provider  atorvastatin (LIPITOR) 10 MG tablet Take 10 mg by mouth every evening.    Yes [provider]  bismuth subsalicylate (PEPTO BISMOL) 262 MG chewable tablet Chew 524 mg by mouth daily as needed for indigestion.   Yes [provider]  Calcium Carbonate Antacid (ANTACID) 1177 MG CHEW Chew 2 tablets by mouth daily as needed (indigestion).   Yes [provider]  glimepiride (AMARYL) 4 MG tablet Take 4 mg by mouth daily with breakfast.   Yes [provider]  losartan (COZAAR) 25 MG tablet Take 25 mg by mouth daily. 12/16/17  Yes [provider]  lurasidone (LATUDA) 40 MG TABS tablet Take 1 tablet (40 mg total) by mouth daily with breakfast. 12/30/17  Yes Arfeen, Arlyce Harman, MD  metFORMIN (GLUCOPHAGE-XR) 500 MG 24 hr tablet Take 500 mg by mouth daily.  12/28/17  Yes [provider]  rosuvastatin (CRESTOR) 5 MG tablet Take 5 mg by mouth every other day. 12/28/17  Yes [provider]  [place]  06/20/17   [provider]  ACCU-CHEK GUIDE test strip  06/20/17   [provider]  omeprazole (PRILOSEC) 20 MG capsule Take 1 capsule (20 mg total) by mouth daily. 01/17/18   Malvin Johns, MD  sucralfate (CARAFATE) 1 g tablet Take 1 tablet (1 g total) by mouth 4 (four) times daily -  with meals and at bedtime. 01/17/18   Malvin Johns, MD    Family History Family History  Problem Relation Age of Onset  . Rheum arthritis Mother   . Rheum arthritis Father   . Alcohol abuse Father        in recovery  . Depression Father   . Drug abuse Brother     Social History Social History   Tobacco Use  . Smoking status: Never Smoker  . Smokeless tobacco: Never Used  Substance Use Topics  . Alcohol use: No    Alcohol/week: 0.0 standard drinks  . Drug  use: No     Allergies   Lamictal [lamotrigine]; Nickel; and Wool alcohol [lanolin alcohol]   Review of Systems Review of Systems  Constitutional: Negative for chills, diaphoresis, fatigue and fever.  HENT: Negative for congestion, rhinorrhea and sneezing.   Eyes: Negative.   Respiratory: Negative for cough, chest tightness and shortness of breath.   Cardiovascular: Negative for chest pain and leg swelling.  Gastrointestinal: Positive for abdominal pain and nausea. Negative for blood in stool, diarrhea and vomiting.  Genitourinary: Negative for difficulty urinating, flank pain, frequency and hematuria.  Musculoskeletal: Negative for arthralgias and back pain.  Skin: Negative for rash.  Neurological: Negative for dizziness, speech difficulty, weakness, numbness and headaches.     Physical Exam Updated Vital Signs BP (!) 119/93   Pulse 69   Temp 98.3 F (36.8 C) (Oral)   Resp 18   SpO2 96%   Physical Exam  Constitutional: She is oriented to person, place, and time. She appears well-developed and well-nourished.  HENT:  Head: Normocephalic and atraumatic.  Eyes: Pupils are equal, round, and reactive to light.  Neck: Normal range of motion. Neck supple.  Cardiovascular: Normal rate, regular rhythm and normal heart sounds.  Pulmonary/Chest: Effort normal and breath sounds normal. No respiratory distress. She has no wheezes. She has no rales. She exhibits no tenderness.  Abdominal: Soft. Bowel sounds are normal. There is tenderness in the epigastric area. There is no rebound and no guarding.  Musculoskeletal: Normal range of motion. She exhibits no edema.  Lymphadenopathy:    She has no cervical adenopathy.  Neurological: She is alert and oriented to person, place, and time.  Skin: Skin is warm and dry. No rash noted.  Psychiatric: She has a normal mood and affect.     ED Treatments / Results  Labs (all labs ordered are listed, but only abnormal results are  displayed) Labs Reviewed  COMPREHENSIVE METABOLIC PANEL - Abnormal; Notable for the following components:      Result Value   Potassium 3.4 (*)    Glucose, Bld 144 (*)    All other components within normal limits  URINALYSIS, ROUTINE W REFLEX MICROSCOPIC - Abnormal; Notable for the following components:   Ketones, ur 5 (*)    Leukocytes, UA TRACE (*)    Bacteria, UA RARE (*)    All other components within normal limits  LIPASE, BLOOD  CBC  I-STAT BETA HCG BLOOD, ED ([facility], [facility], [facility] ONLY)    EKG None  Radiology No results found.  Procedures Procedures (including critical  care time)  Medications Ordered in ED Medications  famotidine (PEPCID) IVPB 20 mg premix (0 mg Intravenous Stopped 01/17/18 2057)  alum & mag hydroxide-simeth (MAALOX/MYLANTA) 200-200-20 MG/5ML suspension 30 mL (30 mLs Oral Given 01/17/18 1935)     Initial Impression / Assessment and Plan / ED Course  I have reviewed the triage vital signs and the nursing notes.  Pertinent labs & imaging results that were available during my care of the patient were reviewed by me and considered in my medical decision making (see chart for details).     Patient is feeling much better after Maalox and Pepcid in the ED.  She is status post cholecystectomy.  There is no evidence of pancreatitis.  Her other labs are non-concerning.  She was discharged home in good condition.  This is likely gastritis versus peptic ulcer disease.  She is given prescriptions for Prilosec and Carafate.  She was encouraged to follow-up with her PCP.  She was also given the name of a gastroenterologist that she could potentially follow-up with if her symptoms are not improving.  Final Clinical Impressions(s) / ED Diagnoses   Final diagnoses:  Acute gastritis without hemorrhage, unspecified gastritis type    ED Discharge Orders         Ordered    omeprazole (PRILOSEC) 20 MG capsule  Daily     01/17/18 2144    sucralfate (CARAFATE) 1 g tablet  3  times daily with meals & bedtime     01/17/18 2144           Malvin Johns, MD 01/17/18 2331

## 2018-01-17 NOTE — ED Triage Notes (Signed)
Pt reports epigastric pain x 1 week. She states that it comes and goes. Reports that it is generally worse in the morning and after eating. Denies vomiting or diarrhea, but endorses nausea. Pt does not have a gallbladder. A&Ox4. Ambulatory.

## 2018-01-24 ENCOUNTER — Other Ambulatory Visit (HOSPITAL_COMMUNITY): Payer: Self-pay | Admitting: Psychiatry

## 2018-01-26 ENCOUNTER — Other Ambulatory Visit (HOSPITAL_COMMUNITY): Payer: Self-pay

## 2018-01-26 MED ORDER — LURASIDONE HCL 40 MG PO TABS: 40.0000 mg | ORAL_TABLET | Freq: Every day | ORAL | 0 refills | Status: DC

## 2018-01-27 ENCOUNTER — Ambulatory Visit (HOSPITAL_COMMUNITY): Payer: Self-pay | Admitting: Psychology

## 2018-01-29 NOTE — Telephone Encounter (Signed)
Patient had appointment on November 4.  We will discuss 90-day supply on her appointment.

## 2018-02-01 ENCOUNTER — Ambulatory Visit (HOSPITAL_COMMUNITY): Payer: Self-pay | Admitting: Psychiatry

## 2018-02-02 ENCOUNTER — Emergency Department (HOSPITAL_COMMUNITY)
Admission: EM | Admit: 2018-02-02 | Discharge: 2018-02-02 | Disposition: A | Discharge status: Home / Self Care | Payer: Medicaid Other | Attending: Emergency Medicine | Admitting: Emergency Medicine

## 2018-02-02 ENCOUNTER — Encounter (HOSPITAL_COMMUNITY): Payer: Self-pay | Admitting: Emergency Medicine

## 2018-02-02 ENCOUNTER — Emergency Department (HOSPITAL_COMMUNITY): Payer: Medicaid Other

## 2018-02-02 DIAGNOSIS — Z7984 Long term (current) use of oral hypoglycemic drugs: Secondary | ICD-10-CM | POA: Diagnosis not present

## 2018-02-02 DIAGNOSIS — F329 Major depressive disorder, single episode, unspecified: Secondary | ICD-10-CM | POA: Diagnosis not present

## 2018-02-02 DIAGNOSIS — F419 Anxiety disorder, unspecified: Secondary | ICD-10-CM | POA: Diagnosis not present

## 2018-02-02 DIAGNOSIS — R1012 Left upper quadrant pain: Secondary | ICD-10-CM | POA: Diagnosis present

## 2018-02-02 DIAGNOSIS — E119 Type 2 diabetes mellitus without complications: Secondary | ICD-10-CM | POA: Insufficient documentation

## 2018-02-02 DIAGNOSIS — Z79899 Other long term (current) drug therapy: Secondary | ICD-10-CM | POA: Diagnosis not present

## 2018-02-02 DIAGNOSIS — Z9049 Acquired absence of other specified parts of digestive tract: Secondary | ICD-10-CM | POA: Diagnosis not present

## 2018-02-02 DIAGNOSIS — N12 Tubulo-interstitial nephritis, not specified as acute or chronic: Secondary | ICD-10-CM

## 2018-02-02 DIAGNOSIS — N10 Acute tubulo-interstitial nephritis: Secondary | ICD-10-CM | POA: Diagnosis not present

## 2018-02-02 LAB — I-STAT BETA HCG BLOOD, ED (MC, WL, AP ONLY)

## 2018-02-02 LAB — URINALYSIS, ROUTINE W REFLEX MICROSCOPIC
BILIRUBIN URINE: NEGATIVE
Glucose, UA: NEGATIVE mg/dL
HGB URINE DIPSTICK: NEGATIVE
KETONES UR: 20 mg/dL — AB
NITRITE: NEGATIVE
PROTEIN: 30 mg/dL — AB
Specific Gravity, Urine: 1.03 (ref 1.005–1.030)
pH: 5 (ref 5.0–8.0)

## 2018-02-02 LAB — COMPREHENSIVE METABOLIC PANEL
ALBUMIN: 4.6 g/dL (ref 3.5–5.0)
ALT: 17 U/L (ref 0–44)
AST: 17 U/L (ref 15–41)
Alkaline Phosphatase: 56 U/L (ref 38–126)
Anion gap: 10 (ref 5–15)
BUN: 15 mg/dL (ref 6–20)
CO2: 24 mmol/L (ref 22–32)
CREATININE: 0.64 mg/dL (ref 0.44–1.00)
Calcium: 9.4 mg/dL (ref 8.9–10.3)
Chloride: 106 mmol/L (ref 98–111)
GFR calc Af Amer: >60 mL/min (ref >60)
GFR calc non Af Amer: >60 mL/min (ref >60)
GLUCOSE: 126 mg/dL — AB (ref 70–99)
POTASSIUM: 3.7 mmol/L (ref 3.5–5.1)
SODIUM: 140 mmol/L (ref 135–145)
Total Bilirubin: 0.8 mg/dL (ref 0.3–1.2)
Total Protein: 7.9 g/dL (ref 6.5–8.1)

## 2018-02-02 LAB — CBC
HEMATOCRIT: 39.9 % (ref 36.0–46.0)
HEMOGLOBIN: 13.7 g/dL (ref 12.0–15.0)
MCH: 29.3 pg (ref 26.0–34.0)
MCHC: 34.3 g/dL (ref 30.0–36.0)
MCV: 85.4 fL (ref 80.0–100.0)
Platelets: 338 10*3/uL (ref 150–400)
RBC: 4.67 MIL/uL (ref 3.87–5.11)
RDW: 12.1 % (ref 11.5–15.5)
WBC: 9.6 10*3/uL (ref 4.0–10.5)
nRBC: 0 % (ref 0.0–0.2)

## 2018-02-02 LAB — LIPASE, BLOOD: LIPASE: 26 U/L (ref 11–51)

## 2018-02-02 MED ORDER — ONDANSETRON 4 MG PO TBDP: 4.0000 mg | ORAL_TABLET | Freq: Three times a day (TID) | ORAL | 0 refills | Status: DC | PRN

## 2018-02-02 MED ORDER — CEPHALEXIN 500 MG PO CAPS: 500.0000 mg | ORAL_CAPSULE | Freq: Two times a day (BID) | ORAL | 0 refills | Status: AC

## 2018-02-02 MED ORDER — METOCLOPRAMIDE HCL 5 MG/ML IJ SOLN
10.0000 mg | Freq: Once | INTRAMUSCULAR | Status: AC
  Administered 2018-02-02: 10 mg via INTRAVENOUS
  Filled 2018-02-02: qty 2

## 2018-02-02 MED ORDER — SODIUM CHLORIDE 0.9 % IV BOLUS
1000.0000 mL | Freq: Once | INTRAVENOUS | Status: AC
  Administered 2018-02-02: 1000 mL via INTRAVENOUS

## 2018-02-02 MED ORDER — DIPHENHYDRAMINE HCL 50 MG/ML IJ SOLN
25.0000 mg | Freq: Once | INTRAMUSCULAR | Status: AC
  Administered 2018-02-02: 25 mg via INTRAVENOUS
  Filled 2018-02-02: qty 1

## 2018-02-02 NOTE — ED Triage Notes (Signed)
Pt reports that she was here Oct 20th with mid abd pains and prescribed medication for ulcer that patient reports she didn't take.  Past 4 days had LUQ pain with diarrhea and vomiting yesterday and vomiting today.

## 2018-02-02 NOTE — ED Provider Notes (Signed)
[facility] DEPT Provider Note   CSN: [id number] Arrival date & time: 02/02/18  1023     History   Chief Complaint Chief Complaint  Patient presents with  . Abdominal Pain  . Emesis    HPI Vicki Huynh is a 37 y.o. female.  HPI   4 days of left upper quadrant abdominal pain, radiating to back when most severe Vomiting for last 2-3 days, especially in AM, worse in AM Talked to doctor about epigastric pain This AM pain was severe, in tears, doubled over in pain Waxing and waning pain, stabbing pain, more constant than previous No hematemesis Diarrhea yesterday BM loose this AM No dysuria Nothing makes the pain better or worse, hurts before eating then hurts after, not affected Taking nexium, didn't take for the last 2 days Throwing up in AM Headaches dull, worse before bed  Past Medical History:  Diagnosis Date  . Anxiety   . Depression   . Diabetic mellitus, gestational     Patient Active Problem List   Diagnosis Date Noted  . Menorrhagia with regular cycle 10/09/2014  . Dysmenorrhea 10/09/2014  . Pelvic pain in female 10/09/2014  . 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 Pain in joint, upper arm 02/06/2014  . Paresthesias 02/06/2014    Past Surgical History:  Procedure Laterality Date  . CHOLECYSTECTOMY    . HYSTEROSCOPY WITH NOVASURE N/A 10/09/2014   Procedure: HYSTEROSCOPY WITH NOVASURE;  Surgeon: Cheri Fowler, MD;  Location: [facility];  Service: Gynecology;  Laterality: N/A;  . LAPAROSCOPIC BILATERAL SALPINGECTOMY Bilateral 10/09/2014   Procedure: LAPAROSCOPIC BILATERAL SALPINGECTOMY with removal of Essure coils;  Surgeon: Cheri Fowler, MD;  Location: [facility];  Service: Gynecology;  Laterality: Bilateral;  1hr OR time  . WISDOM TOOTH EXTRACTION       OB History    Gravida  4   Para  2   Term  2   Preterm      AB  2   Living  2     SAB  1   TAB  1   Ectopic      Multiple      Live Births                Home Medications    Prior to Admission medications   Medication Sig Start Date End Date Taking? Authorizing Provider  acetaminophen (TYLENOL) 500 MG tablet Take 1,500 mg by mouth every 6 (six) hours as needed for mild pain or headache.   Yes [provider]  bismuth subsalicylate (PEPTO BISMOL) 262 MG chewable tablet Chew 524 mg by mouth daily as needed for indigestion.   Yes [provider]  Calcium Carbonate Antacid (ANTACID) 1177 MG CHEW Chew 2 tablets by mouth daily as needed (indigestion).   Yes [provider]  losartan (COZAAR) 25 MG tablet Take 25 mg by mouth every evening.  12/16/17  Yes [provider]  lurasidone (LATUDA) 40 MG TABS tablet Take 1 tablet (40 mg total) by mouth daily with breakfast. Patient taking differently: Take 40 mg by mouth at bedtime.  01/26/18  Yes Arfeen, Arlyce Harman, MD  metFORMIN (GLUCOPHAGE-XR) 500 MG 24 hr tablet Take 500 mg by mouth every evening.  12/28/17  Yes [provider]  omeprazole (PRILOSEC) 20 MG capsule Take 1 capsule (20 mg total) by mouth daily. 01/17/18  Yes Malvin Johns, MD  rosuvastatin (CRESTOR) 5 MG tablet Take 5 mg by mouth every other day. 12/28/17  Yes [provider]  ACCU-CHEK FASTCLIX LANCETS MISC  06/20/17   [provider]  ACCU-CHEK GUIDE test strip  06/20/17   [provider]  cephALEXin (KEFLEX) 500 MG capsule Take 1 capsule (500 mg total) by mouth 2 (two) times daily for 10 days. 02/02/18 02/12/18  Gareth Morgan, MD  ondansetron (ZOFRAN ODT) 4 MG disintegrating tablet Take 1 tablet (4 mg total) by mouth every 8 (eight) hours as needed for nausea or vomiting. 02/02/18   Gareth Morgan, MD  sucralfate (CARAFATE) 1 g tablet Take 1 tablet (1 g total) by mouth 4 (four) times daily -  with meals and at bedtime. Patient not taking: Reported on 02/02/2018 01/17/18   Malvin Johns, MD    Family History Family History  Problem Relation Age of Onset  . Rheum  arthritis Mother   . Rheum arthritis Father   . Alcohol abuse Father        in recovery  . Depression Father   . Drug abuse Brother     Social History Social History   Tobacco Use  . Smoking status: Never Smoker  . Smokeless tobacco: Never Used  Substance Use Topics  . Alcohol use: No    Alcohol/week: 0.0 standard drinks  . Drug use: No     Allergies   Lamictal [lamotrigine]; Nickel; and Wool alcohol [lanolin alcohol]   Review of Systems Review of Systems  Constitutional: Negative for fever.  HENT: Negative for sore throat.   Eyes: Negative for visual disturbance.  Respiratory: Negative for cough and shortness of breath.   Cardiovascular: Negative for chest pain.  Gastrointestinal: Positive for abdominal pain, diarrhea, nausea and vomiting. Negative for constipation.  Genitourinary: Negative for difficulty urinating and dysuria.  Musculoskeletal: Negative for back pain and neck pain.  Skin: Negative for rash.  Neurological: Positive for headaches. Negative for syncope.     Physical Exam Updated Vital Signs BP 115/84 (BP Location: Right Arm)   Pulse 90   Temp 98.3 F (36.8 C) (Oral)   Resp 16   LMP 01/12/2018   SpO2 99%   Physical Exam  Constitutional: She is oriented to person, place, and time. She appears well-developed and well-nourished. No distress.  HENT:  Head: Normocephalic and atraumatic.  Eyes: Conjunctivae and EOM are normal.  Neck: Normal range of motion.  Cardiovascular: Normal rate, regular rhythm, normal heart sounds and intact distal pulses. Exam reveals no gallop and no friction rub.  No murmur heard. Pulmonary/Chest: Effort normal and breath sounds normal. No respiratory distress. She has no wheezes. She has no rales.  Abdominal: Soft. She exhibits no distension. There is tenderness in the epigastric area and left upper quadrant. There is no guarding and no CVA tenderness (not at this time).  Musculoskeletal: She exhibits no edema or  tenderness.  Neurological: She is alert and oriented to person, place, and time.  Skin: Skin is warm and dry. No rash noted. She is not diaphoretic. No erythema.  Nursing note and vitals reviewed.    ED Treatments / Results  Labs (all labs ordered are listed, but only abnormal results are displayed) Labs Reviewed  COMPREHENSIVE METABOLIC PANEL - Abnormal; Notable for the following components:      Result Value   Glucose, Bld 126 (*)    All other components within normal limits  URINALYSIS, ROUTINE W REFLEX MICROSCOPIC - Abnormal; Notable for the following components:   APPearance HAZY (*)    Ketones, ur 20 (*)    Protein, ur 30 (*)  Leukocytes, UA MODERATE (*)    Bacteria, UA MANY (*)    All other components within normal limits  LIPASE, BLOOD  CBC  I-STAT BETA HCG BLOOD, ED ([facility], [facility], [facility] ONLY)    EKG None  Radiology Ct Renal Stone Study  Result Date: 02/02/2018 CLINICAL DATA:  Left flank and left upper quadrant abdominal pain. EXAM: CT ABDOMEN AND PELVIS WITHOUT CONTRAST TECHNIQUE: Multidetector CT imaging of the abdomen and pelvis was performed following the standard protocol without IV contrast. COMPARISON:  None. FINDINGS: Lower chest: Normal. Hepatobiliary: No focal liver abnormality is seen. Status post cholecystectomy. No biliary dilatation. Pancreas: Unremarkable. No pancreatic ductal dilatation or surrounding inflammatory changes. Spleen: Normal in size without focal abnormality. Adrenals/Urinary Tract: There is a 2 mm calcification in the lower pole of the left kidney. 9 mm exophytic cyst on the lower pole of the left kidney. Right kidney is normal. Adrenal glands are normal. No hydronephrosis. Bladder appears normal. Stomach/Bowel: Stomach is within normal limits. Appendix appears normal. No evidence of bowel wall thickening, distention, or inflammatory changes. Vascular/Lymphatic: No significant vascular findings are present. No enlarged abdominal or pelvic lymph nodes.  Reproductive: No significant abnormality of the uterus or ovaries. Small cystic area at the right side of the uterine fundus may be related to prior surgery. Small metallic density adjacent to the cyst. No adnexal masses. Other: No abdominal wall hernia or abnormality. No abdominopelvic ascites. Musculoskeletal: No acute or significant osseous findings. IMPRESSION: Benign-appearing abdomen and pelvis. Tiny subtle calcification in the lower pole of the left kidney. Electronically Signed   By: Lorriane Shire M.D.   On: 02/02/2018 12:04    Procedures Procedures (including critical care time)  Medications Ordered in ED Medications  sodium chloride 0.9 % bolus 1,000 mL (0 mLs Intravenous Stopped 02/02/18 1213)  metoCLOPramide (REGLAN) injection 10 mg (10 mg Intravenous Given 02/02/18 1113)  diphenhydrAMINE (BENADRYL) injection 25 mg (25 mg Intravenous Given 02/02/18 1114)     Initial Impression / Assessment and Plan / ED Course  I have reviewed the triage vital signs and the nursing notes.  Pertinent labs & imaging results that were available during my care of the patient were reviewed by me and considered in my medical decision making (see chart for details).     37yo female with history above and pt reports also hx of DM presents with concern for left upper quadrant abdominal pain radiating to the flank.  Labs show no evidence of pancreatitis or hepatitis.  Exam is not consistent with cholangitis, appendicitis, and have low suspicion for ovarian torsion, PID given location of pain.  CT stone study done shows no evidence of nephrolithiasis, or other acute abnormalities.  Urinalysis completed is concerning for urinary tract infection.  Given abdominal, flank pain, nausea and vomiting, suspect most likely pyelonephritis as etiology of patient's symptoms.  She is given a prescription for 10 days of Keflex, and Zofran.  Discussed given the symptoms she had had previously, suspect there may also be an  element of gastritis or peptic ulcer disease, recommend she continue Nexium as had been previously prescribed.  She is given IV fluids, Reglan in the emergency department with improvement of symptoms. Patient discharged in stable condition with understanding of reasons to return.   Final Clinical Impressions(s) / ED Diagnoses   Final diagnoses:  Pyelonephritis  Abdominal pain, left upper quadrant    ED Discharge Orders         Ordered    cephALEXin (KEFLEX) 500 MG  capsule  2 times daily     02/02/18 1258    ondansetron (ZOFRAN ODT) 4 MG disintegrating tablet  Every 8 hours PRN     02/02/18 1258           Gareth Morgan, MD 02/03/18 [phone]

## 2018-02-10 ENCOUNTER — Ambulatory Visit (INDEPENDENT_AMBULATORY_CARE_PROVIDER_SITE_OTHER): Payer: Medicaid Other | Admitting: Psychology

## 2018-02-10 DIAGNOSIS — F3160 Bipolar disorder, current episode mixed, unspecified: Secondary | ICD-10-CM

## 2018-02-10 NOTE — Progress Notes (Signed)
   THERAPIST PROGRESS NOTE  Session Time: 9.06am-9.53am  Participation Level: Active  Behavioral Response: Well GroomedAlertAnxious  Type of Therapy: Individual Therapy  Treatment Goals addressed: Diagnosis: Bipolar 1 d/o and goal 1.  Interventions: CBT and Supportive  Summary: Vicki Huynh is a 37 y.o. female who presents with anxiety.  Pt reported that she was home all last week as son was sick w/ upset stomach and 1 week ago ended up at ER for UTI and Kidney infection.  Pt reported she has had bad anxiety this past week and feels that has been related to taking the antibiotic.  Pt reported that no panic attacks just has had heaviness and feeling unsettled.  Pt aware that being sick and off schedule can also be effecting.  Pt reported that she is feeling anxious about her husband returning to work and prefers he stay out w/ her through the holidays.  Pt increased aware that will feel anxious at that time as well and that delaying won't change this transition. Pt discussed how she was able to manage and reframes that she will be able to manage again w/out him home.  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   Suicidal/Homicidal: Nowithout intent/plan  Therapist Response: assessed pt current functioning per pt report. Processed w/pt increased anxiety and potential factors.  Explored w/pt use of coping skills and communicating w/ husband- explaining not seeking him to solve.  discussed pt routine returning to and how this may assist.  Explored w/pt facing anxiety instead of avoidance.  Plan: Return again in 2 weeks.  Diagnosis: Bipolar 1 d/o   YATES,LEANNE, LPC 02/10/2018

## 2018-02-22 ENCOUNTER — Other Ambulatory Visit (HOSPITAL_COMMUNITY): Payer: Self-pay | Admitting: Psychiatry

## 2018-02-22 ENCOUNTER — Ambulatory Visit (INDEPENDENT_AMBULATORY_CARE_PROVIDER_SITE_OTHER): Payer: Medicaid Other | Admitting: Psychology

## 2018-02-22 DIAGNOSIS — F3160 Bipolar disorder, current episode mixed, unspecified: Secondary | ICD-10-CM

## 2018-02-22 NOTE — Progress Notes (Signed)
   THERAPIST PROGRESS NOTE  Session Time: 9.05am-10.05am  Participation Level: Active  Behavioral Response: Well GroomedAlertaffect bright  Type of Therapy: Individual Therapy  Treatment Goals addressed: Diagnosis: Bipolar 1 d/o and goal 1.  Interventions: CBT and Supportive  Summary: Vicki Huynh is a 37 y.o. female who presents with affect wnl.  Pt reported that yesterday she had an episode of sobbing for no reason.  Pt reported she didn't have any triggers- just strong feeling of being overwhelmed.  Pt reported this lasted about 1 hour.  Pt reported asked for husband to sit w/ her and arm around. Pt reported this was helpful till saw he was scrolling on phone- then wasn't helpful.  Pt was able to identify some things that are helpful to her are keeping engaged w/ things outside of the home or even in the home.  Pt reports too much down time is unsettling because feels like could be doing.  Pt agreed to seek balance between engaging and finding was of rest- accepting of that.  Pt also discussed possible mantra for soothing focus when feeling anxious and ways of asserting to value herself..   Suicidal/Homicidal: Nowithout intent/plan  Therapist Response: Assessed pt current functioning per pt report. Processed coping w/ pt anxiety attack and discussed ways of deescalating for self during those time and expressing needs to others.  Explored stressors over past week.  Discussed balance of engaging and resting and challenging distorted expectations.   Plan: Return again in 2 weeks.  Diagnosis: Bipolar 1 d/o   YATES,LEANNE, LPC 02/22/2018

## 2018-02-24 ENCOUNTER — Other Ambulatory Visit (HOSPITAL_COMMUNITY): Payer: Self-pay

## 2018-02-24 MED ORDER — LURASIDONE HCL 40 MG PO TABS: 40.0000 mg | ORAL_TABLET | Freq: Every day | ORAL | 0 refills | Status: DC

## 2018-03-11 ENCOUNTER — Encounter (HOSPITAL_COMMUNITY): Payer: Self-pay | Admitting: Psychiatry

## 2018-03-11 ENCOUNTER — Ambulatory Visit (INDEPENDENT_AMBULATORY_CARE_PROVIDER_SITE_OTHER): Payer: Medicaid Other | Admitting: Psychiatry

## 2018-03-11 VITALS — BP 122/70 | HR 74 | Ht 66.5 in | Wt 189.6 lb

## 2018-03-11 DIAGNOSIS — F419 Anxiety disorder, unspecified: Secondary | ICD-10-CM

## 2018-03-11 DIAGNOSIS — F3160 Bipolar disorder, current episode mixed, unspecified: Secondary | ICD-10-CM

## 2018-03-11 MED ORDER — LURASIDONE HCL 40 MG PO TABS: 40.0000 mg | ORAL_TABLET | Freq: Every day | ORAL | 2 refills | Status: DC

## 2018-03-11 MED ORDER — HYDROXYZINE HCL 10 MG PO TABS: 10.0000 mg | ORAL_TABLET | Freq: Every day | ORAL | 0 refills | Status: DC | PRN

## 2018-03-11 NOTE — Progress Notes (Signed)
BH MD/PA/NP OP Progress Note  03/11/2018 10:31 AM Vicki Huynh  MRN:  [id number]  Chief Complaint: Doing okay but sometime I am anxious and wake up in the night.  HPI: Vicki Huynh came for her follow-up appointment.  She is taking Latuda 40 mg is helping her mood swings, irritability, mania or psychosis.  Overall she reported her mood is a stable but sometimes she feels anxious and in the night she wakes up and difficult to fall back to sleep.  She feels that medicine is working.  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  She is pleased that her son who diagnosed with autism doing very well and getting some help from the school.  Patient saw her primary care physician in July and reported her hemoglobin A1c was much better.  She also lost 20 pounds since the May.  She is more active, social, having good energy level.  She is watching her appetite and stop eating sweet and high carb.  She lives with her husband who is supportive.  Patient denies any agitation, suicidal thoughts or any aggressive behavior.  She denies drinking or using any illegal substances.  She has no concern from [place].  Visit Diagnosis:    ICD-10-CM   1. Bipolar 1 disorder, mixed (HCC) F31.60 lurasidone (LATUDA) 40 MG TABS tablet  2. Anxiety F41.9 hydrOXYzine (ATARAX/VISTARIL) 10 MG tablet    Past Psychiatric History: Reviewed. No history of psychiatric inpatient treatment or any suicidal attempt. Had a history of depression, mood swings, irritability, highs and lows, road rage and impulsive behavior. PCP prescribed Effexor in 2012 which worked for a few years until stopped working. It was switched to Lamictal however when dose increased she started to have hallucination and it was stopped.   We tried [place] but when dose increased to 60 mg she could not tolerate side and had insomnia.  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  Past Medical History:  Past Medical  History:  Diagnosis Date  . Anxiety   . Depression   . Diabetic mellitus, gestational     Past Surgical History:  Procedure Laterality Date  . CHOLECYSTECTOMY    . HYSTEROSCOPY WITH NOVASURE N/A 10/09/2014   Procedure: HYSTEROSCOPY WITH NOVASURE;  Surgeon: Cheri Fowler, MD;  Location: [facility];  Service: Gynecology;  Laterality: N/A;  . LAPAROSCOPIC BILATERAL SALPINGECTOMY Bilateral 10/09/2014   Procedure: LAPAROSCOPIC BILATERAL SALPINGECTOMY with removal of Essure coils;  Surgeon: Cheri Fowler, MD;  Location: [facility];  Service: Gynecology;  Laterality: Bilateral;  1hr OR time  . WISDOM TOOTH EXTRACTION      Family Psychiatric History: Reviewed.  Family History:  Family History  Problem Relation Age of Onset  . Rheum arthritis Mother   . Rheum arthritis Father   . Alcohol abuse Father        in recovery  . Depression Father   . Drug abuse Brother     Social History:  Social History   Socioeconomic History  . Marital status: Married    Spouse name: Not on file  . Number of children: Not on file  . Years of education: Not on file  . Highest education level: Not on file  Occupational History  . Not on file  Social Needs  . Financial resource strain: Not on file  . Food insecurity:    Worry: Not on file    Inability: Not on file  . Transportation  needs:    Medical: Not on file    Non-medical: Not on file  Tobacco Use  . Smoking status: Never Smoker  . Smokeless tobacco: Never Used  Substance and Sexual Activity  . Alcohol use: No    Alcohol/week: 0.0 standard drinks  . Drug use: No  . Sexual activity: Yes    Birth control/protection: Surgical  Lifestyle  . Physical activity:    Days per week: Not on file    Minutes per session: Not on file  . Stress: Not on file  Relationships  . Social connections:    Talks on phone: Not on file    Gets together: Not on file    Attends religious service: Not on file    Active member of club or organization: Not on file     Attends meetings of clubs or organizations: Not on file    Relationship status: Not on file  Other Topics Concern  . Not on file  Social History Narrative  . Not on file    Allergies:  Allergies  Allergen Reactions  . Lamictal [Lamotrigine] Other (See Comments)    Hallucination  . Nickel Rash  . Wool Alcohol [Lanolin Alcohol] Itching    Metabolic Disorder Labs: No results found for: HGBA1C, MPG No results found for: PROLACTIN No results found for: CHOL, TRIG, HDL, CHOLHDL, VLDL, LDLCALC No results found for: TSH  Therapeutic Level Labs: No results found for: LITHIUM No results found for: VALPROATE No components found for:  CBMZ  Current Medications: Current Outpatient Medications  Medication Sig Dispense Refill  . ACCU-CHEK FASTCLIX LANCETS MISC   6  . ACCU-CHEK GUIDE test strip   6  . bismuth subsalicylate (PEPTO BISMOL) 262 MG chewable tablet Chew 524 mg by mouth daily as needed for indigestion.    . Calcium Carbonate Antacid (ANTACID) 1177 MG CHEW Chew 2 tablets by mouth daily as needed (indigestion).    Marland Kitchen losartan (COZAAR) 25 MG tablet Take 25 mg by mouth every evening.   0  . lurasidone (LATUDA) 40 MG TABS tablet Take 1 tablet (40 mg total) by mouth daily with breakfast. 30 tablet 0  . metFORMIN (GLUCOPHAGE-XR) 500 MG 24 hr tablet Take 500 mg by mouth every evening.   0  . rosuvastatin (CRESTOR) 5 MG tablet Take 5 mg by mouth every other day.  0  . omeprazole (PRILOSEC) 20 MG capsule Take 1 capsule (20 mg total) by mouth daily. (Patient not taking: Reported on 03/11/2018) 30 capsule 0   No current facility-administered medications for this visit.      Musculoskeletal: Strength & Muscle Tone: within normal limits Gait & Station: normal Patient leans: N/A  Psychiatric Specialty Exam: ROS  Blood pressure 122/70, pulse 74, height 5' 6.5" (1.689 m), weight 189 lb 9.6 oz (86 kg).Body mass index is 30.14 kg/m.  General Appearance: Casual  Eye Contact:  Good   Speech:  Clear and Coherent  Volume:  Normal  Mood:  Anxious  Affect:  Appropriate  Thought Process:  Goal Directed  Orientation:  Full (Time, Place, and Person)  Thought Content: Logical   Suicidal Thoughts:  No  Homicidal Thoughts:  No  Memory:  Immediate;   Good Recent;   Fair Remote;   Good  Judgement:  Good  Insight:  Good  Psychomotor Activity:  Normal  Concentration:  Concentration: Good and Attention Span: Good  Recall:  Good  Fund of Knowledge: Good  Language: Good  Akathisia:  No  Handed:  Right  AIMS (if indicated): not done  Assets:  Communication Skills Desire for Improvement Housing Resilience Social Support  ADL's:  Intact  Cognition: WNL  Sleep:  Fair   Screenings: PHQ2-9     Nutrition from 01/31/2016 in [facility]  PHQ-2 Total Score  0       Assessment and Plan: Bipolar disorder type I.  Anxiety.  Patient is a stable on Latuda 40 mg daily.  She feels sometimes anxious and I recommended to add low-dose hydroxyzine to help her anxiety and insomnia.  We will start hydroxyzine 10 mg and she can take 1 to 2 tablet as needed.  Patient lost weight from the past and mentioned that her blood sugar is also improved.  Encouraged to continue to watch her calorie intake and do regular exercise.  She is seeing [name] for therapy.  Recommended to call us back if she is any question or any concern.  Follow-up in 3 months.   [name], MD 03/11/2018, 10:31 AM

## 2018-03-15 ENCOUNTER — Ambulatory Visit (INDEPENDENT_AMBULATORY_CARE_PROVIDER_SITE_OTHER): Payer: Medicaid Other | Admitting: Psychology

## 2018-03-15 DIAGNOSIS — F3160 Bipolar disorder, current episode mixed, unspecified: Secondary | ICD-10-CM | POA: Diagnosis not present

## 2018-03-15 DIAGNOSIS — F419 Anxiety disorder, unspecified: Secondary | ICD-10-CM | POA: Diagnosis not present

## 2018-03-15 NOTE — Progress Notes (Signed)
   THERAPIST PROGRESS NOTE  Session Time: 9.55am-10.50am  Participation Level: Active  Behavioral Response: Well GroomedAlertaffect wnl  Type of Therapy: Individual Therapy  Treatment Goals addressed: Diagnosis: Bipolar 1 d/o and goal 1.  Interventions: CBT and Supportive  Summary: Vicki Huynh is a 37 y.o. female who presents with affect wnl.  Pt reported that she tried the Vistaril prescribed by Dr. Adele Schilder and didn't like how left feeling groggy and the a.m.  Pt hasn't taken again.  Pt is aware she can talk w/ Dr. Adele Schilder to discuss if needed.  Pt reported that she has seen a pattern w/ anxiety increased w/ menstrual cycle and experienced that last week.  Pt reported that she visited w/ mom the other week as mom invited over. Pt reported that went ok- mom still made her comments-s he just didn't allow to bother and moved conversation on.  Pt became aware she will need to plan for entertainment of youngest as mom didn't and was more challenging.  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  Pt discussed upcoming holidays and focused on being positive- reframing negatives/challenges.  Suicidal/Homicidal: Nowithout intent/plan  Therapist Response: Assessed pt current functioning per pt report.  Processed w/pt her challenges and stressors.  Reflected pt good insight about patterns noticing and triggers.  Discussed use of coping skills and focus on what is in her control and manageable.  Plan: Return again in 2 weeks.  Diagnosis: Bipolar 1 d/o Vernita Tague, [place] 03/15/2018

## 2018-04-07 ENCOUNTER — Ambulatory Visit (INDEPENDENT_AMBULATORY_CARE_PROVIDER_SITE_OTHER): Payer: Medicaid Other | Admitting: Psychology

## 2018-04-07 DIAGNOSIS — F3160 Bipolar disorder, current episode mixed, unspecified: Secondary | ICD-10-CM

## 2018-04-07 DIAGNOSIS — F419 Anxiety disorder, unspecified: Secondary | ICD-10-CM | POA: Diagnosis not present

## 2018-04-07 NOTE — Progress Notes (Signed)
   THERAPIST PROGRESS NOTE  Session Time: 9am-10.10am  Participation Level: Active  Behavioral Response: Well GroomedAlertaffect wnl  Type of Therapy: Individual Therapy  Treatment Goals addressed: Diagnosis: bipolar and anxiety\  Interventions: CBT, Assertiveness Training and Other: relaxation skills  Summary: LYNSIE MCWATTERS is a 38 y.o. female who presents with affect wnl.  Pt reported that break was good- enjoyed time w/ her boys and husband.  Pt reported that Christmas dinner went well w/ mom and felt good about.  Pt reported that last weekend mom wanted to go shopping w/ them and that didn't go so well- son got hungry and mom still wanted to shop before going to lunch.  Pt reported created a lot of anxiety and attempted to express to mom son's needs but mom didn't understand.  Pt discussed ways can assert and offer ideas for solving w/ mom next time.  Pt discussed how sleep has been off w/ returning to a school schedule.  Pt receptive to ideas for sleep hygiene and relaxation strategies when having difficulty falling asleep..   Suicidal/Homicidal: Nowithout intent/plan  Therapist Response: Assessed pt current functioning per pt report. Processed w/pt coping w/ interactions w/ mom and ways of asserting self- assisted pt w/ identifying.  Discussed change in routine from break to school schedule and impact may have on sleep.  Discussed good sleep hygiene and had pt identify things she could change.  Introduced some relaxation strategies as well.  Plan: Return again in 2 weeks.  Diagnosis: Bipolar 1 d/o   Azarius Lambson, LPC 04/07/2018

## 2018-04-20 ENCOUNTER — Ambulatory Visit (INDEPENDENT_AMBULATORY_CARE_PROVIDER_SITE_OTHER): Payer: Medicaid Other | Admitting: Psychology

## 2018-04-20 DIAGNOSIS — F3160 Bipolar disorder, current episode mixed, unspecified: Secondary | ICD-10-CM

## 2018-04-20 NOTE — Progress Notes (Signed)
   THERAPIST PROGRESS NOTE  Session Time: 9.10am-10am  Participation Level: Active  Behavioral Response: Well GroomedAlertAnxious  Type of Therapy: Individual Therapy  Treatment Goals addressed: Diagnosis: Bipolar d/o, anxiety and goal 1.  Interventions: CBT and Other: grounding practices  Summary: Vicki Huynh is a 38 y.o. female who presents with affect congruent w/ report of increased anxiety. Pt has psychomotor restlessness and teary eyed as she discussed increased anxiety that both dad and husband have notice.  Pt reported no increased stressors or other particular thoughts or worries- just feeling of uneasy, overwhelmed- anxious.  Pt was receptive to grounding activities and practiced in session.  Pt psychomotor activity decreased, affect improved and pt reported feeling more at ease.  Pt agrees to use of practices throughout the day. Pt is able to report that sleep also has been off some recent.  Pt is trying to avoid looking at change in medication but recognizes may need to talk to Dr. Adele Schilder if continues to worsen or not improve.  Pt discussed some recent interactions w/ mom.  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    Suicidal/Homicidal: Nowithout intent/plan  Therapist Response: ASsessed pt current functioning per pt report. Processed w/pt increased anxiety and explored stressors, interactions and thoughts.  Led pt through grounding activities pairing slow movements w/ breath, breath work and mindfulness.  Discussed need to develop consistent practice daily of grounding activities to assist w/ deescalating anxiety.   Plan: Return again in 2 weeks.  Diagnosis: Bipolar 1 d/o   YATES,LEANNE, LPC 04/20/2018

## 2018-05-03 ENCOUNTER — Ambulatory Visit (INDEPENDENT_AMBULATORY_CARE_PROVIDER_SITE_OTHER): Payer: Medicaid Other | Admitting: Psychology

## 2018-05-03 DIAGNOSIS — F419 Anxiety disorder, unspecified: Secondary | ICD-10-CM

## 2018-05-03 DIAGNOSIS — F3160 Bipolar disorder, current episode mixed, unspecified: Secondary | ICD-10-CM

## 2018-05-03 NOTE — Progress Notes (Signed)
   THERAPIST PROGRESS NOTE  Session Time: 10am-10.50am  Participation Level: Active  Behavioral Response: Well GroomedAlertaffect wnl  Type of Therapy: Individual Therapy  Treatment Goals addressed: Diagnosis: Bipolar 1 and goal 1.  Interventions: CBT and Supportive  Summary: Vicki Huynh is a 38 y.o. female who presents with affect wnl.  Pt less anxious in today's visit and reports that anxiety has been improved over the past week.  Pt reported that when a worry has come up she has been able to reframe.  Pt reported that she was able to text mom and discuss getting together- mom responded and she will f/u this week about.  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  Pt felt that the librarian might have been upset w/her for not helping more than one day last week and asked and received feedback that wasn't.  Pt reported that did have to fix a flat last week and thought that bad things keep happening is coming up.  Pt was able to challenge thought w/ some bad things will happen but also other good things.  Suicidal/Homicidal: Nowithout intent/plan  Therapist Response: Assessed pt current functioning per pt report. Reflected improvement in anxiety from last session.  Discussed w/ pt things she has been able to do and discussed worries and negative thoughts that have emerged and assisted pt w/ challenging and reframing.  Plan: Return again in 2 weeks.  Diagnosis: Bipolar 1 d/o and anxiety   Advay Volante, LPC 05/03/2018

## 2018-05-18 ENCOUNTER — Ambulatory Visit (INDEPENDENT_AMBULATORY_CARE_PROVIDER_SITE_OTHER): Payer: Medicaid Other | Admitting: Psychology

## 2018-05-18 DIAGNOSIS — F419 Anxiety disorder, unspecified: Secondary | ICD-10-CM | POA: Diagnosis not present

## 2018-05-18 DIAGNOSIS — F3160 Bipolar disorder, current episode mixed, unspecified: Secondary | ICD-10-CM

## 2018-05-18 NOTE — Progress Notes (Signed)
   THERAPIST PROGRESS NOTE  Session Time: 10am-11am  Participation Level: Active  Behavioral Response: Well GroomedAlert- affect down  Type of Therapy: Individual Therapy  Treatment Goals addressed: Diagnosis: bipolar, anxiety and goal 1.  Interventions: CBT and Supportive  Summary: Vicki Huynh is a 38 y.o. female who presents with affect depressed.  Pt reported that she just didn't feel good today- felt that might have been getting sick- but know feeling that might just be mood.  Pt reported that over weekend felt a little off- missed her diabetic medication one night- did effect w/ upset stomach next day.  Pt reported that she isn't getting good sleep w/ son difficulty falling asleep and other son sick over weekend. Pt reported that she chose to not go into school to volunteer and to take a mental health day and care for self. Pt reports she will go back to volunteer tomorrow.  Pt reports no significant anxiety.  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  Pt reported reinforced that no one will be mad at her just because she says no to a request.     Suicidal/Homicidal: Nowithout intent/plan  Therapist Response: Assessed pt current functioning per pt report. Processed w/pt mood and potential contributing factors and ways of self care today.  Explored w/ pt interactions and impact of asserting self w/ positive outcomes.  Plan: Return again in 2 weeks.  Diagnosis: Bipolar 1 d/o and GAD   [name], LPC 05/18/2018

## 2018-06-01 ENCOUNTER — Encounter (HOSPITAL_COMMUNITY): Payer: Self-pay | Admitting: Psychology

## 2018-06-01 ENCOUNTER — Ambulatory Visit (HOSPITAL_COMMUNITY): Payer: Medicaid Other | Admitting: Psychology

## 2018-06-01 NOTE — Progress Notes (Signed)
Vicki Huynh is a 38 y.o. female patient who didn't show for appointment.  Letter sent.        [name], LPC

## 2018-06-11 ENCOUNTER — Other Ambulatory Visit: Payer: Self-pay

## 2018-06-11 ENCOUNTER — Encounter (HOSPITAL_COMMUNITY): Payer: Self-pay | Admitting: Psychiatry

## 2018-06-11 ENCOUNTER — Ambulatory Visit (INDEPENDENT_AMBULATORY_CARE_PROVIDER_SITE_OTHER): Payer: Medicaid Other | Admitting: Psychiatry

## 2018-06-11 VITALS — BP 122/70 | Ht 67.0 in | Wt 189.0 lb

## 2018-06-11 DIAGNOSIS — F419 Anxiety disorder, unspecified: Secondary | ICD-10-CM | POA: Diagnosis not present

## 2018-06-11 DIAGNOSIS — F3160 Bipolar disorder, current episode mixed, unspecified: Secondary | ICD-10-CM | POA: Diagnosis not present

## 2018-06-11 MED ORDER — LURASIDONE HCL 60 MG PO TABS: 60.0000 mg | ORAL_TABLET | Freq: Every day | ORAL | 2 refills | Status: DC

## 2018-06-11 NOTE — Progress Notes (Addendum)
[facility] MD/PA/NP OP Progress Note  06/11/2018 8:41 AM Vicki Huynh  MRN:  [id number]  Chief Complaint: I tried hydroxyzine but it is making him very groggy and in the morning I am very irritable.  HPI: Vicki Huynh came for her appointment.  On her last visit we started low-dose hydroxyzine to help her anxiety but she felt very groggy and irritable in the morning and decided to stop.  She still have highs and lows and some days she is very anxious and depressed.  She wants to go back to try Latuda 60 mg.  We have increase Latuda in the past but at the same time she had a flu symptoms and she thought it was a side effects of the increased Latuda.  Now she to retry Latuda 60 mg.  She has no other concerns.  She takes over-the-counter Benadryl which helps for her sleep and anxiety.  She is active and social.  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  One of her son has autism she is pleased that school is helping his child.  Patient lives with her husband who is very supportive.  Patient denies any mania, psychosis, hallucination.  She is seeing [name] on a regular basis.  She denies drinking or using any illegal substances.  Visit Diagnosis:    ICD-10-CM   1. Anxiety F41.9 Lurasidone HCl 60 MG TABS  2. Bipolar 1 disorder, mixed (HCC) F31.60 Lurasidone HCl 60 MG TABS    Past Psychiatric History: Reviewed. H/O depression, mood swings, irritability, mania and impulsive behavior.  PCP prescribed Effexor in 2012 which worked for a while.  Tried Lamictal (hallucination), Hydroxyzine (groggy) . H/O drinking and cannabis use but sober since 2009.  No h/o inpatient or any suicidal attempt.    Past Medical History:  Past Medical History:  Diagnosis Date  . Anxiety   . Depression   . Diabetic mellitus, gestational     Past Surgical History:  Procedure Laterality Date  . CHOLECYSTECTOMY    . HYSTEROSCOPY WITH NOVASURE N/A 10/09/2014   Procedure: HYSTEROSCOPY WITH  NOVASURE;  Surgeon: Cheri Fowler, MD;  Location: [facility];  Service: Gynecology;  Laterality: N/A;  . LAPAROSCOPIC BILATERAL SALPINGECTOMY Bilateral 10/09/2014   Procedure: LAPAROSCOPIC BILATERAL SALPINGECTOMY with removal of Essure coils;  Surgeon: Cheri Fowler, MD;  Location: [facility];  Service: Gynecology;  Laterality: Bilateral;  1hr OR time  . WISDOM TOOTH EXTRACTION      Family Psychiatric History: Reviewed.  Family History:  Family History  Problem Relation Age of Onset  . Rheum arthritis Mother   . Rheum arthritis Father   . Alcohol abuse Father        in recovery  . Depression Father   . Drug abuse Brother     Social History:  Social History   Socioeconomic History  . Marital status: Married    Spouse name: Not on file  . Number of children: Not on file  . Years of education: Not on file  . Highest education level: Not on file  Occupational History  . Not on file  Social Needs  . Financial resource strain: Not on file  . Food insecurity:    Worry: Not on file    Inability: Not on file  . Transportation needs:    Medical: Not on file    Non-medical: Not on file  Tobacco Use  . Smoking status: Never Smoker  . Smokeless tobacco: Never Used  Substance and Sexual Activity  . Alcohol use: No    Alcohol/week: 0.0 standard drinks  . Drug use: No  . Sexual activity: Yes    Birth control/protection: Surgical  Lifestyle  . Physical activity:    Days per week: Not on file    Minutes per session: Not on file  . Stress: Not on file  Relationships  . Social connections:    Talks on phone: Not on file    Gets together: Not on file    Attends religious service: Not on file    Active member of club or organization: Not on file    Attends meetings of clubs or organizations: Not on file    Relationship status: Not on file  Other Topics Concern  . Not on file  Social History Narrative  . Not on file    Allergies:  Allergies  Allergen Reactions  . Lamictal  [Lamotrigine] Other (See Comments)    Hallucination  . Nickel Rash  . Wool Alcohol [Lanolin Alcohol] Itching    Metabolic Disorder Labs: No results found for: HGBA1C, MPG No results found for: PROLACTIN No results found for: CHOL, TRIG, HDL, CHOLHDL, VLDL, LDLCALC No results found for: TSH  Therapeutic Level Labs: No results found for: LITHIUM No results found for: VALPROATE No components found for:  CBMZ  Current Medications: Current Outpatient Medications  Medication Sig Dispense Refill  . ACCU-CHEK FASTCLIX LANCETS MISC   6  . ACCU-CHEK GUIDE test strip   6  . bismuth subsalicylate (PEPTO BISMOL) 262 MG chewable tablet Chew 524 mg by mouth daily as needed for indigestion.    . Calcium Carbonate Antacid (ANTACID) 1177 MG CHEW Chew 2 tablets by mouth daily as needed (indigestion).    Marland Kitchen losartan (COZAAR) 25 MG tablet Take 25 mg by mouth every evening.   0  . lurasidone (LATUDA) 40 MG TABS tablet Take 1 tablet (40 mg total) by mouth daily with breakfast. 30 tablet 2  . metFORMIN (GLUCOPHAGE-XR) 500 MG 24 hr tablet Take 500 mg by mouth every evening.   0  . rosuvastatin (CRESTOR) 5 MG tablet Take 5 mg by mouth every other day.  0  . hydrOXYzine (ATARAX/VISTARIL) 10 MG tablet Take 1 tablet (10 mg total) by mouth daily as needed for anxiety. (Patient not taking: Reported on 06/11/2018) 30 tablet 0  . omeprazole (PRILOSEC) 20 MG capsule Take 1 capsule (20 mg total) by mouth daily. (Patient not taking: Reported on 03/11/2018) 30 capsule 0   No current facility-administered medications for this visit.      Musculoskeletal: Strength & Muscle Tone: within normal limits Gait & Station: normal Patient leans: N/A  Psychiatric Specialty Exam: ROS  Blood pressure 122/70, height 5\' 7"  (1.702 m), weight 189 lb (85.7 kg).Body mass index is 29.6 kg/m.  General Appearance: Casual  Eye Contact:  Good  Speech:  Clear and Coherent  Volume:  Normal  Mood:  Anxious  Affect:  Congruent   Thought Process:  Goal Directed  Orientation:  Full (Time, Place, and Person)  Thought Content: WDL   Suicidal Thoughts:  No  Homicidal Thoughts:  No  Memory:  Immediate;   Good Recent;   Good Remote;   Good  Judgement:  Good  Insight:  Good  Psychomotor Activity:  Normal  Concentration:  Concentration: Fair and Attention Span: Fair  Recall:  Good  Fund of Knowledge: Good  Language: Good  Akathisia:  No  Handed:  Right  AIMS (if indicated): not  done  Assets:  Communication Skills Desire for Improvement Housing Resilience Social Support Transportation  ADL's:  Intact  Cognition: WNL  Sleep:  Fair   Screenings: PHQ2-9     Nutrition from 01/31/2016 in [facility]  PHQ-2 Total Score  0       Assessment and Plan: Bipolar disorder type I.  Anxiety.  I will discontinue hydroxyzine as patient feels very groggy and next day irritable.  She like to try higher dose of Latuda as she still have residual mood lability.  In the past she had tried 60 mg a day with but recall at the same time she had flu and she could not differentiate if is the side effects of the higher dose of Latuda or having flu symptoms.  She is sleeping better when she takes over-the-counter Benadryl.  She is seeing [name] for therapy.  We will try Latuda 60 mg however reminded that if she had side effects with increased Latuda then she should call us immediately.  Recommended to call us back if she has any question or any concern.  Follow-up in 3 months.   [name], MD 06/11/2018, 8:41 AM

## 2018-06-15 ENCOUNTER — Ambulatory Visit (HOSPITAL_COMMUNITY): Payer: Medicaid Other | Admitting: Psychology

## 2018-06-18 ENCOUNTER — Telehealth (HOSPITAL_COMMUNITY): Payer: Self-pay

## 2018-06-18 DIAGNOSIS — F3160 Bipolar disorder, current episode mixed, unspecified: Secondary | ICD-10-CM

## 2018-06-18 DIAGNOSIS — F419 Anxiety disorder, unspecified: Secondary | ICD-10-CM

## 2018-06-18 NOTE — Telephone Encounter (Signed)
Patient is reporting that the increase in [place] is not working and patient is feeling worse. Patient wants to go back down to 40 mg. She has a few of the 40 mg left, she would like a refill on them. Please review and advise, thank you

## 2018-06-21 MED ORDER — LURASIDONE HCL 40 MG PO TABS: 40.0000 mg | ORAL_TABLET | Freq: Every day | ORAL | 2 refills | Status: DC

## 2018-06-21 NOTE — Telephone Encounter (Signed)
Yes she can go back on Latuda 40 mg.  I will call her 40 mg prescription to her pharmacy.

## 2018-06-22 ENCOUNTER — Ambulatory Visit (HOSPITAL_COMMUNITY): Payer: Medicaid Other | Admitting: Psychology

## 2018-06-23 NOTE — Telephone Encounter (Signed)
I called the patient and left a voicemail letting her know

## 2018-06-28 ENCOUNTER — Ambulatory Visit (HOSPITAL_COMMUNITY): Payer: Medicaid Other | Admitting: Psychology

## 2018-06-29 ENCOUNTER — Ambulatory Visit (HOSPITAL_COMMUNITY): Payer: Medicaid Other | Admitting: Psychology

## 2018-06-30 ENCOUNTER — Ambulatory Visit (INDEPENDENT_AMBULATORY_CARE_PROVIDER_SITE_OTHER): Payer: Medicaid Other | Admitting: Psychology

## 2018-06-30 ENCOUNTER — Other Ambulatory Visit: Payer: Self-pay

## 2018-06-30 DIAGNOSIS — F3160 Bipolar disorder, current episode mixed, unspecified: Secondary | ICD-10-CM

## 2018-06-30 DIAGNOSIS — F419 Anxiety disorder, unspecified: Secondary | ICD-10-CM

## 2018-06-30 NOTE — Progress Notes (Signed)
Virtual Visit via Telephone Note  I connected with Vicki Huynh on 06/30/18 at  9:00 AM EDT by telephone and verified that I am speaking with the correct person using two identifiers.   I discussed the limitations, risks, security and privacy concerns of performing an evaluation and management service by telephone and the availability of in person appointments. I also discussed with the patient that there may be a patient responsible charge related to this service. The patient expressed understanding and agreed to proceed.      I provided 44 minutes of non-face-to-face time during this encounter.   [name], [place]    THERAPIST PROGRESS NOTE  Session Time: 9.03am-9.47am  Participation Level: Active  Behavioral Response: Alertaffect wnl  Type of Therapy: Individual Therapy  Treatment Goals addressed: Diagnosis: bipolar, anxiety and goal   Interventions: CBT and Supportive  Summary: Vicki Huynh is a 38 y.o. female who presents with affect wnl.  Pt reported that she is feeling worried about covid- 19.  Pt discussed impact of social distancing and helping kids w/ online learning.  Pt discussed how she is trying to limit her media coverage as just makes anxious- husband is watching constantly.  Pt reported that she is trying ot keep busy w/ crocheting and other things around that house and taking walks.  Pt discussed some worries about whether kids would go back or not and how routines would be w/ picking up youngest if she can't volunteer.  Pt was able to acknowledge that not in her control and worrying not helping and was able to reframe and focus on other things.  Suicidal/Homicidal: Nowithout intent/plan  Therapist Response: assessed pt current functioning per pt report.  Processed w/pt transitions w/ social distancing and new family routines.  Discussed w/ pt her anxiety and ways to reduced.  Explored w/pt ways of getting outside and some time to self.    Plan: Return  again in 2 weeks via webex.The patient was advised to call back or seek an in-person evaluation if the symptoms worsen or if the condition fails to improve as anticipated.    Diagnosis: Anxiety, Bipolar d/o  [place], [place] 06/30/2018

## 2018-07-22 ENCOUNTER — Ambulatory Visit (INDEPENDENT_AMBULATORY_CARE_PROVIDER_SITE_OTHER): Payer: Medicaid Other | Admitting: Psychology

## 2018-07-22 ENCOUNTER — Other Ambulatory Visit: Payer: Self-pay

## 2018-07-22 DIAGNOSIS — F3160 Bipolar disorder, current episode mixed, unspecified: Secondary | ICD-10-CM

## 2018-07-22 DIAGNOSIS — F419 Anxiety disorder, unspecified: Secondary | ICD-10-CM | POA: Diagnosis not present

## 2018-07-22 NOTE — Progress Notes (Signed)
Virtual Visit via Video Note  I connected with Vicki Huynh on 07/22/18 at  1:30 PM EDT by a video enabled telemedicine application and verified that I am speaking with the correct person using two identifiers.   I discussed the limitations of evaluation and management by telemedicine and the availability of in person appointments. The patient expressed understanding and agreed to proceed.   I provided 36 minutes of non-face-to-face time during this encounter.   [name] [facility]    THERAPIST PROGRESS NOTE  Session Time: 1.30pm-2.06pm  Participation Level: Active  Behavioral Response: Well GroomedAlertAnxious  Type of Therapy: Individual Therapy  Treatment Goals addressed: Diagnosis: Anxiety and Bipolar 1 d/o and goal 1.  Interventions: CBT and Strength-based  Summary: Vicki Huynh is a 38 y.o. female who presents with affect wnl.  pt reports that she has been doing well given circumstances.  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  Pt reported she is missing her interactions w/ her friendships she has developed w/ going to help at the school.  Pt reported that she is staying in touch w/ her bestfriend- talking to at least every other day.  Pt reported that sleep schedule as changed as now son going to bed later- but still getting good sleep still utilizing benadryl.  Pt reported she is having anxiety about her faith, worried that if end times coming will she be punished for past.  Pt was able to identify that in her faith she can ask for forgiveness and she has, and so forgiving. Pt is able to identify how to reframe when this distortion continues to arise and that may seek support from friends husband who is a Theme park manager.  .   Suicidal/Homicidal: Nowithout intent/plan  Therapist Response: Assessed pt current functioning per report.  Processed w/ pt continued transition for her and her family w/ social  distancing.  Reflected to pt positives and positive use of coping skills.  Assisted pt w/ identify her thought as cognitive distortion, externalizing the anxiety and making reframe consistent w/ her belief system.  Encourage pt to seek support of someone who can provider spiritual guidance.   Plan: Return again in 2 weeks, via webex. I discussed the assessment and treatment plan with the patient. The patient was provided an opportunity to ask questions and all were answered. The patient agreed with the plan and demonstrated an understanding of the instructions.   The patient was advised to call back or seek an in-person evaluation if the symptoms worsen or if the condition fails to improve as anticipated.  Diagnosis: Bipolar 1 d/o and GAD    [name] [place] 07/22/2018

## 2018-08-10 ENCOUNTER — Ambulatory Visit (INDEPENDENT_AMBULATORY_CARE_PROVIDER_SITE_OTHER): Payer: Medicaid Other | Admitting: Psychology

## 2018-08-10 ENCOUNTER — Other Ambulatory Visit: Payer: Self-pay

## 2018-08-10 DIAGNOSIS — F3160 Bipolar disorder, current episode mixed, unspecified: Secondary | ICD-10-CM | POA: Diagnosis not present

## 2018-08-10 DIAGNOSIS — F419 Anxiety disorder, unspecified: Secondary | ICD-10-CM

## 2018-08-10 NOTE — Progress Notes (Signed)
Virtual Visit via Video Note  I connected with Vicki Huynh on 08/10/18 at  1:30 PM EDT by a video enabled telemedicine application and verified that I am speaking with the correct person using two identifiers.   I discussed the limitations of evaluation and management by telemedicine and the availability of in person appointments. The patient expressed understanding and agreed to proceed.   I provided 57 minutes of non-face-to-face time during this encounter.   Vicki Huynh [facility]    THERAPIST PROGRESS NOTE  Session Time: 1.30pm-2.27pm  Participation Level: Active  Behavioral Response: Well GroomedAlertaffect wnl  Type of Therapy: Individual Therapy  Treatment Goals addressed: Diagnosis: bipolar, anxity and goal 1.  Interventions: CBT, Supportive and Other: conflict resolution  Summary: Vicki Huynh is a 38 y.o. female who presents with affect wnl.  pt reported that she has bene stressed w/ helping her son w/ his school.  Pt reported that husband was critical of how she was responding and pt discussed how felt insensitive as he doesn't help w/ a lot of the caretaking- just the fun times.  Pt reported that she is feeling anxious and restless at times and trying to use skills to reframe, distress tolerance, supports and distractions.  Pt reports managing ok. pt reports increased appetite and thinks situation w/ less time out of house and likely stress//boredome.  Pt trying to make healthy choices.  Pt reported a conflict w/ mother in law last week- when she said insensitive things and made statement that can't say anything right.  Pt recognized that she was going to not handle well unless walked away so did.  Husband accused of being to sensitive and asked her to apologize- pt said she did to resolve but didn't feel she did anything wrong.  Pt acknowledge hard to express self once upset.  Pt did share positive about enjoying mothers day and getting to babysit her nephew who is 38  months old.  Suicidal/Homicidal: Nowithout intent/plan  Therapist Response: Assessed pt current functioning per pt report. Processed w/pt coping w/ stress of social distancing and increased responsibility w/ sons's schooling.  explored w/ pt ways she is coping and reflected positive strategies using and ways of seeking more balance when needed.  Processed conflict and ways resolved and potential other opportunities to assert.    Plan: Return again in 2 weeks, via webex. I discussed the assessment and treatment plan with the patient. The patient was provided an opportunity to ask questions and all were answered. The patient agreed with the plan and demonstrated an understanding of the instructions.   The patient was advised to call back or seek an in-person evaluation if the symptoms worsen or if the condition fails to improve as anticipated. Diagnosis: Bipolar 1 d/o; anxiety  ,, [facility] 08/10/2018

## 2018-08-24 ENCOUNTER — Ambulatory Visit (INDEPENDENT_AMBULATORY_CARE_PROVIDER_SITE_OTHER): Payer: Medicaid Other | Admitting: Psychology

## 2018-08-24 ENCOUNTER — Other Ambulatory Visit: Payer: Self-pay

## 2018-08-24 DIAGNOSIS — F3160 Bipolar disorder, current episode mixed, unspecified: Secondary | ICD-10-CM | POA: Diagnosis not present

## 2018-08-24 DIAGNOSIS — F419 Anxiety disorder, unspecified: Secondary | ICD-10-CM

## 2018-08-24 NOTE — Progress Notes (Signed)
Virtual Visit via Video Note  I connected with Vicki Huynh on 08/24/18 at  2:30 PM EDT by a video enabled telemedicine application and verified that I am speaking with the correct person using two identifiers.   I discussed the limitations of evaluation and management by telemedicine and the availability of in person appointments. The patient expressed understanding and agreed to proceed.  I provided 57 minutes of non-face-to-face time during this encounter.   Vicki Huynh [facility]    THERAPIST PROGRESS NOTE  Session Time: 2.30pm-3.27pm  Participation Level: Active  Behavioral Response: Well GroomedAlertaffect wnl  Type of Therapy: Individual Therapy  Treatment Goals addressed: Diagnosis: Bipolar 1 d/o and GAD and goal 1.  Interventions: CBT and Supportive  Summary: Vicki Huynh is a 38 y.o. female who presents with affect wnl.  pt reported on stressors of kids school and managing at home.  Pt reported that they had to have their roof fixed and did have some issues because dad disagreed w/ their decisions and haven't talked in a couple of days.  Pt reported that she has been able to assert herself and not get worked up w/ some comments by mother in Sports coach.  Pt reports she does have some anxiety about the new school year and what that will look like and trying to not get too focused on anxiety.  Pt was able to understand ways to keep those boundaries w/ others while they talk about that as well.   Suicidal/Homicidal: Nowithout intent/plan  Therapist Response: Assessed pt current functioning per pt report. Processed w/pt how coping w/ the stressors. Continued to discuss ways she can assert herself and use her coping skills to distract and reframe anxiety.   Plan: Return again in 2 weeks, via webex.  I discussed the assessment and treatment plan with the patient. The patient was provided an opportunity to ask questions and all were answered. The patient agreed with the plan and  demonstrated an understanding of the instructions.   The patient was advised to call back or seek an in-person evaluation if the symptoms worsen or if the condition fails to improve as anticipated.  Diagnosis: Bipolar d/o and Anxiety.   Vicki Huynh [facility] 08/24/2018

## 2018-09-07 ENCOUNTER — Ambulatory Visit (HOSPITAL_COMMUNITY): Payer: Medicaid Other | Admitting: Psychology

## 2018-09-13 ENCOUNTER — Ambulatory Visit (HOSPITAL_COMMUNITY): Payer: Medicaid Other | Admitting: Psychiatry

## 2018-09-13 ENCOUNTER — Other Ambulatory Visit: Payer: Self-pay

## 2018-09-14 ENCOUNTER — Other Ambulatory Visit: Payer: Self-pay

## 2018-09-14 ENCOUNTER — Ambulatory Visit (INDEPENDENT_AMBULATORY_CARE_PROVIDER_SITE_OTHER): Payer: Medicaid Other | Admitting: Psychology

## 2018-09-14 DIAGNOSIS — F3160 Bipolar disorder, current episode mixed, unspecified: Secondary | ICD-10-CM | POA: Diagnosis not present

## 2018-09-14 NOTE — Progress Notes (Signed)
Virtual Visit via Video Note  I connected with Vicki Huynh on 09/14/18 at  3:30 PM EDT by a video enabled telemedicine application and verified that I am speaking with the correct person using two identifiers.   I discussed the limitations of evaluation and management by telemedicine and the availability of in person appointments. The patient expressed understanding and agreed to proceed.    I discussed the assessment and treatment plan with the patient. The patient was provided an opportunity to ask questions and all were answered. The patient agreed with the plan and demonstrated an understanding of the instructions.   The patient was advised to call back or seek an in-person evaluation if the symptoms worsen or if the condition fails to improve as anticipated.  I provided 43 minutes of non-face-to-face time during this encounter.   [name] [facility]    THERAPIST PROGRESS NOTE  Session Time: 3.30pm-4.13pm  Participation Level: Active  Behavioral Response: Well GroomedAlertaffect wnl  Type of Therapy: Individual Therapy  Treatment Goals addressed: Diagnosis: Bipolar 1 d/o and goal 1.  Interventions: CBT and Supportive  Summary: Vicki Huynh is a 38 y.o. female who presents with affect wnl.  pt reported that she has been doing ok.  pt reports that her family has been a little "stir crazy" and kids wanting just to do electronics in the home.  Pt reported that she has doctor check up tomorrow and has some anxiety about but is able to reframe for self.  Pt reported that she is worried about school next year for kids and balancing between meeting their needs and being safe.  Pt reported that she is being mindful of not ruminating on and that need to wait for plans before can make decisions.    Suicidal/Homicidal: Nowithout intent/plan  Therapist Response: Assessed pt current functioning per pt report. Processed w/ pt coping w/ interactions at home and w/ anxiety and  worries.  Discussed w/ pt coping skills and reflected good w/ redirecting self and reframing.   Plan: Return again in 2 weeks, via webex.  Diagnosis: Bipolar 1 d/o, Anxiety   YATES,LEANNE, [facility] 09/14/2018

## 2018-09-16 ENCOUNTER — Ambulatory Visit (INDEPENDENT_AMBULATORY_CARE_PROVIDER_SITE_OTHER): Payer: Medicaid Other | Admitting: Psychiatry

## 2018-09-16 ENCOUNTER — Other Ambulatory Visit: Payer: Self-pay

## 2018-09-16 ENCOUNTER — Encounter (HOSPITAL_COMMUNITY): Payer: Self-pay | Admitting: Psychiatry

## 2018-09-16 DIAGNOSIS — F419 Anxiety disorder, unspecified: Secondary | ICD-10-CM | POA: Diagnosis not present

## 2018-09-16 DIAGNOSIS — F3160 Bipolar disorder, current episode mixed, unspecified: Secondary | ICD-10-CM

## 2018-09-16 MED ORDER — LURASIDONE HCL 40 MG PO TABS: 40.0000 mg | ORAL_TABLET | Freq: Every day | ORAL | 2 refills | Status: DC

## 2018-09-16 NOTE — Progress Notes (Signed)
Virtual Visit via Telephone Note  I connected with Vicki Huynh on 09/16/18 at  2:00 PM EDT by telephone and verified that I am speaking with the correct person using two identifiers.   I discussed the limitations, risks, security and privacy concerns of performing an evaluation and management service by telephone and the availability of in person appointments. I also discussed with the patient that there may be a patient responsible charge related to this service. The patient expressed understanding and agreed to proceed.   History of Present Illness: Patient was evaluated by phone session.  On her last visit we recommend to try Latuda 60 mg but she started to have more anxiety and feeling tired.  She feels a 40 mg is working very well.  She denies any mood swing, highs and lows or any mania.  She reported any side effects including tremors or shakes.  She is seeing [name] for therapy.  Her sleep is better.  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  She is also concerned about her husband who is currently not working because of pandemic but hoping to resume his job soon.  She lives with her husband who is supportive.  She denies any hallucination or any paranoia.  She like to continue Latuda 40 mg daily which is helping her mood and anxiety.  She admitted weight gain due to COVID-19 but now she is more focusing on healthy lifestyle.  She is compliant with Crestor, metformin.  She is checking her blood sugar which she describes stable.  She denies drinking or using any illegal substances.  Past Psychiatric History: Reviewed. H/O depression, mood swings, irritability, mania and impulsive behavior.  PCP prescribed Effexor in 2012 which worked for a while.  Tried Lamictal (hallucination), Hydroxyzine (groggy) . H/O drinking and cannabis use but sober since 2009.  No h/o inpatient or any suicidal attempt.      Psychiatric Specialty Exam: Physical Exam   ROS  There were no vitals taken for this visit.There is no height or weight on file to calculate BMI.  General Appearance: NA  Eye Contact:  NA  Speech:  Clear and Coherent  Volume:  Normal  Mood:  Anxious  Affect:  NA  Thought Process:  Goal Directed  Orientation:  Full (Time, Place, and Person)  Thought Content:  Logical  Suicidal Thoughts:  No  Homicidal Thoughts:  No  Memory:  Immediate;   Good Recent;   Good Remote;   Good  Judgement:  Good  Insight:  Good  Psychomotor Activity:  NA  Concentration:  Concentration: Good and Attention Span: Good  Recall:  Good  Fund of Knowledge:  Good  Language:  Good  Akathisia:  No  Handed:  Right  AIMS (if indicated):     Assets:  Communication Skills Desire for Improvement Housing Resilience Social Support  ADL's:  Intact  Cognition:  WNL  Sleep:         Assessment and Plan: Bipolar disorder type I.  Anxiety.  I will continue Latuda 40 mg as 60 mg did not work for her.  Encouraged to continue therapy with [name].  Encourage healthy lifestyle and watch her calorie intake.  Recommended to call us back if she has any question or any concern.  Follow-up in 3 months.  Follow Up Instructions:    I discussed the assessment and treatment plan with the patient. The patient was provided an opportunity to  ask questions and all were answered. The patient agreed with the plan and demonstrated an understanding of the instructions.   The patient was advised to call back or seek an in-person evaluation if the symptoms worsen or if the condition fails to improve as anticipated.  I provided 15 minutes of non-face-to-face time during this encounter.   [name], MD

## 2018-09-28 ENCOUNTER — Ambulatory Visit (INDEPENDENT_AMBULATORY_CARE_PROVIDER_SITE_OTHER): Payer: Medicaid Other | Admitting: Psychology

## 2018-09-28 ENCOUNTER — Other Ambulatory Visit: Payer: Self-pay

## 2018-09-28 DIAGNOSIS — F419 Anxiety disorder, unspecified: Secondary | ICD-10-CM

## 2018-09-28 DIAGNOSIS — F3160 Bipolar disorder, current episode mixed, unspecified: Secondary | ICD-10-CM

## 2018-09-28 NOTE — Progress Notes (Signed)
Virtual Visit via Video Note  I connected with Vicki Huynh on 09/28/18 at  2:30 PM EDT by a video enabled telemedicine application and verified that I am speaking with the correct person using two identifiers.   I discussed the limitations of evaluation and management by telemedicine and the availability of in person appointments. The patient expressed understanding and agreed to proceed.      I discussed the assessment and treatment plan with the patient. The patient was provided an opportunity to ask questions and all were answered. The patient agreed with the plan and demonstrated an understanding of the instructions.   The patient was advised to call back or seek an in-person evaluation if the symptoms worsen or if the condition fails to improve as anticipated.  I provided 57 minutes of non-face-to-face time during this encounter.   [name] [facility]    THERAPIST PROGRESS NOTE  Session Time: 2.30pm-3.27pm  Participation Level: Active  Behavioral Response: Well GroomedAlertaffect wnl  Type of Therapy: Individual Therapy  Treatment Goals addressed: Diagnosis: Bipolar, anxiety and goal 1.  Interventions: CBT  Summary: Vicki Huynh is a 38 y.o. female who presents with affect wnl.  pt reported that she has been doing ok.  pt reported that she f/u w/her PCP for virtual visit and had labs the day following.  She has seen her lab results and as far as she can tell looks ok- still waiting for PCP to f/u about.  Pt reported that she had a dream last night and brother was in it- wasn't bad or good.  Pt recognized when woke that she would like to attempt to connect w/him again- has been 2 years.  Pt reported that she isnt looking for a close relationship or any expectations but that aware that wants to reach out- not regret.  Pt reported that husband isn't supporting but that she felt important to do- has unblocked on facebook and sent friend request.  Pt discussed her anxiety  about school in the fall and what the decision will be and whether she will feel comfortable sending back. Pt had a lot of what ifs and was able to recognize needing to get info before plans.     Suicidal/Homicidal: Nowithout intent/plan  Therapist Response: Assessed pt current functioning per pt report. Processed w/pt decision of attempt to connect to brother again and how to be able to do and keep good boundaries. Explored w/pt anxiety and ways of focusing on what she can control and not planning for things she doesn't even know yet.   Plan: Return again in 2 weeks, via webex. Diagnosis: Bipolar 1 d/o and anxiety.   [name] [facility] 09/28/2018

## 2018-10-13 ENCOUNTER — Other Ambulatory Visit: Payer: Self-pay

## 2018-10-13 ENCOUNTER — Ambulatory Visit (INDEPENDENT_AMBULATORY_CARE_PROVIDER_SITE_OTHER): Payer: Medicaid Other | Admitting: Psychology

## 2018-10-13 DIAGNOSIS — F3160 Bipolar disorder, current episode mixed, unspecified: Secondary | ICD-10-CM

## 2018-10-13 NOTE — Progress Notes (Signed)
Virtual Visit via Video Note  I connected with Vicki Huynh on 10/13/18 at  2:30 PM EDT by a video enabled telemedicine application and verified that I am speaking with the correct person using two identifiers.   I discussed the limitations of evaluation and management by telemedicine and the availability of in person appointments. The patient expressed understanding and agreed to proceed.    I discussed the assessment and treatment plan with the patient. The patient was provided an opportunity to ask questions and all were answered. The patient agreed with the plan and demonstrated an understanding of the instructions.   The patient was advised to call back or seek an in-person evaluation if the symptoms worsen or if the condition fails to improve as anticipated.  I provided 47 minutes of non-face-to-face time during this encounter.   [name] [facility]    THERAPIST PROGRESS NOTE  Session Time: 2.30pm-3.17pm  Participation Level: Active  Behavioral Response: Well GroomedAlertAnxious  Type of Therapy: Individual Therapy  Treatment Goals addressed: Diagnosis: Bipolar 1 d/o and goal 1.  Interventions: CBT and Strength-based  Summary: Vicki Huynh is a 38 y.o. female who presents with affect wnl.  pt reported that she went to doctor for ankle pain that wouldn't go away and the NP told her it was neuropathy, which really worried pt.  Pt reported she has reworked her diet and then spoke w/ her PCP's nurse as was still concerned about the dx. She learned it wasn't a definite dx and would have to be confirmed w/ other testing.  Pt reported past couple days has been better.  Pt reported that her mom came for a socially distanced birthday visit and pt was emotional as felt awkward w/ mom 9 feet away and no physical contact w/ the kids.  Pt was able to reframe that was a visit and mom's comfortable level w/ family is different than hers given pandemic.  Pt discussed that she has done  well to not get involved in community discussions about whether to send kids back to school or not.  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  Suicidal/Homicidal: Nowithout intent/plan  Therapist Response: Assessed pt current functioning per pt report. Processed w/pt coping w/ stressors that have been present and assisted pt in recognizing and reframing thoughts.  Reflected to pt that given careful consideration and positive focus on wellness.  Plan: Return again in 2 weeks, via webex.  Diagnosis: Bipolar 1 d/o   [name] [facility] 10/13/2018

## 2018-10-27 ENCOUNTER — Other Ambulatory Visit: Payer: Self-pay

## 2018-10-27 ENCOUNTER — Ambulatory Visit (INDEPENDENT_AMBULATORY_CARE_PROVIDER_SITE_OTHER): Payer: Medicaid Other | Admitting: Psychology

## 2018-10-27 DIAGNOSIS — F3160 Bipolar disorder, current episode mixed, unspecified: Secondary | ICD-10-CM | POA: Diagnosis not present

## 2018-10-27 NOTE — Progress Notes (Signed)
Virtual Visit via Video Note  I connected with Vicki Huynh on 10/27/18 at  2:30 PM EDT by a video enabled telemedicine application and verified that I am speaking with the correct person using two identifiers.   I discussed the limitations of evaluation and management by telemedicine and the availability of in person appointments. The patient expressed understanding and agreed to proceed.    I discussed the assessment and treatment plan with the patient. The patient was provided an opportunity to ask questions and all were answered. The patient agreed with the plan and demonstrated an understanding of the instructions.   The patient was advised to call back or seek an in-person evaluation if the symptoms worsen or if the condition fails to improve as anticipated.  I provided 57 minutes of non-face-to-face time during this encounter.   [name] [facility]    THERAPIST PROGRESS NOTE  Session Time: 2.30pm-3.27pm  Participation Level: Active  Behavioral Response: Well GroomedAlertAnxious  Type of Therapy: Individual Therapy  Treatment Goals addressed: Diagnosis: Bipolar 1 d/o and goal 1.  Interventions: CBT and Supportive  Summary: Vicki Huynh is a 38 y.o. female who presents with affect wnl.  pt reported that she did f/u w/ her PCP about her foot and doctor informed not neuropathy- but tendon inflammation and ways to treat.  Pt reported this was a relief and has been feeling better w/ use of Aleve and foot supports in shoes. Pt reported that she also has been doing better w/her sugars and diet w/ less carbs.  Pt feels good about how she is handling.  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  Pt discussed some anxiety about choosing a direction and not working out and feeling stuck.  Pt discussed that she feels she is managing this anxiety and being mindful of not taking too much information in and ruminating on.  Pt  reported she has had texting and talking w mom and decided to express to mom instead of social distance visit maybe video chat would be better.    Suicidal/Homicidal: Nowithout intent/plan  Therapist Response: Assessed pt current functioning per pt report. Processed w/pt coping w/ anxiety and decisions facing.  Reflected pt good use of skills and decision making.   Plan: Return again in 3 weeks, via webex.  Diagnosis: Bipolar 1 d/o    [name] [facility] 10/27/2018

## 2018-11-16 ENCOUNTER — Ambulatory Visit (HOSPITAL_COMMUNITY): Payer: Medicaid Other | Admitting: Psychology

## 2018-12-07 ENCOUNTER — Ambulatory Visit (INDEPENDENT_AMBULATORY_CARE_PROVIDER_SITE_OTHER): Payer: Medicaid Other | Admitting: Psychology

## 2018-12-07 ENCOUNTER — Other Ambulatory Visit: Payer: Self-pay

## 2018-12-07 DIAGNOSIS — F3160 Bipolar disorder, current episode mixed, unspecified: Secondary | ICD-10-CM | POA: Diagnosis not present

## 2018-12-07 DIAGNOSIS — F419 Anxiety disorder, unspecified: Secondary | ICD-10-CM

## 2018-12-07 NOTE — Progress Notes (Signed)
Virtual Visit via Video Note  I connected with Vicki Huynh on 12/07/18 at  3:30 PM EDT by a video enabled telemedicine application and verified that I am speaking with the correct person using two identifiers.   I discussed the limitations of evaluation and management by telemedicine and the availability of in person appointments. The patient expressed understanding and agreed to proceed.    I discussed the assessment and treatment plan with the patient. The patient was provided an opportunity to ask questions and all were answered. The patient agreed with the plan and demonstrated an understanding of the instructions.   The patient was advised to call back or seek an in-person evaluation if the symptoms worsen or if the condition fails to improve as anticipated.  I provided 62 minutes of non-face-to-face time during this encounter.   [name] [facility]    THERAPIST PROGRESS NOTE  Session Time: 3.30pm-4.32pm  Participation Level: Active  Behavioral Response: Well GroomedAlert, affect wnl  Type of Therapy: Individual Therapy  Treatment Goals addressed: Diagnosis: Bipolar 1 d/o and goal 1.  Interventions: CBT and Strength-based  Summary: Vicki Huynh is a 38 y.o. female who presents with affect wnl. Pt reported that w/ school starting back she has been more stressed.  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  Pt reported that she has felt more anxiety and some panic attacks- mostly at night and some worry about going to sleep and the struggles the next morning waking up and waking sons up.  Pt also reported she has acid reflux that became severe again and visited PCP and on medication for a month.  Pt is able to identify ways of reframing and not ruminating on worry and stressors.  .   Suicidal/Homicidal: Nowithout intent/plan  Therapist Response: Assessed pt  current functioning per pt report. Processed w/pt coping w/ stressor of distance learning and assisting her son through.  Explored w/pt anxiety that has increased and worries ruminating on.  Assisted pt in challenging and reframing- focus on self soothing.  Plan: Return again in 2 weeks, via webex.  Pt to f/u as scheduled w/ Dr. Adele Schilder.  Diagnosis: Bipolar 1 d/o [name] [facility] 12/07/2018

## 2018-12-16 ENCOUNTER — Encounter (HOSPITAL_COMMUNITY): Payer: Self-pay | Admitting: Psychiatry

## 2018-12-16 ENCOUNTER — Ambulatory Visit (INDEPENDENT_AMBULATORY_CARE_PROVIDER_SITE_OTHER): Payer: Medicaid Other | Admitting: Psychiatry

## 2018-12-16 ENCOUNTER — Other Ambulatory Visit: Payer: Self-pay

## 2018-12-16 DIAGNOSIS — F419 Anxiety disorder, unspecified: Secondary | ICD-10-CM | POA: Diagnosis not present

## 2018-12-16 DIAGNOSIS — F3160 Bipolar disorder, current episode mixed, unspecified: Secondary | ICD-10-CM

## 2018-12-16 MED ORDER — LURASIDONE HCL 40 MG PO TABS: 40.0000 mg | ORAL_TABLET | Freq: Every day | ORAL | 2 refills | Status: DC

## 2018-12-16 NOTE — Progress Notes (Signed)
Virtual Visit via Telephone Note  I connected with Vicki Huynh on 12/16/18 at 10:20 AM EDT by telephone and verified that I am speaking with the correct person using two identifiers.   I discussed the limitations, risks, security and privacy concerns of performing an evaluation and management service by telephone and the availability of in person appointments. I also discussed with the patient that there may be a patient responsible charge related to this service. The patient expressed understanding and agreed to proceed.   History of Present Illness: Patient was evaluated by phone session.  She is taking Latuda 40 mg which she feels working very well.  She is sleeping better.  She denies any anxiety attacks.  She describes her mood is stable and she is tolerating 40 mg Latuda much better.  She had tried 60 mg but that make her more anxious and tired.  She denies any highs and lows, irritability, mania, psychosis or any crying spells.  She denies any depression but admitted weight gain in recent months.  She admitted not doing the exercise and recently started to have symptoms of acid reflux and given proton pump inhibitor by PA.  Her husband is still not able to go back to work.  Patient told her husband is having colitis and she is busy taking care of him and getting to the doctor.  Patient is seeing [name] on a regular basis.  She is checking her blood sugar on a regular basis.  She is compliant with her Metformin.  Patient denies any tremors shakes or any EPS.  She denies any suicidal thoughts.  She denies any drinking or using any illegal substances.  She wants to continue current dose of Latuda which is helping her anxiety and bipolar disorder.   Past Psychiatric History:Reviewed. H/Odepression, mood swings, irritability, maniaand impulsive behavior. PCP prescribed Effexor in 2012 which worked for a while. Tried Lamictal (hallucination), Hydroxyzine (groggy).H/Odrinking and  cannabis use but sober since 2009. No h/oinpatient or any suicidal attempt.    Psychiatric Specialty Exam: Physical Exam  ROS  There were no vitals taken for this visit.There is no height or weight on file to calculate BMI.  General Appearance: NA  Eye Contact:  NA  Speech:  Clear and Coherent and Normal Rate  Volume:  Normal  Mood:  Euthymic  Affect:  NA  Thought Process:  Coherent and Goal Directed  Orientation:  Full (Time, Place, and Person)  Thought Content:  WDL  Suicidal Thoughts:  No  Homicidal Thoughts:  No  Memory:  Immediate;   Good Recent;   Good Remote;   Good  Judgement:  Good  Insight:  Good  Psychomotor Activity:  NA  Concentration:  Concentration: Good and Attention Span: Good  Recall:  Good  Fund of Knowledge:  Good  Language:  Good  Akathisia:  No  Handed:  Right  AIMS (if indicated):     Assets:  Communication Skills Desire for Improvement Housing Resilience  ADL's:  Intact  Cognition:  WNL  Sleep:   good      Assessment and Plan: Bipolar disorder type I.  Anxiety.  Discussed healthy lifestyle and watch her calorie intake and do regular exercise.  She admitted gaining weight due to not doing exercise.  Encourage to walk at least 10 to 15 minutes 2-3 times a week.  Encourage to continue therapy with [name].  I will continue Latuda 40 mg daily.  Recommended to call us back if she has  any question or any concern.  Follow-up in 3 months.  Follow Up Instructions:    I discussed the assessment and treatment plan with the patient. The patient was provided an opportunity to ask questions and all were answered. The patient agreed with the plan and demonstrated an understanding of the instructions.   The patient was advised to call back or seek an in-person evaluation if the symptoms worsen or if the condition fails to improve as anticipated.  I provided 20 minutes of non-face-to-face time during this encounter.   Vicki Nations, MD

## 2018-12-27 ENCOUNTER — Other Ambulatory Visit: Payer: Self-pay

## 2018-12-27 ENCOUNTER — Ambulatory Visit (HOSPITAL_COMMUNITY): Payer: Medicaid Other | Admitting: Psychology

## 2019-01-06 ENCOUNTER — Encounter: Payer: Medicaid Other | Attending: Family Medicine | Admitting: Registered"

## 2019-01-06 ENCOUNTER — Other Ambulatory Visit: Payer: Self-pay

## 2019-01-06 DIAGNOSIS — E119 Type 2 diabetes mellitus without complications: Secondary | ICD-10-CM

## 2019-01-06 NOTE — Progress Notes (Signed)
Diabetes Self-Management Education  Visit Type: First/Initial  Appt. Start Time: 1030 Appt. End Time: [id number]  01/07/2019  [name], identified by name and date of birth, is a 38 y.o. female with a diagnosis of Diabetes: Type 2.   ASSESSMENT  There were no vitals taken for this visit. There is no height or weight on file to calculate BMI.   Patient states she came to [facility] for DM education in 2018 and feels she needs a refresher. Pt states she feels her blood sugar is probably going up. Per referral lab it was 5.9% ~3 months ago. Pt states she has gained 15 lbs since March. RD agrees with patient that inactivity as well as stress and increased soda intake have likely increased her A1c.  Pt states she recently has increased her physical activity and trying to watch what she eats.  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 Pt is concerns that school will be resuming in-person classes before it is safe and wants to extend rremote learning but has not received information about that yet.   SMBG: pt reports 121 mg/dL at random checks; 130 this morning fasting higher than usually FBG is 120-ish.  Pt states she belongs to a facebook group, [employer], and plans to use that resource more often for support and ideas.  Diabetes Self-Management Education - 01/06/19 1029      Visit Information   Visit Type  First/Initial      Initial Visit   Diabetes Type  Type 2    Are you currently following a meal plan?  No    Are you taking your medications as prescribed?  Yes   metformin   Date Diagnosed  2012      Health Coping   How would you rate your overall health?  Good      Psychosocial Assessment   Patient Belief/Attitude about Diabetes  Afraid    How often do you need to have someone help you when you read instructions, pamphlets, or other written materials from your doctor or pharmacy?  1 - Never    What is the last grade level  you completed in school?  graduated      Complications   Last HgB A1C per patient/outside source  5.9 %   per referral lab 09/22/18   How often do you check your blood sugar?  3-4 times / week    Fasting Blood glucose range (mg/dL)  70-129   usually 120-ish, today was 130 unusual   Have you had a dilated eye exam in the past 12 months?  No    Have you had a dental exam in the past 12 months?  No    Are you checking your feet?  No      Dietary Intake   Breakfast  none    Lunch  (11:30) mini pizza OR grilled hot dogs    Snack (afternoon)  fruit snacks OR apple    Dinner  (4:30) grilled chicken breast, steamed veggies OR chicken nuggets & french fries    Snack (evening)  (8 pm) [place] yogurt, fruit OR lunchable with drink    Beverage(s)  water, 12 oz Dr. Malachi Huynh      Exercise   Exercise Type  Light (walking / raking leaves)    How many days per week to you exercise?  7    How many minutes per day do you exercise?  [place]  Total minutes per week of exercise  140      Patient Education   Previous Diabetes Education  Yes (please comment)   2010   Nutrition management   Role of diet in the treatment of diabetes and the relationship between the three main macronutrients and blood glucose level    Physical activity and exercise   Role of exercise on diabetes management, blood pressure control and cardiac health.    Monitoring  Identified appropriate SMBG and/or A1C goals.      Individualized Goals (developed by patient)   Nutrition  General guidelines for healthy choices and portions discussed    Monitoring   test my blood glucose as discussed      Outcomes   Expected Outcomes  Demonstrated interest in learning. Expect positive outcomes    Future DMSE  PRN    Program Status  Completed       Individualized Plan for Diabetes Self-Management Training:   Learning Objective:  Patient will have a greater understanding of diabetes self-management. Patient education plan is to attend  individual and/or group sessions per assessed needs and concerns.   Patient Instructions  Consider increase water intake earlier in the day, a glass of water before you start the on-line school with son. Consider cutting back on Dr. Malachi Huynh, drinking more water earlier in the day may help reduce cravings for it. Put humus and carrots back into your diet. Can use the snack sheet for balanced snack ideas Consider asking your doctor what your A1c or blood sugar goals are. For general guidelines fasting 80-130 and 2 hours post meal less than 180.   Expected Outcomes:  Demonstrated interest in learning. Expect positive outcomes  Education material provided: A1C conversion sheet, My Plate and Snack sheet  If problems or questions, patient to contact team via:  Phone and MyChart  Future DSME appointment: PRN

## 2019-01-06 NOTE — Patient Instructions (Addendum)
Consider increase water intake earlier in the day, a glass of water before you start the on-line school with son. Consider cutting back on Dr. Malachi Bonds, drinking more water earlier in the day may help reduce cravings for it. Put humus and carrots back into your diet. Can use the snack sheet for balanced snack ideas Consider asking your doctor what your A1c or blood sugar goals are. For general guidelines fasting 80-130 and 2 hours post meal less than 180.

## 2019-01-10 ENCOUNTER — Other Ambulatory Visit: Payer: Self-pay

## 2019-01-10 ENCOUNTER — Ambulatory Visit (INDEPENDENT_AMBULATORY_CARE_PROVIDER_SITE_OTHER): Payer: Medicaid Other | Admitting: Psychology

## 2019-01-10 DIAGNOSIS — F3160 Bipolar disorder, current episode mixed, unspecified: Secondary | ICD-10-CM | POA: Diagnosis not present

## 2019-01-10 NOTE — Progress Notes (Signed)
  Virtual Visit via Video Note  I connected with Vicki Huynh on 01/10/19 at  1:30 PM EDT by a video enabled telemedicine application and verified that I am speaking with the correct person using two identifiers.   I discussed the limitations of evaluation and management by telemedicine and the availability of in person appointments. The patient expressed understanding and agreed to proceed.    I discussed the assessment and treatment plan with the patient. The patient was provided an opportunity to ask questions and all were answered. The patient agreed with the plan and demonstrated an understanding of the instructions.   The patient was advised to call back or seek an in-person evaluation if the symptoms worsen or if the condition fails to improve as anticipated.  I provided 57 minutes of non-face-to-face time during this encounter.   [name] [facility]   THERAPIST PROGRESS NOTE  Session Time: 1.30pm-2.27pm  Participation Level: Active  Behavioral Response: Well GroomedAlertaffect wnl  Type of Therapy: Individual Therapy  Treatment Goals addressed: Diagnosis: Bipolar 1 d/o and goal 1.  Interventions: CBT and Supportive  Summary: Vicki Huynh is a 38 y.o. female who presents with affect wnl.  Pt reported that she is doing better w/her heartburn.  Pt reported that feels good to not wake w/ nausea.  Pt reported she isn't see a clear diet trigger.  Pt reported that she is walking everyother day for 20 minutes.  Pt reported that she still feels anxious about school and decision for kids instruction.  Pt reported that she her children's school is her biggest stressors w/ her son's autism and helping to keep him engaged.  Pt also reported stills struggle w/ waking him in the morning.  Pt did discuss that not focusing on the unknown or what ifs and feels good that even if school board chooses to go in person and not option of staying w/ school for remote that they will home school.   Pt is focusing on managing her stress w/ this.  Pt did state she feels alone at times w/ not wanting to send back because one mom friend she has is ready for her kids to return.  Pt was able to acknowledge that she has talked w/ others who feel that same as her and choosing to stay remote and not return.     Suicidal/Homicidal: Nowithout intent/plan  Therapist Response: Assessed pt current functioning per pt report.  Processed w/pt coping w/ stressors and anxiety of kids schooling and upcoming decisions if school system decids to return to [employer].  Assisted in validating and normalizing while identify distortion of alone.  Assisted pt in reframe.  Plan: Return again in 2 weeks, via webex.  Continue as scheduled w/ Dr. Adele Schilder.  Diagnosis: Bipolar 1 d/o   [name] [facility] 01/10/2019

## 2019-01-24 ENCOUNTER — Ambulatory Visit (HOSPITAL_COMMUNITY): Payer: Medicaid Other | Admitting: Licensed Clinical Social Worker

## 2019-01-24 ENCOUNTER — Ambulatory Visit (INDEPENDENT_AMBULATORY_CARE_PROVIDER_SITE_OTHER): Payer: Medicaid Other | Admitting: Psychology

## 2019-01-24 ENCOUNTER — Other Ambulatory Visit: Payer: Self-pay

## 2019-01-24 DIAGNOSIS — F3160 Bipolar disorder, current episode mixed, unspecified: Secondary | ICD-10-CM | POA: Diagnosis not present

## 2019-01-24 NOTE — Progress Notes (Signed)
Virtual Visit via Video Note  I connected with Vicki Huynh on 01/24/19 at  2:30 PM EDT by a video enabled telemedicine application and verified that I am speaking with the correct person using two identifiers.   I discussed the limitations of evaluation and management by telemedicine and the availability of in person appointments. The patient expressed understanding and agreed to proceed.    I discussed the assessment and treatment plan with the patient. The patient was provided an opportunity to ask questions and all were answered. The patient agreed with the plan and demonstrated an understanding of the instructions.   The patient was advised to call back or seek an in-person evaluation if the symptoms worsen or if the condition fails to improve as anticipated.  I provided 55 minutes of non-face-to-face time during this encounter.   [name] [facility]    THERAPIST PROGRESS NOTE  Session Time: 2.30pm-3.25pm  Participation Level: Active  Behavioral Response: Well GroomedAlertaffect wnl  Type of Therapy: Individual Therapy  Treatment Goals addressed: Diagnosis: bipolar 1 d/o and goal 1.  Interventions: CBT and Supportive  Summary: Vicki Huynh is a 38 y.o. female who presents with affect wnl.  Pt reported that she has been doing ok w/ her mood.  Pt reported that at times anxious and stressed w/ school and remote learning for son.  Pt reported that feels good that younger son's school is accomodating w/ remote learning and home middle schoolf or older son will do the same.  Pt reported that she had visit w/ her mother at the park and that didn't go very well as her younger son couldn't handle the long walk and began complaining. Also pt felt that mom and stepdad left out younger son when didn't bring something for him to use like did for older child.  Pt doesn't feel that there is any point in expressing as will be dismissed by mom.  Pt prefers to just plan at the next visit.   Pt was able able to challenge some of her beliefs that mom doesn't want her at the home as was upset last time they visited and so masking as social distancing.  Pt did acknowledge that perhaps she isn't comfortable w/ indoor visit.  Pt reported that her husband did get frustrated when had to watch the kids 3 days in a row and lashed out that doesn't understand "why needs to talk to a stranger".  Pt was able to increase awareness that likely reacting to his frustration w/ the kids that day.  Pt discussed how she feels she does benefit from counseling.  Pt was able to identify that at times to hard on self when she gives her son or self a break from getting up from live session.  Pt was able to recognize the effort she is putting in and be kind to self .   Suicidal/Homicidal: Nowithout intent/plan  Therapist Response: assessed pt current functioning per pt report.  Processed w/pt interactions w/ mom- assisted pt w/ challenge distortions and exploring ways of communicating and planning for visits w/ mom.  Discussed w/ pt progress w/ tx and benefits she is seeing.  Encouraged pt to assert her thoughts and feelings.  Discussed ways of not being to hard on self w/ decisions and during times of increased stressors assisting w/ son's remote learning.  Plan: Return again in 2 weeks, via webex.  F/u as scheduled w/ Dr. Adele Schilder.  Diagnosis: Bipolar 1 d/o     YATES,LEANNE, [facility]  01/24/2019  

## 2019-02-07 ENCOUNTER — Ambulatory Visit (HOSPITAL_COMMUNITY): Payer: Medicaid Other | Admitting: Psychology

## 2019-02-07 ENCOUNTER — Other Ambulatory Visit: Payer: Self-pay

## 2019-02-07 ENCOUNTER — Ambulatory Visit (HOSPITAL_COMMUNITY): Payer: Medicaid Other | Admitting: Licensed Clinical Social Worker

## 2019-02-07 ENCOUNTER — Encounter (HOSPITAL_COMMUNITY): Payer: Self-pay | Admitting: Psychology

## 2019-02-07 NOTE — Progress Notes (Signed)
Vicki Huynh is a 38 y.o. female patient who contacted this morning declining webex virtual appointment invitation for afternoon appointment.  Counselor confirmed canceling appointment.  Pt informed hadn't felt well w/ stiff neck for day- we are scheduled for f/u in 2 weeks via webex.        [name], [facility]

## 2019-02-21 ENCOUNTER — Ambulatory Visit (INDEPENDENT_AMBULATORY_CARE_PROVIDER_SITE_OTHER): Payer: Medicaid Other | Admitting: Psychology

## 2019-02-21 ENCOUNTER — Other Ambulatory Visit: Payer: Self-pay

## 2019-02-21 DIAGNOSIS — F3160 Bipolar disorder, current episode mixed, unspecified: Secondary | ICD-10-CM | POA: Diagnosis not present

## 2019-02-21 DIAGNOSIS — F419 Anxiety disorder, unspecified: Secondary | ICD-10-CM

## 2019-02-21 NOTE — Progress Notes (Signed)
Virtual Visit via Video Note  I connected with Vicki Huynh on 02/21/19 at  1:30 PM EST by a video enabled telemedicine application and verified that I am speaking with the correct person using two identifiers.   I discussed the limitations of evaluation and management by telemedicine and the availability of in person appointments. The patient expressed understanding and agreed to proceed.    I discussed the assessment and treatment plan with the patient. The patient was provided an opportunity to ask questions and all were answered. The patient agreed with the plan and demonstrated an understanding of the instructions.   The patient was advised to call back or seek an in-person evaluation if the symptoms worsen or if the condition fails to improve as anticipated.  I provided 54 minutes of non-face-to-face time during this encounter.   [name] [facility]    THERAPIST PROGRESS NOTE  Session Time: 1.31pm-2.25pm  Participation Level: Active  Behavioral Response: Well GroomedAlertaffect wnl  Type of Therapy: Individual Therapy  Treatment Goals addressed: Diagnosis: Bipolar 1 d/o and goal 1.  Interventions: CBT and Supportive  Summary: Vicki Huynh is a 38 y.o. female who presents with affect wnl.  Pt reported on stressors of remote learning and changes that they are following to align w/ schedule of children attending inperson.  pt reported that it has been a stressors w/ son's sleep issues and autism to wake him in the morning and get going for live session and one week only made on one time.  Pt reported that the following week she was able to make toall live sessions except one day.  Pt discussed how the shift next week to be expecting him attending remote live sessions for 6 hours is going to be difficult. Pt also reported that over the past couple of weeks she has also been assisting her father w/ wound that requires daily dressings.  Pt reported that she was able to assert  w/ dad needs for children's appointments to not be changed to meet his preferences.  Pt reported that planning to have thanksgiving w/ her father and feels that still can express needs/ wants w/ him.  Pt was able to reframe distortions w/ counselor assistance.  Suicidal/Homicidal: Nowithout intent/plan  Therapist Response: Assessed pt current functioning per pt report.  Processed w/pt stressors and how she is coping.  Dicussed pt use of assertiveness and ways of expressing to teachers or family members.  assisted in reframing distortions.   Plan: Return again in 2 weeks, via webex. Pt to f/u as scheduled w/ Dr. Adele Schilder.  Diagnosis:  Bipolar 1 d/o    [name] [facility] 02/21/2019

## 2019-03-09 ENCOUNTER — Ambulatory Visit (HOSPITAL_COMMUNITY): Payer: Medicaid Other | Admitting: Psychology

## 2019-03-17 ENCOUNTER — Other Ambulatory Visit: Payer: Self-pay

## 2019-03-17 ENCOUNTER — Ambulatory Visit (INDEPENDENT_AMBULATORY_CARE_PROVIDER_SITE_OTHER): Payer: Medicaid Other | Admitting: Psychiatry

## 2019-03-17 ENCOUNTER — Encounter (HOSPITAL_COMMUNITY): Payer: Self-pay | Admitting: Psychiatry

## 2019-03-17 DIAGNOSIS — F3160 Bipolar disorder, current episode mixed, unspecified: Secondary | ICD-10-CM | POA: Diagnosis not present

## 2019-03-17 DIAGNOSIS — F419 Anxiety disorder, unspecified: Secondary | ICD-10-CM

## 2019-03-17 MED ORDER — LURASIDONE HCL 40 MG PO TABS: 40.0000 mg | ORAL_TABLET | Freq: Every day | ORAL | 2 refills | Status: DC

## 2019-03-17 NOTE — Progress Notes (Signed)
Virtual Visit via Telephone Note  I connected with Vicki Huynh on 03/17/19 at  1:00 PM EST by telephone and verified that I am speaking with the correct person using two identifiers.   I discussed the limitations, risks, security and privacy concerns of performing an evaluation and management service by telephone and the availability of in person appointments. I also discussed with the patient that there may be a patient responsible charge related to this service. The patient expressed understanding and agreed to proceed.   History of Present Illness: Patient was evaluated by phone session.  She is doing well on Latuda 60 mg.  In the past we have tried 60 but it make her more anxious and nervous.  She is adjusting very well to 40 mg and she denies any highs and lows mood swing or any irritability.  Her anxiety is also under control but sometimes she gets nervous due to Covid.  Last week she had flulike symptoms and she had test for flu and Covid and she was relieved it was negative.  Her husband is still not able to go back to work because of colitis.  Patient lives with her husband and 8 and 1 year old who are doing virtual schooling.  Patient had a smaller Thanksgiving with her father who lives close by.  Patient denies any irritability, anger, mania or any psychosis.  She is in compliant with therapy with Forest Gleason.  She reported her blood sugar is also controlled and today she had a blood pressure which was normal.  She reported no tremors, shakes or any EPS.  She like to keep the current dose of medication.   Past Psychiatric History:Reviewed. H/Odepression, mood swings, irritability, maniaand impulsive behavior. PCP prescribed Effexor in 2012 which worked for a while. Tried Lamictal (hallucination), Hydroxyzine (groggy).H/Odrinking and cannabis use but sober since 2009. No h/oinpatient or any suicidal attempt.   Psychiatric Specialty Exam: Physical Exam  Review of Systems   There were no vitals taken for this visit.There is no height or weight on file to calculate BMI.  General Appearance: NA  Eye Contact:  NA  Speech:  Clear and Coherent and Normal Rate  Volume:  Normal  Mood:  Euthymic  Affect:  NA  Thought Process:  Goal Directed  Orientation:  Full (Time, Place, and Person)  Thought Content:  Logical  Suicidal Thoughts:  No  Homicidal Thoughts:  No  Memory:  Immediate;   Good Recent;   Good Remote;   Good  Judgement:  Intact  Insight:  Good  Psychomotor Activity:  NA  Concentration:  Concentration: Good and Attention Span: Good  Recall:  Good  Fund of Knowledge:  Good  Language:  Good  Akathisia:  No  Handed:  Right  AIMS (if indicated):     Assets:  Communication Skills Desire for Improvement Housing Resilience Social Support  ADL's:  Intact  Cognition:  WNL  Sleep:   good      Assessment and Plan: Bipolar disorder type I.  Anxiety.  Patient is a stable on Latuda 40 mg.  Discussed medication side effects and benefits and encouraged to continue therapy with Forest Gleason.  Recommended to call us back if she has any question of any concern.  Follow-up in 3 months.  Follow Up Instructions:    I discussed the assessment and treatment plan with the patient. The patient was provided an opportunity to ask questions and all were answered. The patient agreed with the plan and demonstrated an  understanding of the instructions.   The patient was advised to call back or seek an in-person evaluation if the symptoms worsen or if the condition fails to improve as anticipated.  I provided 15 minutes of non-face-to-face time during this encounter.   Vicki Nations, MD

## 2019-03-24 ENCOUNTER — Telehealth (HOSPITAL_COMMUNITY): Payer: Self-pay

## 2019-03-24 NOTE — Telephone Encounter (Signed)
Medication management - Telephone call with [facility], representative with [facility] [employer] to complete pt's prior authorization for her prescribed Latuda.  Medication approved with [id number] through until 03/18/2020.  Called pt's [facility] to inform pt's Latuda medication was approved and could now be filled as ordered.

## 2019-03-29 ENCOUNTER — Other Ambulatory Visit: Payer: Self-pay

## 2019-03-29 ENCOUNTER — Ambulatory Visit (INDEPENDENT_AMBULATORY_CARE_PROVIDER_SITE_OTHER): Payer: Medicaid Other | Admitting: Psychology

## 2019-03-29 DIAGNOSIS — F3160 Bipolar disorder, current episode mixed, unspecified: Secondary | ICD-10-CM

## 2019-03-29 NOTE — Progress Notes (Signed)
Virtual Visit via Video Note  I connected with Vicki Huynh on 03/29/19 at 12:30 PM EST by a video enabled telemedicine application and verified that I am speaking with the correct person using two identifiers.   I discussed the limitations of evaluation and management by telemedicine and the availability of in person appointments. The patient expressed understanding and agreed to proceed.    I discussed the assessment and treatment plan with the patient. The patient was provided an opportunity to ask questions and all were answered. The patient agreed with the plan and demonstrated an understanding of the instructions.   The patient was advised to call back or seek an in-person evaluation if the symptoms worsen or if the condition fails to improve as anticipated.  I provided 55 minutes of non-face-to-face time during this encounter.   [name] [facility]    THERAPIST PROGRESS NOTE  Session Time: 12.30pm-1.25pm  Participation Level: Active  Behavioral Response: Well GroomedAlertAnxious  Type of Therapy: Individual Therapy  Treatment Goals addressed: Diagnosis: bipolar 1 d/o and anxiety.  goal 1.  Interventions: CBT and Strength-based  Summary: Vicki Huynh is a 38 y.o. female who presents with affect wnl.  pt reported that she had a good Christmas w/ her family.  Pt expressed disappointment that mom didn't feel comfortable finding way to visit beyond dropping off gifts and talking for 5 minutes in driveway.  Pt was able to acknowledge that not personal and mom worries about covid and precautions taking.  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  Pt reported she feels some stress and anxiety about this- providing the right curriculum and facilitating learning.  Pt was able to validate and normalize anxiety and stress and discuss ways she is approaching and will be a learning curve.     Suicidal/Homicidal: Nowithout  intent/plan  Therapist Response: Assessed pt current functioning per pt report. explored w/pt interactions with family  over holidays. Processed w/pt stress and anxiety w/ decision to homeschool.  Reflected to pt resources and supports seeking out and ways approaching coping w/.  Plan: Return again in 2 weeks, via webex.  F/u as scheduled w/ dr. Adele Schilder.  Diagnosis: Bipolar 1 d/o    [name] [facility] 03/29/2019

## 2019-04-12 ENCOUNTER — Other Ambulatory Visit: Payer: Self-pay

## 2019-04-12 ENCOUNTER — Ambulatory Visit (INDEPENDENT_AMBULATORY_CARE_PROVIDER_SITE_OTHER): Payer: Medicaid Other | Admitting: Psychology

## 2019-04-12 DIAGNOSIS — F3160 Bipolar disorder, current episode mixed, unspecified: Secondary | ICD-10-CM

## 2019-04-12 NOTE — Progress Notes (Signed)
Virtual Visit via Video Note  I connected with Vicki Huynh on 04/12/19 at  2:30 PM EST by a video enabled telemedicine application and verified that I am speaking with the correct person using two identifiers.   I discussed the limitations of evaluation and management by telemedicine and the availability of in person appointments. The patient expressed understanding and agreed to proceed.    I discussed the assessment and treatment plan with the patient. The patient was provided an opportunity to ask questions and all were answered. The patient agreed with the plan and demonstrated an understanding of the instructions.   The patient was advised to call back or seek an in-person evaluation if the symptoms worsen or if the condition fails to improve as anticipated.  I provided 56 minutes of non-face-to-face time during this encounter.   [name] [facility]    THERAPIST PROGRESS NOTE  Session Time: 2.30pm-3.26pm  Participation Level: Active  Behavioral Response: Well GroomedAlertaffect wnl  Type of Therapy: Individual Therapy  Treatment Goals addressed: Diagnosis: bipolar d/o and anxiety, goal 1.  Interventions: CBT and Supportive  Summary: Vicki Huynh is a 39 y.o. female who presents with affect wnl.  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  Pt reported not getting up as early as would like, but slowly working in that directions.  Pt reported is creating routine and figure out things through resources.  Pt reports that feels less stressed w/ this vs. Remote learning and feels that same for her child.  Pt reported that hasn't had conversations w/ mom- feels anxious about calling, that will be awkward or run out of things to talk about.  Pt also dicussed how this is impacted by not having space from kids to have conversation w/out being interrupted etc. Pt reported that she is searching out connections for homeschooling groups families to build  relationships and support through.   Suicidal/Homicidal: Nowithout intent/plan  Therapist Response: Assessed pt current functioning per pt report.  Processed w/pt coping w/ transition to home schooling.  Encouraged pt to explored resources and supports in homeschooling community.  Discussed ways of connecting w/ mom that feels comfortable but not avoiding and reframing avoid because may be awkward to ways to engage and problem solving barriers..   Plan: Return again in 2 weeks, via webex.   Diagnosis: Bipolar 1 d/o and Anxiety  [name], [facility] 04/12/2019

## 2019-04-26 ENCOUNTER — Other Ambulatory Visit: Payer: Self-pay

## 2019-04-26 ENCOUNTER — Ambulatory Visit (INDEPENDENT_AMBULATORY_CARE_PROVIDER_SITE_OTHER): Payer: Medicaid Other | Admitting: Psychology

## 2019-04-26 DIAGNOSIS — F3160 Bipolar disorder, current episode mixed, unspecified: Secondary | ICD-10-CM

## 2019-04-26 DIAGNOSIS — F419 Anxiety disorder, unspecified: Secondary | ICD-10-CM

## 2019-04-27 NOTE — Progress Notes (Signed)
Virtual Visit via Video Note  I connected with Vicki Huynh on 04/27/19 at  2:30 PM EST by a video enabled telemedicine application and verified that I am speaking with the correct person using two identifiers.   I discussed the limitations of evaluation and management by telemedicine and the availability of in person appointments. The patient expressed understanding and agreed to proceed.    I discussed the assessment and treatment plan with the patient. The patient was provided an opportunity to ask questions and all were answered. The patient agreed with the plan and demonstrated an understanding of the instructions.   The patient was advised to call back or seek an in-person evaluation if the symptoms worsen or if the condition fails to improve as anticipated.  I provided 59 minutes of non-face-to-face time during this encounter.   Vicki Huynh, [facility]    THERAPIST PROGRESS NOTE  Session Time: 2.30pm- 3.29pm  Participation Level: Active  Behavioral Response: Well GroomedAlertaffect wnl  Type of Therapy: Individual Therapy  Treatment Goals addressed: Diagnosis: Bipolar, anxiety and goal 1.  Interventions: CBT and Strength-based  Summary: Vicki Huynh is a 39 y.o. female who presents with affect wnl.  pt reported that home schooling is going ok.  Pt reported that she had some stressful interactions w/ her family over past week.  Pt reported that she decided to reach out to brother and facilitate further communication.  Pt reported that initially couple positive texts back and forth and then brother informing that pt should be spending more time w/ parents and that interactions for grandparents and grandchildren important.  Pt was caught off guard as she feels that she has been seeking interactions w/ mother- but that mother has been putting barriers.  Pt decided to call mother- mom confirmed that she had informed brother that she doesn't put effort into visiting.  Pt reported  that she tired to express ways that seeking and barriers she feels there are and that mom turned around to make her fault.  Pt was able to reflect on how this has been a pattern and that since meeting her husband mom has distance from her and has only sought interactions her way.  Pt discussed how since not dependent on mom relationship has changed.     Suicidal/Homicidal: Nowithout intent/plan  Therapist Response: Assessed pt current functioning per pt report.  Processed w/pt coping w/ recent stressful interactions w/her brother and mom.  Assisted pt in recognizing patterns in interactions, dynamics in family and exploring how she wants to engage w/her family.    Plan: Return again in 2 weeks, via webex.  F/u as scheduled w/ dr. Adele Huynh..  Diagnosis: Bipolar 1 d/o and anxiety  Vicki Huynh, [facility] 04/27/2019

## 2019-05-10 ENCOUNTER — Other Ambulatory Visit: Payer: Self-pay

## 2019-05-10 ENCOUNTER — Ambulatory Visit (INDEPENDENT_AMBULATORY_CARE_PROVIDER_SITE_OTHER): Payer: Medicaid Other | Admitting: Psychology

## 2019-05-10 DIAGNOSIS — F419 Anxiety disorder, unspecified: Secondary | ICD-10-CM

## 2019-05-10 DIAGNOSIS — F3160 Bipolar disorder, current episode mixed, unspecified: Secondary | ICD-10-CM | POA: Diagnosis not present

## 2019-05-10 NOTE — Progress Notes (Signed)
Virtual Visit via Video Note  I connected with Vicki Huynh on 05/10/19 at  1:30 PM EST by a video enabled telemedicine application and verified that I am speaking with the correct person using two identifiers.   I discussed the limitations of evaluation and management by telemedicine and the availability of in person appointments. The patient expressed understanding and agreed to proceed.    I discussed the assessment and treatment plan with the patient. The patient was provided an opportunity to ask questions and all were answered. The patient agreed with the plan and demonstrated an understanding of the instructions.   The patient was advised to call back or seek an in-person evaluation if the symptoms worsen or if the condition fails to improve as anticipated.  I provided 57 minutes of non-face-to-face time during this encounter.   [name] [facility]    THERAPIST PROGRESS NOTE  Session Time: 1.30pm-2.27pm  Participation Level: Active  Behavioral Response: Well GroomedAlertanxious  Type of Therapy: Individual Therapy  Treatment Goals addressed: Diagnosis: bipolar 1 d/o and anxiety.  Interventions: CBT and Strength-based  Summary: Vicki Huynh is a 39 y.o. female who presents with affect wnl.  pt reported that had some recent stressors.  Pt reported that now routine is getting up at 6am to let dog out and then going back to sleep for a little.  Husband was frustrated that they had been sleeping in longer and not starting school earlier. Pt reported trying to find that balance to get sleep, meet needs of kids and resolving differences w/ husband.  Pt reported that she didn't sleep well last night as was ruminating on other stressor about letter received re: steps to maintain medicaid.  In past wasn't problem that not pursuing her first son's bio father for child support.  pt reported that son's bio father listed as unknown on birth certificate as he had kicked out just weeks  prior to giving birth. Only seen when he was a newborn as pt arranged and she talked to him one time when pt around 30months updating.  Pt reports no conact since, no attempts he has made for contact and she doesn't want to open that door.  Pt reported she feels stuck between losing her medicaid and potential of opening up door to further stressors if state goes after for child support. Pt doesn't even know where he lives or his situation currently. Pt is able to focus on what is in her control and that is what she is doing- pt is seeking legal counselor to explore options.    Suicidal/Homicidal: Nowithout intent/plan  Therapist Response: Assessed pt current functioning per pt report.  Processed w/pt recent stressors and assisted pt in identifying ways she wants to respond to stressors and related anxiety w/ things she can do w/ in her control.    Plan: Return again in 2 weeks,via webex.  Pt f/u w/ Dr. Adele Schilder as scheduled.  Pt to f/u as scheduled w/ PCP re: her diabetes.   Diagnosis:   Bipolar 1 d/o.   [name] [facility] 05/10/2019

## 2019-05-24 ENCOUNTER — Ambulatory Visit (HOSPITAL_COMMUNITY): Payer: Medicaid Other | Admitting: Psychology

## 2019-06-15 ENCOUNTER — Encounter (HOSPITAL_COMMUNITY): Payer: Self-pay | Admitting: Psychiatry

## 2019-06-15 ENCOUNTER — Other Ambulatory Visit: Payer: Self-pay

## 2019-06-15 ENCOUNTER — Ambulatory Visit (INDEPENDENT_AMBULATORY_CARE_PROVIDER_SITE_OTHER): Payer: Medicaid Other | Admitting: Psychiatry

## 2019-06-15 DIAGNOSIS — F419 Anxiety disorder, unspecified: Secondary | ICD-10-CM

## 2019-06-15 DIAGNOSIS — F3160 Bipolar disorder, current episode mixed, unspecified: Secondary | ICD-10-CM

## 2019-06-15 MED ORDER — LURASIDONE HCL 40 MG PO TABS: 40.0000 mg | ORAL_TABLET | Freq: Every day | ORAL | 2 refills | Status: DC

## 2019-06-15 NOTE — Progress Notes (Signed)
Virtual Visit via Telephone Note  I connected with Vicki Huynh on 06/15/19 at  1:00 PM EDT by telephone and verified that I am speaking with the correct person using two identifiers.   I discussed the limitations, risks, security and privacy concerns of performing an evaluation and management service by telephone and the availability of in person appointments. I also discussed with the patient that there may be a patient responsible charge related to this service. The patient expressed understanding and agreed to proceed.   History of Present Illness: Patient was evaluated by phone session.  She is taking Latuda 40 mg which is working very well for her.  She has no irritability, highs and lows, mania or any psychosis.  She is sleeping better.  She feels her mood is even with current medication dose.  She denies any crying spells or any feeling of hopelessness.  Energy level is good.  She reported her blood sugar is much better and recently her physician change from Metformin to glimepiride.  She is seeing Dr[facility] Kathyrn Lass [facility] for [facility] needs.  She reported her weight is unchanged from the past.  Her husband is still not able to go back to work and now they are thinking to file for disability.  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  She denies drinking or using any illegal substances.  She does not want to change medication.   Past Psychiatric History:Reviewed. H/Odepression, mood swings, irritability, maniaand impulsive behavior. PCP prescribed Effexor in 2012 which worked for a while. Tried Lamictal (hallucination), Hydroxyzine (groggy).H/Odrinking and cannabis use but sober since 2009. No h/oinpatient or any suicidal attempt.   Psychiatric Specialty Exam: Physical Exam  Review of Systems  There were no vitals taken for this visit.There is no height or weight on file to calculate BMI.  General Appearance: NA  Eye  Contact:  NA  Speech:  Clear and Coherent and Normal Rate  Volume:  Normal  Mood:  Euthymic  Affect:  NA  Thought Process:  Descriptions of Associations: Intact  Orientation:  Full (Time, Place, and Person)  Thought Content:  WDL and Logical  Suicidal Thoughts:  No  Homicidal Thoughts:  No  Memory:  Immediate;   Good Recent;   Good Remote;   Good  Judgement:  Good  Insight:  Present  Psychomotor Activity:  NA  Concentration:  Concentration: Good and Attention Span: Good  Recall:  Good  Fund of Knowledge:  Good  Language:  Good  Akathisia:  No  Handed:  Right  AIMS (if indicated):     Assets:  Communication Skills Desire for Improvement Housing Resilience Social Support  ADL's:  Intact  Cognition:  WNL  Sleep:   ok       Assessment and Plan: Bipolar disorder type I.  Anxiety.  Patient is doing well on Latuda 40 mg daily.  Encouraged to continue therapy with Leanne.  Discussed medication side effects and benefits.  She has no tremors, shakes or any EPS.  Recommended to call us back if she has any question of any concern.  Follow-up in 3 months.  Follow Up Instructions:    I discussed the assessment and treatment plan with the patient. The patient was provided an opportunity to ask questions and all were answered. The patient agreed with the plan and demonstrated an understanding of the instructions.   The patient was advised to call back or seek an in-person evaluation  if the symptoms worsen or if the condition fails to improve as anticipated.  I provided 20 minutes of non-face-to-face time during this encounter.   [name], MD

## 2019-06-16 ENCOUNTER — Other Ambulatory Visit: Payer: Self-pay

## 2019-06-16 ENCOUNTER — Ambulatory Visit (INDEPENDENT_AMBULATORY_CARE_PROVIDER_SITE_OTHER): Payer: Medicaid Other | Admitting: Psychology

## 2019-06-16 DIAGNOSIS — F3160 Bipolar disorder, current episode mixed, unspecified: Secondary | ICD-10-CM | POA: Diagnosis not present

## 2019-06-16 DIAGNOSIS — F419 Anxiety disorder, unspecified: Secondary | ICD-10-CM

## 2019-06-16 NOTE — Progress Notes (Signed)
Virtual Visit via Video Note  I connected with Vicki Huynh on 06/16/19 at 12:30 PM EDT by a video enabled telemedicine application and verified that I am speaking with the correct person using two identifiers.   I discussed the limitations of evaluation and management by telemedicine and the availability of in person appointments. The patient expressed understanding and agreed to proceed.    I discussed the assessment and treatment plan with the patient. The patient was provided an opportunity to ask questions and all were answered. The patient agreed with the plan and demonstrated an understanding of the instructions.   The patient was advised to call back or seek an in-person evaluation if the symptoms worsen or if the condition fails to improve as anticipated.  I provided 50 minutes of non-face-to-face time during this encounter.   Vicki Huynh [facility]    THERAPIST PROGRESS NOTE  Session Time: 12.35pm-1.25pm  Participation Level: Active  Behavioral Response: Well GroomedAlertaffect wnl  Type of Therapy: Individual Therapy  Treatment Goals addressed: Diagnosis: bipolar d/o and anxiety and goal 1.  Interventions: CBT and Supportive  Summary: Vicki Huynh is a 39 y.o. female who presents with affect bright.  Pt had emailed that likely running late as taking longer than planned at son's doctor's appointment.  Pt reported that she was able to get renewal of medicaid solved- still completed forms to seek child support for first w/ request that her address be removed from forms sent.  Pt reports that feels more at ease w/ this.  Pt reported that sleep hasn't been good as disrupted to care for older dog that wakes throughout the night.  Pt aware of impact and coping through.  Pt Pt reported she isn't engaging w/ mom- will send a text occasional as hello, but mom not further initiating and pt doesn't want to spend energy on this.    Suicidal/Homicidal: Nowithout  intent/plan  Therapist Response: Assessed pt current functioning per pt report. Processed w/pt coping w/ lack restorative sleep.  Discussed pt ability to work through problem solve stressors.  Discussed healthy boundaries in her relationships w/ family.   Plan: Return again in 2 weeks, via webex.  F/u as scheduled w/ Dr. Adele Schilder.  Diagnosis: Bipolar 1 d/o and Anxiety   Vicki Huynh, [facility] 06/16/2019

## 2019-07-06 ENCOUNTER — Emergency Department (HOSPITAL_COMMUNITY)
Admission: EM | Admit: 2019-07-06 | Discharge: 2019-07-06 | Disposition: A | Discharge status: Home / Self Care | Payer: Medicaid Other | Attending: Emergency Medicine | Admitting: Emergency Medicine

## 2019-07-06 ENCOUNTER — Emergency Department (HOSPITAL_COMMUNITY): Payer: Medicaid Other

## 2019-07-06 ENCOUNTER — Other Ambulatory Visit: Payer: Self-pay

## 2019-07-06 ENCOUNTER — Encounter (HOSPITAL_COMMUNITY): Payer: Self-pay | Admitting: Emergency Medicine

## 2019-07-06 DIAGNOSIS — Z79899 Other long term (current) drug therapy: Secondary | ICD-10-CM | POA: Diagnosis not present

## 2019-07-06 DIAGNOSIS — R1031 Right lower quadrant pain: Secondary | ICD-10-CM

## 2019-07-06 DIAGNOSIS — Z7984 Long term (current) use of oral hypoglycemic drugs: Secondary | ICD-10-CM | POA: Insufficient documentation

## 2019-07-06 LAB — CBC
HCT: 40.6 % (ref 36.0–46.0)
Hemoglobin: 14.1 g/dL (ref 12.0–15.0)
MCH: 30.5 pg (ref 26.0–34.0)
MCHC: 34.7 g/dL (ref 30.0–36.0)
MCV: 87.9 fL (ref 80.0–100.0)
Platelets: 317 K/uL (ref 150–400)
RBC: 4.62 MIL/uL (ref 3.87–5.11)
RDW: 12 % (ref 11.5–15.5)
WBC: 10 K/uL (ref 4.0–10.5)
nRBC: 0 % (ref 0.0–0.2)

## 2019-07-06 LAB — COMPREHENSIVE METABOLIC PANEL
ALT: 34 U/L (ref 0–44)
AST: 25 U/L (ref 15–41)
Albumin: 4.3 g/dL (ref 3.5–5.0)
Alkaline Phosphatase: 58 U/L (ref 38–126)
Anion gap: 8 (ref 5–15)
BUN: 14 mg/dL (ref 6–20)
CO2: 24 mmol/L (ref 22–32)
Calcium: 9.1 mg/dL (ref 8.9–10.3)
Chloride: 111 mmol/L (ref 98–111)
Creatinine, Ser: 0.75 mg/dL (ref 0.44–1.00)
GFR calc Af Amer: >60 mL/min (ref >60)
GFR calc non Af Amer: >60 mL/min (ref >60)
Glucose, Bld: 158 mg/dL — ABNORMAL HIGH (ref 70–99)
Potassium: 3.9 mmol/L (ref 3.5–5.1)
Sodium: 143 mmol/L (ref 135–145)
Total Bilirubin: 0.5 mg/dL (ref 0.3–1.2)
Total Protein: 7.6 g/dL (ref 6.5–8.1)

## 2019-07-06 LAB — URINALYSIS, ROUTINE W REFLEX MICROSCOPIC
Bilirubin Urine: NEGATIVE
Glucose, UA: NEGATIVE mg/dL
Hgb urine dipstick: NEGATIVE
Ketones, ur: NEGATIVE mg/dL
Leukocytes,Ua: NEGATIVE
Nitrite: POSITIVE — AB
Protein, ur: 30 mg/dL — AB
Specific Gravity, Urine: 1.026 (ref 1.005–1.030)
pH: 5 (ref 5.0–8.0)

## 2019-07-06 LAB — I-STAT BETA HCG BLOOD, ED (MC, WL, AP ONLY): I-stat hCG, quantitative: <5 m[IU]/mL

## 2019-07-06 LAB — LIPASE, BLOOD: Lipase: 29 U/L (ref 11–51)

## 2019-07-06 MED ORDER — IOHEXOL 300 MG/ML  SOLN
100.0000 mL | Freq: Once | INTRAMUSCULAR | Status: AC | PRN
  Administered 2019-07-06: 100 mL via INTRAVENOUS

## 2019-07-06 MED ORDER — SODIUM CHLORIDE (PF) 0.9 % IJ SOLN
INTRAMUSCULAR | Status: AC
  Filled 2019-07-06: qty 50

## 2019-07-06 MED ORDER — SODIUM CHLORIDE 0.9% FLUSH: 3.0000 mL | Freq: Once | INTRAVENOUS | Status: DC

## 2019-07-06 NOTE — ED Provider Notes (Signed)
[facility] DEPT Provider Note   CSN: [id number] Arrival date & time: 07/06/19  1947     History Chief Complaint  Patient presents with  . Abdominal Pain    Lavender L Sheff is a 39 y.o. female.  39 year old female with prior medical history as detailed below presents for evaluation of right lower quadrant abdominal pain.  Patient reports onset of right lower quadrant abdominal pain over the last 48 hours.  She reports that she went to her regular care provider who performed a pelvic exam earlier today.  Patient is reporting that this plus a urine obtained in the office did not demonstrate the cause for her pain.  She was sent to the ED to evaluate for possible appendicitis.  She denies associated fever.  She denies hematuria, dysuria, vaginal discharge, bowel movement change, or other complaint.  The history is provided by the patient and medical records.  Abdominal Pain Pain location:  RLQ Pain quality: aching and cramping   Pain radiates to:  Does not radiate Pain severity:  Mild Onset quality:  Gradual Duration:  2 days Timing:  Constant Progression:  Waxing and waning Chronicity:  New Relieved by:  Nothing Worsened by:  Nothing      Past Medical History:  Diagnosis Date  . Anxiety   . Depression   . Diabetic mellitus, gestational     Patient Active Problem List   Diagnosis Date Noted  . Menorrhagia with regular cycle 10/09/2014  . Dysmenorrhea 10/09/2014  . Pelvic pain in female 10/09/2014  . 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 Pain in joint, upper arm 02/06/2014  . Paresthesias 02/06/2014    Past Surgical History:  Procedure Laterality Date  . CHOLECYSTECTOMY    . HYSTEROSCOPY WITH NOVASURE N/A 10/09/2014   Procedure: HYSTEROSCOPY WITH NOVASURE;  Surgeon: Cheri Fowler, MD;  Location: [facility];  Service: Gynecology;  Laterality: N/A;  . LAPAROSCOPIC BILATERAL SALPINGECTOMY Bilateral 10/09/2014   Procedure: LAPAROSCOPIC BILATERAL  SALPINGECTOMY with removal of Essure coils;  Surgeon: Cheri Fowler, MD;  Location: [facility];  Service: Gynecology;  Laterality: Bilateral;  1hr OR time  . WISDOM TOOTH EXTRACTION       OB History    Gravida  4   Para  2   Term  2   Preterm      AB  2   Living  2     SAB  1   TAB  1   Ectopic      Multiple      Live Births              Family History  Problem Relation Age of Onset  . Rheum arthritis Mother   . Rheum arthritis Father   . Alcohol abuse Father        in recovery  . Depression Father   . Drug abuse Brother     Social History   Tobacco Use  . Smoking status: Never Smoker  . Smokeless tobacco: Never Used  Substance Use Topics  . Alcohol use: No    Alcohol/week: 0.0 standard drinks  . Drug use: No    Home Medications Prior to Admission medications   Medication Sig Start Date End Date Taking? Authorizing Provider  bismuth subsalicylate (PEPTO BISMOL) 262 MG chewable tablet Chew 524 mg by mouth daily as needed for indigestion.   Yes [provider]  diphenhydrAMINE (BENADRYL) 25 mg capsule Take 50 mg by mouth at bedtime as needed for sleep.  Yes [provider]  glimepiride (AMARYL) 4 MG tablet Take 4 mg by mouth daily with breakfast.   Yes Kathyrn Lass, MD  losartan (COZAAR) 25 MG tablet Take 25 mg by mouth every evening.  12/16/17  Yes [provider]  lurasidone (LATUDA) 40 MG TABS tablet Take 1 tablet (40 mg total) by mouth daily with breakfast. 06/15/19  Yes Arfeen, Arlyce Harman, MD  pantoprazole (PROTONIX) 20 MG tablet Take 20 mg by mouth daily.  11/30/18  Yes [provider]  rosuvastatin (CRESTOR) 5 MG tablet Take 5 mg by mouth every other day. 12/28/17  Yes [provider]  ACCU-CHEK FASTCLIX LANCETS [place]  06/20/17   [provider]  ACCU-CHEK GUIDE test strip  06/20/17   [provider]    Allergies    Lamictal [lamotrigine], Nickel, and Wool alcohol [lanolin alcohol]  Review of  Systems   Review of Systems  Gastrointestinal: Positive for abdominal pain.  All other systems reviewed and are negative.   Physical Exam Updated Vital Signs BP (!) 122/91   Pulse 91   Temp 98.7 F (37.1 C) (Oral)   Resp 17   Ht 5\' 7"  (1.702 m)   Wt 101.2 kg   LMP 06/22/2019   SpO2 99%   BMI 34.93 kg/m   Physical Exam Vitals and nursing note reviewed.  Constitutional:      General: She is not in acute distress.    Appearance: She is well-developed.  HENT:     Head: Normocephalic and atraumatic.  Eyes:     Conjunctiva/sclera: Conjunctivae normal.     Pupils: Pupils are equal, round, and reactive to light.  Cardiovascular:     Rate and Rhythm: Normal rate and regular rhythm.     Heart sounds: Normal heart sounds.  Pulmonary:     Effort: Pulmonary effort is normal. No respiratory distress.     Breath sounds: Normal breath sounds.  Abdominal:     General: There is no distension.     Palpations: Abdomen is soft.     Tenderness: There is abdominal tenderness in the right lower quadrant.  Genitourinary:    Comments: Patient declines repeat Pelvic exam   Musculoskeletal:        General: No deformity. Normal range of motion.     Cervical back: Normal range of motion and neck supple.  Skin:    General: Skin is warm and dry.  Neurological:     Mental Status: She is alert and oriented to person, place, and time.     ED Results / Procedures / Treatments   Labs (all labs ordered are listed, but only abnormal results are displayed) Labs Reviewed  COMPREHENSIVE METABOLIC PANEL - Abnormal; Notable for the following components:      Result Value   Glucose, Bld 158 (*)    All other components within normal limits  URINALYSIS, ROUTINE W REFLEX MICROSCOPIC - Abnormal; Notable for the following components:   Color, Urine AMBER (*)    Protein, ur 30 (*)    Nitrite POSITIVE (*)    Bacteria, UA RARE (*)    All other components within normal limits  LIPASE, BLOOD  CBC    I-STAT BETA HCG BLOOD, ED ([facility], [facility], [facility] ONLY)    EKG None  Radiology CT ABDOMEN PELVIS W CONTRAST  Result Date: 07/06/2019 CLINICAL DATA:  Right lower quadrant pain. EXAM: CT ABDOMEN AND PELVIS WITH CONTRAST TECHNIQUE: Multidetector CT imaging of the abdomen and pelvis was performed using the  standard protocol following bolus administration of intravenous contrast. CONTRAST:  118mL OMNIPAQUE IOHEXOL 300 MG/ML  SOLN COMPARISON:  CT dated February 02, 2018 FINDINGS: Lower chest: The lung bases are clear. The heart is enlarged. Hepatobiliary: There is decreased hepatic attenuation suggestive of hepatic steatosis. Status post cholecystectomy.There is no biliary ductal dilation. Pancreas: Normal contours without ductal dilatation. No peripancreatic fluid collection. Spleen: Unremarkable. Adrenals/Urinary Tract: --Adrenal glands: Unremarkable. --Right kidney/ureter: No hydronephrosis or radiopaque kidney stones. --Left kidney/ureter: There is a stable small exophytic cyst arising from the lower pole left kidney. There is a probable nonobstructing stone in the lower pole. --Urinary bladder: Unremarkable. Stomach/Bowel: --Stomach/Duodenum: No hiatal hernia or other gastric abnormality. Normal duodenal course and caliber. --Small bowel: Unremarkable. --Colon: Unremarkable. --Appendix: Normal. Vascular/Lymphatic: Normal course and caliber of the major abdominal vessels. --No retroperitoneal lymphadenopathy. --No mesenteric lymphadenopathy. --No pelvic or inguinal lymphadenopathy. Reproductive: Again noted is a metallic foreign body in the region of the right uterine fundus. There is a small cystic area within the uterine fundus, similar to prior study back in 2019. This may related to prior surgical intervention. Other: No ascites or free air. The abdominal wall is normal. Musculoskeletal. No acute displaced fractures. IMPRESSION: 1. No acute abdominopelvic abnormality. Specifically, the appendix is normal. 2. Hepatic  steatosis. Electronically Signed   By: Constance Holster M.D.   On: 07/06/2019 22:31    Procedures Procedures (including critical care time)  Medications Ordered in ED Medications  sodium chloride (PF) 0.9 % injection (has no administration in time range)  iohexol (OMNIPAQUE) 300 MG/ML solution 100 mL (100 mLs Intravenous Contrast Given 07/06/19 2202)    ED Course  I have reviewed the triage vital signs and the nursing notes.  Pertinent labs & imaging results that were available during my care of the patient were reviewed by me and considered in my medical decision making (see chart for details).    MDM Rules/Calculators/A&P                      MDM  Screen complete  Jazzlynn L Busser was evaluated in Emergency Department on 07/06/2019 for the symptoms described in the history of present illness. She was evaluated in the context of the global COVID-19 pandemic, which necessitated consideration that the patient might be at risk for infection with the SARS-CoV-2 virus that causes COVID-19. Institutional protocols and algorithms that pertain to the evaluation of patients at risk for COVID-19 are in a state of rapid change based on information released by regulatory bodies including the CDC and federal and state organizations. These policies and algorithms were followed during the patient's care in the ED.  Patient is presenting for evaluation of reported right lower quadrant abdominal pain.  Work-up in the ED is without evidence of significant acute pathology.  Patient does feel improved following her ED evaluation and now desires discharge.  Reports close follow-up is stressed.  Strict return precautions given understood.  Final Clinical Impression(s) / ED Diagnoses Final diagnoses:  Right lower quadrant abdominal pain    Rx / DC Orders ED Discharge Orders    None       Valarie Merino, MD 07/06/19 2340

## 2019-07-06 NOTE — Discharge Instructions (Addendum)
Please return for any problem.  Follow-up with your regular care provider as instructed. °

## 2019-07-06 NOTE — ED Triage Notes (Signed)
Patient is complaining of right lower abdominal pain. Patient states that has been hurting for three days. Her dr sent her to ER to see if she has an appendicitis.

## 2019-07-18 ENCOUNTER — Ambulatory Visit (INDEPENDENT_AMBULATORY_CARE_PROVIDER_SITE_OTHER): Payer: Medicaid Other | Admitting: Psychology

## 2019-07-18 ENCOUNTER — Other Ambulatory Visit: Payer: Self-pay

## 2019-07-18 DIAGNOSIS — F3160 Bipolar disorder, current episode mixed, unspecified: Secondary | ICD-10-CM | POA: Diagnosis not present

## 2019-07-18 DIAGNOSIS — F419 Anxiety disorder, unspecified: Secondary | ICD-10-CM | POA: Diagnosis not present

## 2019-07-18 NOTE — Progress Notes (Signed)
Virtual Visit via Video Note  I connected with Vicki Huynh on 07/18/19 at  1:30 PM EDT by a video enabled telemedicine application and verified that I am speaking with the correct person using two identifiers.   I discussed the limitations of evaluation and management by telemedicine and the availability of in person appointments. The patient expressed understanding and agreed to process.    I discussed the assessment and treatment plan with the patient. The patient was provided an opportunity to ask questions and all were answered. The patient agreed with the plan and demonstrated an understanding of the instructions.   The patient was advised to call back or seek an in-person evaluation if the symptoms worsen or if the condition fails to improve as anticipated.  I provided 58 minutes of non-face-to-face time during this encounter.   [name] [facility]    THERAPIST PROGRESS NOTE  Session Time: 2.38pm-3.05pm  Participation Level: Active  Behavioral Response: Well GroomedAlertaffect wnl  Type of Therapy: Individual Therapy  Treatment Goals addressed: Diagnosis: Bipolar 1 d/o and goal 1.  Interventions: CBT and Strength-based  Summary: Vicki Huynh is a 40 y.o. female who presents with affect wnl.  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  Pt worries that she is not preparing them for passing state test.  Pt reports she is able to reframe negative self talk re: and reflect that she is doing the best she can and putting in effort.  Pt reported that dealt w/ abdominal pain the led her to PCP couple weeks ago as thought might be UTI or infection.  Doctor referred to ED for CTscan, pt reported spent several hours at the ED only to be told  nothing found.  Pt glad nothing found but concerns as to what is going on that experiencing symptoms.  Pt discussed lack of contact w/ mom- feels that mom is avoiding following last conversation.  Pt discussed how she wants mom to put in some efforts to relationship as well.  Pt reported she might contact mom for verbal/text communication but not visiting.  Suicidal/Homicidal: Nowithout intent/plan  Therapist Response: assessed pt current functioning per pt report.  Processed w/pt stressors and how she is working through self talk re:Marland Kitchen  Encouraged reframes acknowledging best she can day to day.  Explored w/ pt self care and advocating for self to doctors re: next steps.  Reflected pt level of readiness re: interactions w/ mom and what she is looking for in relationship- what's in her control.   Plan: Return again in 2 weeks, via webex.  F/u as scheduled w/ Dr. Adele Schilder.  Diagnosis: Bipolar 1 d/o   [name] [facility] 07/18/2019

## 2019-08-18 ENCOUNTER — Ambulatory Visit (INDEPENDENT_AMBULATORY_CARE_PROVIDER_SITE_OTHER): Payer: Medicaid Other | Admitting: Psychology

## 2019-08-18 ENCOUNTER — Other Ambulatory Visit: Payer: Self-pay

## 2019-08-18 DIAGNOSIS — F3160 Bipolar disorder, current episode mixed, unspecified: Secondary | ICD-10-CM

## 2019-08-18 NOTE — Progress Notes (Signed)
Virtual Visit via Video Note  I connected with Vicki Huynh on 08/18/19 at  2:30 PM EDT by a video enabled telemedicine application and verified that I am speaking with the correct person using two identifiers.   I discussed the limitations of evaluation and management by telemedicine and the availability of in person appointments. The patient expressed understanding and agreed to proceed.    I discussed the assessment and treatment plan with the patient. The patient was provided an opportunity to ask questions and all were answered. The patient agreed with the plan and demonstrated an understanding of the instructions.   The patient was advised to call back or seek an in-person evaluation if the symptoms worsen or if the condition fails to improve as anticipated.  I provided 49 minutes of non-face-to-face time during this encounter.   [name] [facility]    THERAPIST PROGRESS NOTE  Session Time: 2.30pm-3.19pm  Participation Level: Active  Behavioral Response: Well GroomedAlertaffect wnl  Type of Therapy: Individual Therapy  Treatment Goals addressed: Diagnosis: Bipolar 1 d/o and goal 1.   Interventions: CBT and Strength-based  Summary: Vicki Huynh is a 39 y.o. female who presents with affect wnl.  pt reported that she has been to her PCP and her diabetes is under good control, but she is concerned about her weight gain of 40lbs in past year.  Pt reported that PCP wants her to discuss Lamictal use w/ Dr. Adele Schilder and potential side effect. Pt discussed wanting to continue w/ current counselor and had called to [facility] to set up and will f/u as didn't hear back. 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  Pt reported that her mom contact day of her son's birthday about dropping by and pt was able to communicate already having plans and offering another day.  Pt expressed frustration that mom put off plan and  still hasn't called.  however pt was able to cope through.  Pt discussed stressor of having to have some dental work and seeking 2nd opinion.     Suicidal/Homicidal: Nowithout intent/plan  Therapist Response: ASsessed pt current functioning per pt report. Processed w/pt coping w/ recent stressors and interactions w/ mom.  reflected how pt was able to set boundary effectively.  Discussed pt wishes w/ continued tx.   Plan: Return again in 2 weeks w/ current counselor at [facility].  Pt to call to [place] to schedule. Pt to continue as scheduled w/ Dr. Adele Schilder.  Diagnosis: Bipolar 1 d/o   [name] [facility] 08/18/2019

## 2019-09-01 ENCOUNTER — Ambulatory Visit (HOSPITAL_COMMUNITY): Payer: Medicaid Other | Admitting: Psychology

## 2019-09-13 ENCOUNTER — Ambulatory Visit (INDEPENDENT_AMBULATORY_CARE_PROVIDER_SITE_OTHER): Payer: Medicaid Other | Admitting: Psychology

## 2019-09-13 DIAGNOSIS — F319 Bipolar disorder, unspecified: Secondary | ICD-10-CM | POA: Diagnosis not present

## 2019-09-14 ENCOUNTER — Ambulatory Visit: Payer: Medicaid Other | Admitting: Psychology

## 2019-09-15 ENCOUNTER — Encounter (HOSPITAL_COMMUNITY): Payer: Self-pay | Admitting: Psychiatry

## 2019-09-15 ENCOUNTER — Telehealth (INDEPENDENT_AMBULATORY_CARE_PROVIDER_SITE_OTHER): Payer: Medicaid Other | Admitting: Psychiatry

## 2019-09-15 ENCOUNTER — Other Ambulatory Visit: Payer: Self-pay

## 2019-09-15 DIAGNOSIS — F3160 Bipolar disorder, current episode mixed, unspecified: Secondary | ICD-10-CM

## 2019-09-15 DIAGNOSIS — F419 Anxiety disorder, unspecified: Secondary | ICD-10-CM

## 2019-09-15 MED ORDER — LURASIDONE HCL 40 MG PO TABS: 40.0000 mg | ORAL_TABLET | Freq: Every day | ORAL | 2 refills | Status: DC

## 2019-09-15 NOTE — Progress Notes (Signed)
Virtual Visit via Telephone Note  I connected with [place] on 09/15/19 at  2:40 PM EDT by telephone and verified that I am speaking with the correct person using two identifiers.   I discussed the limitations, risks, security and privacy concerns of performing an evaluation and management service by telephone and the availability of in person appointments. I also discussed with the patient that there may be a patient responsible charge related to this service. The patient expressed understanding and agreed to proceed.  Patient Location: Home Provider Location: Home Office  History of Present Illness: Patient is evaluated by phone session.  She is taking Latuda 40 mg is helping her mood and anxiety.  She denies any mania, psychosis or any agitation.  She is sleeping good.  She recently tried Topamax however she had swelling and side effects.  Patient told it was given to lose weight but she had to stop because of the allergy reaction.  Overall she feels her energy level is good and she was able to lose 3 pounds since the last visit.  She recently had blood work and she was pleased everything is stable.  Her last hemoglobin A1c was 6.3 which was done by Dr. Kathyrn Lass at [facility].  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  She has no tremors, shakes or any EPS.  She denies any panic attack.  She like to continue her current medication.   Past Psychiatric History:Reviewed. H/Odepression, mood swings, irritability, maniaand impulsive behavior. PCP prescribed Effexor in 2012 which worked for a while. Tried Lamictal (hallucination), Hydroxyzine (groggy).H/Odrinking and cannabis use but sober since 2009. No h/oinpatient or any suicidal attempt.  Recent Results (from the past 2160 hour(s))  Lipase, blood     Status: None   Collection Time: 07/06/19  8:46 PM  Result Value Ref Range   Lipase 29 11 - 51 U/L    Comment: Performed at [facility], [place] [address], [place], [place] [address]  Comprehensive metabolic panel     Status: Abnormal   Collection Time: 07/06/19  8:46 PM  Result Value Ref Range   Sodium 143 135 - 145 mmol/L   Potassium 3.9 3.5 - 5.1 mmol/L   Chloride 111 98 - 111 mmol/L   CO2 24 22 - 32 mmol/L   Glucose, Bld 158 (H) 70 - 99 mg/dL    Comment: Glucose reference range applies only to samples taken after fasting for at least 8 hours.   BUN 14 6 - 20 mg/dL   Creatinine, Ser 0.75 0.44 - 1.00 mg/dL   Calcium 9.1 8.9 - 10.3 mg/dL   Total Protein 7.6 6.5 - 8.1 g/dL   Albumin 4.3 3.5 - 5.0 g/dL   AST 25 15 - 41 U/L   ALT 34 0 - 44 U/L   Alkaline Phosphatase 58 38 - 126 U/L   Total Bilirubin 0.5 0.3 - 1.2 mg/dL   GFR calc non Af Amer >60 >60 mL/min   GFR calc Af Amer >60 >60 mL/min   Anion gap 8 5 - 15    Comment: Performed at [facility], [place] [address], [place], [place] [address]  CBC     Status: None   Collection Time: 07/06/19  8:46 PM  Result Value Ref Range   WBC 10.0 4.0 - 10.5 K/uL   RBC 4.62 3.87 - 5.11 MIL/uL   Hemoglobin 14.1 12.0 - 15.0 g/dL  HCT 40.6 36 - 46 %   MCV 87.9 80.0 - 100.0 fL   MCH 30.5 26.0 - 34.0 pg   MCHC 34.7 30.0 - 36.0 g/dL   RDW 12.0 11.5 - 15.5 %   Platelets 317 150 - 400 K/uL   nRBC 0.0 0.0 - 0.2 %    Comment: Performed at [facility], [place] [address] [place], [place] [address]  Urinalysis, Routine w reflex microscopic     Status: Abnormal   Collection Time: 07/06/19  8:46 PM  Result Value Ref Range   Color, Urine AMBER (A) YELLOW    Comment: BIOCHEMICALS MAY BE AFFECTED BY COLOR   APPearance CLEAR CLEAR   Specific Gravity, Urine 1.026 1.005 - 1.030   pH 5.0 5.0 - 8.0   Glucose, UA NEGATIVE NEGATIVE mg/dL   Hgb urine dipstick NEGATIVE NEGATIVE   Bilirubin Urine NEGATIVE NEGATIVE   Ketones, ur NEGATIVE NEGATIVE mg/dL   Protein, ur 30 (A) NEGATIVE mg/dL   Nitrite POSITIVE (A) NEGATIVE   Leukocytes,Ua  NEGATIVE NEGATIVE   RBC / HPF 0-5 0 - 5 RBC/hpf   WBC, UA 0-5 0 - 5 WBC/hpf   Bacteria, UA RARE (A) NONE SEEN   Squamous Epithelial / LPF 0-5 0 - 5   Mucus PRESENT     Comment: Performed at [facility], [place] [address], [place], [place] [address]  I-Stat beta hCG blood, ED     Status: None   Collection Time: 07/06/19  9:00 PM  Result Value Ref Range   I-stat hCG, quantitative <5.0 <5 mIU/mL   Comment 3            Comment:   GEST. AGE      CONC.  (mIU/mL)   <=1 WEEK        5 - 50     2 WEEKS       50 - 500     3 WEEKS       100 - 10,000     4 WEEKS     1,000 - 30,000        FEMALE AND NON-PREGNANT FEMALE:     LESS THAN 5 mIU/mL      Psychiatric Specialty Exam: Physical Exam  Review of Systems  Weight 221 lb (100.2 kg).There is no height or weight on file to calculate BMI.  General Appearance: NA  Eye Contact:  NA  Speech:  Clear and Coherent  Volume:  Normal  Mood:  Euthymic  Affect:  NA  Thought Process:  Goal Directed  Orientation:  Full (Time, Place, and Person)  Thought Content:  WDL  Suicidal Thoughts:  No  Homicidal Thoughts:  No  Memory:  Immediate;   Good Recent;   Good Remote;   Good  Judgement:  Good  Insight:  Good  Psychomotor Activity:  NA  Concentration:  Concentration: Good and Attention Span: Good  Recall:  Good  Fund of Knowledge:  Good  Language:  Good  Akathisia:  No  Handed:  Right  AIMS (if indicated):     Assets:  Communication Skills Desire for Improvement Housing Resilience Social Support Talents/Skills  ADL's:  Intact  Cognition:  WNL  Sleep:   good      Assessment and Plan: Bipolar disorder type I.  Anxiety.  Patient is a stable on Latuda 40 mg daily.  She is getting therapy from Ar[facility] Endo.  Discussed medication side effects and benefits.  Reviewed blood work results.  Her CBC is normal and her last hemoglobin A1c is 6.3 which was done by Kathyrn Lass.  Recommended to call us back if she has any question  or any concern.  Follow-up in 3 months.  Follow Up Instructions:    I discussed the assessment and treatment plan with the patient. The patient was provided an opportunity to ask questions and all were answered. The patient agreed with the plan and demonstrated an understanding of the instructions.   The patient was advised to call back or seek an in-person evaluation if the symptoms worsen or if the condition fails to improve as anticipated.  I provided 20 minutes of non-face-to-face time during this encounter.   [name], MD

## 2019-10-04 ENCOUNTER — Ambulatory Visit (INDEPENDENT_AMBULATORY_CARE_PROVIDER_SITE_OTHER): Payer: Medicaid Other | Admitting: Psychology

## 2019-10-04 ENCOUNTER — Other Ambulatory Visit: Payer: Self-pay | Admitting: Family Medicine

## 2019-10-04 ENCOUNTER — Other Ambulatory Visit (HOSPITAL_COMMUNITY)
Admission: RE | Admit: 2019-10-04 | Discharge: 2019-10-04 | Disposition: A | Discharge status: Home / Self Care | Payer: Medicaid Other | Source: Ambulatory Visit | Attending: Family Medicine | Admitting: Family Medicine

## 2019-10-04 DIAGNOSIS — F319 Bipolar disorder, unspecified: Secondary | ICD-10-CM

## 2019-10-04 DIAGNOSIS — Z124 Encounter for screening for malignant neoplasm of cervix: Secondary | ICD-10-CM | POA: Insufficient documentation

## 2019-10-06 LAB — CYTOLOGY - PAP
Comment: NEGATIVE
Diagnosis: NEGATIVE
High risk HPV: NEGATIVE

## 2019-10-18 ENCOUNTER — Ambulatory Visit (INDEPENDENT_AMBULATORY_CARE_PROVIDER_SITE_OTHER): Payer: Medicaid Other | Admitting: Psychology

## 2019-10-18 DIAGNOSIS — F319 Bipolar disorder, unspecified: Secondary | ICD-10-CM | POA: Diagnosis not present

## 2019-11-02 ENCOUNTER — Ambulatory Visit (INDEPENDENT_AMBULATORY_CARE_PROVIDER_SITE_OTHER): Payer: Medicaid Other | Admitting: Psychology

## 2019-11-02 DIAGNOSIS — F319 Bipolar disorder, unspecified: Secondary | ICD-10-CM | POA: Diagnosis not present

## 2019-11-23 ENCOUNTER — Ambulatory Visit (INDEPENDENT_AMBULATORY_CARE_PROVIDER_SITE_OTHER): Payer: Medicaid Other | Admitting: Psychology

## 2019-11-23 DIAGNOSIS — F319 Bipolar disorder, unspecified: Secondary | ICD-10-CM | POA: Diagnosis not present

## 2019-12-08 ENCOUNTER — Ambulatory Visit (INDEPENDENT_AMBULATORY_CARE_PROVIDER_SITE_OTHER): Payer: Medicaid Other | Admitting: Psychology

## 2019-12-08 DIAGNOSIS — F319 Bipolar disorder, unspecified: Secondary | ICD-10-CM | POA: Diagnosis not present

## 2019-12-12 ENCOUNTER — Telehealth (INDEPENDENT_AMBULATORY_CARE_PROVIDER_SITE_OTHER): Payer: Medicaid Other | Admitting: Psychiatry

## 2019-12-12 ENCOUNTER — Encounter (HOSPITAL_COMMUNITY): Payer: Self-pay | Admitting: Psychiatry

## 2019-12-12 ENCOUNTER — Other Ambulatory Visit: Payer: Self-pay

## 2019-12-12 VITALS — Wt 230.0 lb

## 2019-12-12 DIAGNOSIS — F3160 Bipolar disorder, current episode mixed, unspecified: Secondary | ICD-10-CM | POA: Diagnosis not present

## 2019-12-12 DIAGNOSIS — F419 Anxiety disorder, unspecified: Secondary | ICD-10-CM | POA: Diagnosis not present

## 2019-12-12 MED ORDER — HYDROXYZINE PAMOATE 25 MG PO CAPS: 25.0000 mg | ORAL_CAPSULE | Freq: Every day | ORAL | 2 refills | Status: DC

## 2019-12-12 MED ORDER — LURASIDONE HCL 40 MG PO TABS: 40.0000 mg | ORAL_TABLET | Freq: Every day | ORAL | 2 refills | Status: DC

## 2019-12-12 NOTE — Progress Notes (Signed)
Virtual Visit via Telephone Note  I connected with Vicki Huynh on 12/12/19 at  1:00 PM EDT by telephone and verified that I am speaking with the correct person using two identifiers.  Location: Patient: home Provider: home office   I discussed the limitations, risks, security and privacy concerns of performing an evaluation and management service by telephone and the availability of in person appointments. I also discussed with the patient that there may be a patient responsible charge related to this service. The patient expressed understanding and agreed to proceed.   History of Present Illness: Patient is evaluated by phone session.  She is taking Latuda but sometimes she feels restless and anxious.  She struggle for sleep and she does not sleep she feels anxious.  She is taking over-the-counter Benadryl 50 mg which helped her sleep but she still feels anxious during the day.  She denies any paranoia, hallucination or any suicidal thoughts.  She like to continue [place] which she feels helping her mood, anger and depression.  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  Her appetite is okay.  She is trying to lose weight since she had gained weight in recent months.  She denies any rash or any itching.  She denies drinking or using any illegal substances.   Past Psychiatric History: H/Odepression, mood swings, irritability, maniaand impulsive behavior. PCP prescribed Effexor in 2012 which worked for a while. Tried Lamictal (hallucination), Hydroxyzine (groggy).H/Odrinking and cannabis use but sober since 2009. No h/oinpatient or any suicidal attempt.   Psychiatric Specialty Exam: Physical Exam  Review of Systems  Weight 230 lb (104.3 kg).There is no height or weight on file to calculate BMI.  General Appearance: NA  Eye Contact:  NA  Speech:  Normal Rate  Volume:  Normal  Mood:  Anxious  Affect:  NA  Thought Process:  Goal Directed  Orientation:  Full (Time,  Place, and Person)  Thought Content:  Logical  Suicidal Thoughts:  No  Homicidal Thoughts:  No  Memory:  Immediate;   Good Recent;   Good Remote;   Good  Judgement:  Intact  Insight:  Present  Psychomotor Activity:  Restlessness  Concentration:  Concentration: Good and Attention Span: Good  Recall:  Good  Fund of Knowledge:  Good  Language:  Good  Akathisia:  No  Handed:  Right  AIMS (if indicated):     Assets:  Communication Skills Desire for [place] Talents/Skills Transportation  ADL's:  Intact  Cognition:  WNL  Sleep:   fair      Assessment and Plan: Bipolar disorder type I.  Anxiety.  Patient is taking Benadryl 50 mg over-the-counter which is helping her sleep but does not help anxiety.  I recommend to try 25 mg to help her anxiety, restlessness and can also help her sleep.  Recommend not to take with the Benadryl.  Continue Latuda 40 mg daily.  Patient like to keep appointment in 3 months but agreed to give [place] a call back if symptoms do not improve.  Follow-up in 3 months.  Follow Up Instructions:    I discussed the assessment and treatment plan with the patient. The patient was provided an opportunity to ask questions and all were answered. The patient agreed with the plan and demonstrated an understanding of the instructions.   The patient was advised to call back or seek an in-person evaluation if the symptoms worsen or if the condition fails to improve as anticipated.  I provided 21 minutes of non-face-to-face time during this encounter.   [name], MD

## 2019-12-21 ENCOUNTER — Ambulatory Visit (INDEPENDENT_AMBULATORY_CARE_PROVIDER_SITE_OTHER): Payer: Medicaid Other | Admitting: Psychology

## 2019-12-21 ENCOUNTER — Telehealth (HOSPITAL_COMMUNITY): Payer: Self-pay | Admitting: *Deleted

## 2019-12-21 DIAGNOSIS — F319 Bipolar disorder, unspecified: Secondary | ICD-10-CM | POA: Diagnosis not present

## 2019-12-21 NOTE — Telephone Encounter (Signed)
Writer spoke with pt to inform that she may take two of the 25mg  Vistaril at hs. Pt expressed concerns about being to groggy in the a.m as this has been an issue and pt is anxious not knowing if it's related to a physical issue or if it's the medication. Pt instructed to try 50mg  tonight and Microbiologist tomorrow to update.

## 2019-12-21 NOTE — Telephone Encounter (Signed)
Pt called c/o decreased sleep and increased anxiety asking if you would increase Vistaril although she says "it's not working". Pt next appointment is on 03/07/20. Please review and advise.

## 2019-12-21 NOTE — Telephone Encounter (Signed)
She can try two capsule (50 mg) to help her sleep and anxiety.

## 2019-12-22 NOTE — Telephone Encounter (Signed)
Ok

## 2019-12-27 ENCOUNTER — Emergency Department (HOSPITAL_COMMUNITY)
Admission: EM | Admit: 2019-12-27 | Discharge: 2019-12-28 | Disposition: A | Discharge status: Home / Self Care | Payer: Medicaid Other | Attending: Emergency Medicine | Admitting: Emergency Medicine

## 2019-12-27 ENCOUNTER — Emergency Department (HOSPITAL_COMMUNITY): Payer: Medicaid Other

## 2019-12-27 ENCOUNTER — Other Ambulatory Visit: Payer: Self-pay

## 2019-12-27 ENCOUNTER — Encounter (HOSPITAL_COMMUNITY): Payer: Self-pay

## 2019-12-27 DIAGNOSIS — R0602 Shortness of breath: Secondary | ICD-10-CM | POA: Insufficient documentation

## 2019-12-27 DIAGNOSIS — Z20822 Contact with and (suspected) exposure to covid-19: Secondary | ICD-10-CM | POA: Diagnosis not present

## 2019-12-27 DIAGNOSIS — N644 Mastodynia: Secondary | ICD-10-CM | POA: Insufficient documentation

## 2019-12-27 DIAGNOSIS — R079 Chest pain, unspecified: Secondary | ICD-10-CM | POA: Diagnosis not present

## 2019-12-27 HISTORY — DX: Bipolar disorder, unspecified: F31.9

## 2019-12-27 LAB — BASIC METABOLIC PANEL
Anion gap: 8 (ref 5–15)
BUN: 13 mg/dL (ref 6–20)
CO2: 27 mmol/L (ref 22–32)
Calcium: 9.2 mg/dL (ref 8.9–10.3)
Chloride: 103 mmol/L (ref 98–111)
Creatinine, Ser: 0.7 mg/dL (ref 0.44–1.00)
GFR calc Af Amer: >60 mL/min (ref >60)
GFR calc non Af Amer: >60 mL/min (ref >60)
Glucose, Bld: 212 mg/dL — ABNORMAL HIGH (ref 70–99)
Potassium: 3.8 mmol/L (ref 3.5–5.1)
Sodium: 138 mmol/L (ref 135–145)

## 2019-12-27 LAB — CBC
HCT: 38.6 % (ref 36.0–46.0)
Hemoglobin: 13.7 g/dL (ref 12.0–15.0)
MCH: 30.7 pg (ref 26.0–34.0)
MCHC: 35.5 g/dL (ref 30.0–36.0)
MCV: 86.5 fL (ref 80.0–100.0)
Platelets: 339 10*3/uL (ref 150–400)
RBC: 4.46 MIL/uL (ref 3.87–5.11)
RDW: 11.9 % (ref 11.5–15.5)
WBC: 10.9 10*3/uL — ABNORMAL HIGH (ref 4.0–10.5)
nRBC: 0 % (ref 0.0–0.2)

## 2019-12-27 LAB — TROPONIN I (HIGH SENSITIVITY): Troponin I (High Sensitivity): <2 ng/L (ref <18)

## 2019-12-27 LAB — I-STAT BETA HCG BLOOD, ED (NOT ORDERABLE): I-stat hCG, quantitative: <5 m[IU]/mL (ref <5)

## 2019-12-27 NOTE — ED Provider Notes (Signed)
[facility] DEPT Provider Note   CSN: [id number] Arrival date & time: 12/27/19  1723     History Chief Complaint  Patient presents with  . Shortness of Breath  . Chest Pain    Vicki Huynh is a 39 y.o. female.  Patient with past medical history notable for diabetes, bipolar, depression, and anxiety presents to the emergency department with a chief complaint of shortness of breath and chest pain. She states that the pain started 2 days ago. She states that she has increased pain when she takes deep breath. She also reports having the pain under bilateral breasts. She states that she had some burning sensation in her throat, but took some Tums for this, this resolved those symptoms, but her shortness of breath and chest pain remained. She denies any fevers, chills, or productive cough. She is not vaccinated against Covid-19. She denies any known exposures. She denies any history of PE or DVT. She is not anticoagulated. She is not on any OCPs. Denies any recent long travel or surgery.  The history is provided by the patient. No language interpreter was used.       Past Medical History:  Diagnosis Date  . Anxiety   . Bipolar depression ([place])   . Depression   . Diabetic mellitus, gestational     Patient Active Problem List   Diagnosis Date Noted  . Menorrhagia with regular cycle 10/09/2014  . Dysmenorrhea 10/09/2014  . Pelvic pain in female 10/09/2014  . 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 Pain in joint, upper arm 02/06/2014  . Paresthesias 02/06/2014    Past Surgical History:  Procedure Laterality Date  . CHOLECYSTECTOMY    . HYSTEROSCOPY WITH NOVASURE N/A 10/09/2014   Procedure: HYSTEROSCOPY WITH NOVASURE;  Surgeon: Cheri Fowler, MD;  Location: [facility];  Service: Gynecology;  Laterality: N/A;  . LAPAROSCOPIC BILATERAL SALPINGECTOMY Bilateral 10/09/2014   Procedure: LAPAROSCOPIC BILATERAL SALPINGECTOMY with removal of Essure coils;  Surgeon:  Cheri Fowler, MD;  Location: [facility];  Service: Gynecology;  Laterality: Bilateral;  1hr OR time  . WISDOM TOOTH EXTRACTION       OB History    Gravida  4   Para  2   Term  2   Preterm      AB  2   Living  2     SAB  1   TAB  1   Ectopic      Multiple      Live Births              Family History  Problem Relation Age of Onset  . Rheum arthritis Mother   . Rheum arthritis Father   . Alcohol abuse Father        in recovery  . Depression Father   . Heart attack Father   . Drug abuse Brother     Social History   Tobacco Use  . Smoking status: Never Smoker  . Smokeless tobacco: Never Used  Vaping Use  . Vaping Use: Never used  Substance Use Topics  . Alcohol use: No    Alcohol/week: 0.0 standard drinks  . Drug use: No    Home Medications Prior to Admission medications   Medication Sig Start Date End Date Taking? Authorizing Provider  ACCU-CHEK FASTCLIX LANCETS [place]  06/20/17   [provider]  ACCU-CHEK GUIDE test strip  06/20/17   [provider]  bismuth subsalicylate (PEPTO BISMOL) 262 MG chewable tablet Chew 524 mg by  mouth daily as needed for indigestion.    [provider]  diphenhydrAMINE (BENADRYL) 25 mg capsule Take 50 mg by mouth at bedtime as needed for sleep.    [provider]  glimepiride (AMARYL) 4 MG tablet Take 4 mg by mouth daily with breakfast.    Kathyrn Lass, MD  hydrOXYzine (VISTARIL) 25 MG capsule Take 1 capsule (25 mg total) by mouth at bedtime. 12/12/19   Arfeen, Arlyce Harman, MD  losartan (COZAAR) 25 MG tablet Take 25 mg by mouth every evening.  12/16/17   [provider]  lurasidone (LATUDA) 40 MG TABS tablet Take 1 tablet (40 mg total) by mouth daily with breakfast. 12/12/19   Arfeen, Arlyce Harman, MD  pantoprazole (PROTONIX) 20 MG tablet Take 20 mg by mouth daily.  11/30/18   [provider]  rosuvastatin (CRESTOR) 5 MG tablet Take 5 mg by mouth every other day. 12/28/17   [provider]    Allergies    Lamictal [lamotrigine], Topamax [topiramate], Nickel, and Wool alcohol [lanolin alcohol]  Review of Systems   Review of Systems  All other systems reviewed and are negative.   Physical Exam Updated Vital Signs BP (!) 123/97 (BP Location: Right Arm)   Pulse 86   Temp 98.5 F (36.9 C) (Oral)   Resp 18   Ht 5\' 7"  (1.702 m)   Wt 104.3 kg   LMP 12/08/2019 (Approximate)   SpO2 97%   BMI 36.02 kg/m   Physical Exam Vitals and nursing note reviewed.  Constitutional:      General: She is not in acute distress.    Appearance: She is well-developed.  HENT:     Head: Normocephalic and atraumatic.  Eyes:     Conjunctiva/sclera: Conjunctivae normal.  Cardiovascular:     Rate and Rhythm: Normal rate and regular rhythm.     Heart sounds: No murmur heard.      Comments: No chest wall tenderness Pulmonary:     Effort: Pulmonary effort is normal. No respiratory distress.     Breath sounds: Normal breath sounds.  Abdominal:     Palpations: Abdomen is soft.     Tenderness: There is no abdominal tenderness.     Comments: No abdominal tenderness  Musculoskeletal:        General: Normal range of motion.     Cervical back: Neck supple.  Skin:    General: Skin is warm and dry.  Neurological:     Mental Status: She is alert and oriented to person, place, and time.  Psychiatric:        Mood and Affect: Mood normal.        Behavior: Behavior normal.     ED Results / Procedures / Treatments   Labs (all labs ordered are listed, but only abnormal results are displayed) Labs Reviewed  BASIC METABOLIC PANEL - Abnormal; Notable for the following components:      Result Value   Glucose, Bld 212 (*)    All other components within normal limits  CBC - Abnormal; Notable for the following components:   WBC 10.9 (*)    All other components within normal limits  RESPIRATORY PANEL BY RT PCR (FLU A&B, COVID)  D-DIMER, QUANTITATIVE (NOT AT [facility])  I-STAT BETA  HCG BLOOD, ED ([facility], [facility], [facility] ONLY)  I-STAT BETA HCG BLOOD, ED (NOT ORDERABLE)  CBG MONITORING, ED  TROPONIN I (HIGH SENSITIVITY)  TROPONIN I (HIGH SENSITIVITY)    EKG None  Radiology DG Chest 2  View  Result Date: 12/27/2019 CLINICAL DATA:  Chest pain EXAM: CHEST - 2 VIEW COMPARISON:  05/01/2016 FINDINGS: The heart size and mediastinal contours are within normal limits. Both lungs are clear. The visualized skeletal structures are unremarkable. IMPRESSION: No active cardiopulmonary disease. Electronically Signed   By: Donavan Foil M.D.   On: 12/27/2019 18:41    Procedures Procedures (including critical care time)  Medications Ordered in ED Medications - No data to display  ED Course  I have reviewed the triage vital signs and the nursing notes.  Pertinent labs & imaging results that were available during my care of the patient were reviewed by me and considered in my medical decision making (see chart for details).    MDM Rules/Calculators/A&P                          This patient complains of chest pain and shortness of breath, this involves an extensive number of treatment options, and is a complaint that carries with it a high risk of complications and morbidity.    Differential Dx GERD, anxiety, PE, less likely ACS or dissection  Pertinent Labs I ordered, reviewed, and interpreted labs, which included CBC, BMP, troponin, CBG notable for mildly elevated white blood cell count at 10.4, D-dimer is pending, troponin is negative x2  D-dimer negative, doubt PE.  Covid negative.  Imaging Interpretation I ordered imaging studies which included chest x-ray.  I independently visualized and interpreted the chest x-ray, which showed no obvious abnormality.   Reassessments After the interventions stated above, I reevaluated the patient and found to be in good condition.   Consultants None  Plan Reassuring work-up for ACS, doubt PE, symptoms improved with Tums, question GERD or  peptic ulcer disease.  Patient is taking Protonix, will add Carafate.    Final Clinical Impression(s) / ED Diagnoses Final diagnoses:  Chest pain, unspecified type  Shortness of breath    Rx / DC Orders ED Discharge Orders    None       Montine Circle, PA-C 48/18/56 [id number]    Delora Fuel, MD 70/26/37 [phone]

## 2019-12-27 NOTE — ED Triage Notes (Addendum)
Patient c/o SOB and heart burn x 2 days. Patient states she had tingling of the left arm yesterday.  Patienta reports that she had chest pain yesterday, but today the pain is upper abdomen and breasts .

## 2019-12-28 LAB — TROPONIN I (HIGH SENSITIVITY): Troponin I (High Sensitivity): <2 ng/L (ref <18)

## 2019-12-28 LAB — RESPIRATORY PANEL BY RT PCR (FLU A&B, COVID)
Influenza A by PCR: NEGATIVE
Influenza B by PCR: NEGATIVE
SARS Coronavirus 2 by RT PCR: NEGATIVE

## 2019-12-28 LAB — D-DIMER, QUANTITATIVE: D-Dimer, Quant: 0.36 ug/mL-FEU (ref 0.00–0.50)

## 2019-12-28 LAB — CBG MONITORING, ED: Glucose-Capillary: 138 mg/dL — ABNORMAL HIGH (ref 70–99)

## 2019-12-28 MED ORDER — SUCRALFATE 1 G PO TABS: 1.0000 g | ORAL_TABLET | Freq: Three times a day (TID) | ORAL | 0 refills | Status: DC

## 2019-12-29 ENCOUNTER — Telehealth (HOSPITAL_COMMUNITY): Payer: Self-pay | Admitting: *Deleted

## 2019-12-29 DIAGNOSIS — F419 Anxiety disorder, unspecified: Secondary | ICD-10-CM

## 2019-12-29 MED ORDER — HYDROXYZINE PAMOATE 50 MG PO CAPS: 50.0000 mg | ORAL_CAPSULE | Freq: Every day | ORAL | 2 refills | Status: DC

## 2019-12-29 NOTE — Telephone Encounter (Signed)
Done

## 2019-12-29 NOTE — Telephone Encounter (Signed)
Patient reported tha t she has been taking 50 mg ofVistaril at night and it is working well for her. She would like a script for 50 mg

## 2020-01-04 ENCOUNTER — Ambulatory Visit: Payer: Medicaid Other | Admitting: Psychology

## 2020-01-18 ENCOUNTER — Ambulatory Visit (INDEPENDENT_AMBULATORY_CARE_PROVIDER_SITE_OTHER): Payer: Medicaid Other | Admitting: Psychology

## 2020-01-18 DIAGNOSIS — F319 Bipolar disorder, unspecified: Secondary | ICD-10-CM

## 2020-02-01 ENCOUNTER — Ambulatory Visit: Payer: Medicaid Other | Admitting: Psychology

## 2020-02-15 ENCOUNTER — Ambulatory Visit: Payer: Medicaid Other | Admitting: Psychology

## 2020-02-29 ENCOUNTER — Ambulatory Visit: Payer: Medicaid Other | Admitting: Psychology

## 2020-03-07 ENCOUNTER — Other Ambulatory Visit: Payer: Self-pay

## 2020-03-07 ENCOUNTER — Encounter (HOSPITAL_COMMUNITY): Payer: Self-pay | Admitting: Psychiatry

## 2020-03-07 ENCOUNTER — Telehealth (INDEPENDENT_AMBULATORY_CARE_PROVIDER_SITE_OTHER): Payer: Medicaid Other | Admitting: Psychiatry

## 2020-03-07 DIAGNOSIS — F3160 Bipolar disorder, current episode mixed, unspecified: Secondary | ICD-10-CM

## 2020-03-07 DIAGNOSIS — F419 Anxiety disorder, unspecified: Secondary | ICD-10-CM | POA: Diagnosis not present

## 2020-03-07 MED ORDER — LURASIDONE HCL 60 MG PO TABS: 60.0000 mg | ORAL_TABLET | Freq: Every day | ORAL | 2 refills | Status: DC

## 2020-03-07 MED ORDER — HYDROXYZINE PAMOATE 50 MG PO CAPS: 50.0000 mg | ORAL_CAPSULE | Freq: Every day | ORAL | 2 refills | Status: DC

## 2020-03-07 NOTE — Progress Notes (Signed)
Virtual Visit via Telephone Note  I connected with [place] on 03/07/20 at  1:00 PM EST by telephone and verified that I am speaking with the correct person using two identifiers.  Location: Patient: Home Provider: Home Office   I discussed the limitations, risks, security and privacy concerns of performing an evaluation and management service by telephone and the availability of in person appointments. I also discussed with the patient that there may be a patient responsible charge related to this service. The patient expressed understanding and agreed to proceed.   History of Present Illness: Patient is evaluated by phone session.  She is taking Latuda and now hydroxyzine 50 mg at bedtime.  She feels her anxiety is better but she still have some mood lability.  She feels the [place] wears off and she started to get more irritable.  She is sleeping better with the hydroxyzine.  She is no longer taking Benadryl.  She noticed her relationship with her husband is improving and she denies any arguments for anger issues.  She denies any suicidal thoughts or homicidal thoughts.  She admitted few more pounds weight gain in recent months.  She is hoping to start diet pill that can give her a Kick start for her diet plan.  She denies drinking or using any illegal substances.  She has no tremors shakes or any EPS.  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     Past Psychiatric History: H/Odepression, mood swings, irritability, maniaand impulsive behavior. PCP prescribed Effexor in 2012 which worked for a while. Tried Lamictal (hallucination), Hydroxyzine (groggy).H/Odrinking and cannabis use but sober since 2009. No h/oinpatient or any suicidal attempt.  Psychiatric Specialty Exam: Physical Exam  Review of Systems  Weight 236 lb (107 kg).There is no height or weight on file to calculate BMI.  General Appearance: NA  Eye Contact:  NA  Speech:  Clear and Coherent  Volume:   Normal  Mood:  Euthymic  Affect:  NA  Thought Process:  Goal Directed  Orientation:  Full (Time, Place, and Person)  Thought Content:  Rumination  Suicidal Thoughts:  No  Homicidal Thoughts:  No  Memory:  Immediate;   Good Recent;   Good Remote;   Good  Judgement:  Intact  Insight:  Present  Psychomotor Activity:  NA  Concentration:  Concentration: Good and Attention Span: Good  Recall:  Good  Fund of Knowledge:  Good  Language:  Good  Akathisia:  No  Handed:  Right  AIMS (if indicated):     Assets:  Communication Skills Desire for Improvement Housing Resilience Social Support Transportation  ADL's:  Intact  Cognition:  WNL  Sleep:   better      Assessment and Plan: Bipolar disorder type I.  Anxiety.  Patient is no longer taking Benadryl but taking 50 mg hydroxyzine that is helping her sleep and anxiety.  We talked about increasing Latuda 60 mg to help her residual mood lability.  So far she is tolerating her medication and reported no side effects.  I will increase Latuda 60 mg and recommended to call us back if she feels medicine causing any side effects.  She agreed with the plan.  Discussed medication side effects and benefits.  Follow-up in 3 months.  Follow Up Instructions:    I discussed the assessment and treatment plan with the patient. The patient was provided an opportunity to ask questions and all were answered. The patient agreed with the plan and demonstrated an  understanding of the instructions.   The patient was advised to call back or seek an in-person evaluation if the symptoms worsen or if the condition fails to improve as anticipated.  I provided 13 minutes of non-face-to-face time during this encounter.   [name], MD

## 2020-03-14 ENCOUNTER — Ambulatory Visit: Payer: Medicaid Other | Admitting: Psychology

## 2020-04-04 ENCOUNTER — Ambulatory Visit: Payer: Medicaid Other | Admitting: Psychology

## 2020-04-30 ENCOUNTER — Telehealth (HOSPITAL_COMMUNITY): Payer: Self-pay | Admitting: *Deleted

## 2020-04-30 NOTE — Telephone Encounter (Signed)
PA for Latuda 60 mg submitted via CoverMyMeds. Key: [id number]

## 2020-05-17 ENCOUNTER — Telehealth (HOSPITAL_COMMUNITY): Payer: Self-pay | Admitting: *Deleted

## 2020-05-17 NOTE — Telephone Encounter (Signed)
PA for Latuda 60mg  tablets approved 04/30/20 though 04/30/21. PA case # [id number] CoverMyMeds

## 2020-06-04 ENCOUNTER — Other Ambulatory Visit: Payer: Self-pay

## 2020-06-04 ENCOUNTER — Telehealth (INDEPENDENT_AMBULATORY_CARE_PROVIDER_SITE_OTHER): Payer: Medicaid Other | Admitting: Psychiatry

## 2020-06-04 ENCOUNTER — Encounter (HOSPITAL_COMMUNITY): Payer: Self-pay | Admitting: Psychiatry

## 2020-06-04 VITALS — Wt 233.0 lb

## 2020-06-04 DIAGNOSIS — F419 Anxiety disorder, unspecified: Secondary | ICD-10-CM | POA: Diagnosis not present

## 2020-06-04 DIAGNOSIS — F3160 Bipolar disorder, current episode mixed, unspecified: Secondary | ICD-10-CM

## 2020-06-04 MED ORDER — BUSPIRONE HCL 5 MG PO TABS: 5.0000 mg | ORAL_TABLET | Freq: Two times a day (BID) | ORAL | 2 refills | Status: DC

## 2020-06-04 MED ORDER — HYDROXYZINE PAMOATE 50 MG PO CAPS: 50.0000 mg | ORAL_CAPSULE | Freq: Every day | ORAL | 2 refills | Status: DC

## 2020-06-04 MED ORDER — LATUDA 60 MG PO TABS: 60.0000 mg | ORAL_TABLET | Freq: Every day | ORAL | 2 refills | Status: DC

## 2020-06-04 NOTE — Progress Notes (Signed)
Virtual Visit via Telephone Note  I connected with [place] on 06/04/20 at  1:00 PM EST by telephone and verified that I am speaking with the correct person using two identifiers.  Location: Patient: Home Provider: Home office   I discussed the limitations, risks, security and privacy concerns of performing an evaluation and management service by telephone and the availability of in person appointments. I also discussed with the patient that there may be a patient responsible charge related to this service. The patient expressed understanding and agreed to proceed.   History of Present Illness: Patient is evaluated by phone session.  We increased the Latuda on the last visit and she noticed improvement in her mood, irritability and depression but she is still struggle with anxiety.  She takes the hydroxyzine at night which helps sleep but she still struggle with anxiety during the day.  Recently she saw her physician Dr. Kathyrn Lass who adjusted her diabetes medication.  She is now taking metformin once a day and may go to twice a day on the follow-up visit.  She does not describe the triggers for anxiety but sometimes she feels nervous anxious and does not want to go outside.  Her irritability, impulsive behavior is much better with the Latuda.  She tried phentermine to lose weight but it did not help and she quit taking it.  She has no tremors, shakes or any EPS.  She reported her relationship with the husband is much better.  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  Patient like to try something different for anxiety.  Past Psychiatric History: H/Odepression, mood swings, irritability, maniaand impulsive behavior. PCP prescribed Effexor in 2012 which worked for a while. Tried Lamictal (hallucination), Hydroxyzine (groggy).H/Odrinking and cannabis use but sober since 2009. No h/oinpatient or any suicidal attempt.   Psychiatric Specialty Exam: Physical Exam   Review of Systems  Weight 233 lb (105.7 kg).Body mass index is 36.49 kg/m.  General Appearance: NA  Eye Contact:  NA  Speech:  Clear and Coherent  Volume:  Normal  Mood:  Anxious  Affect:  NA  Thought Process:  Goal Directed  Orientation:  Full (Time, Place, and Person)  Thought Content:  Logical  Suicidal Thoughts:  No  Homicidal Thoughts:  No  Memory:  Immediate;   Good Recent;   Good Remote;   Good  Judgement:  Intact  Insight:  Present  Psychomotor Activity:  NA  Concentration:  Concentration: Good and Attention Span: Good  Recall:  Good  Fund of Knowledge:  Good  Language:  Good  Akathisia:  No  Handed:  Right  AIMS (if indicated):     Assets:  Communication Skills Desire for Improvement Housing Resilience Social Support Talents/Skills Transportation  ADL's:  Intact  Cognition:  WNL  Sleep:   ok      Assessment and Plan: Bipolar disorder type I.  Anxiety.  Patient continues to struggle with anxiety but her mood swings and anger is much better with increased Latuda.  I recommend to try BuSpar 5 mg twice a day.  If she cannot sleep then she can try hydroxyzine which is prescribed.  She like to keep the Latuda dose since it is helping her mood and impulsive behavior.  Discussed medication side effects and benefits.  Recommended to call us back if she has any question or any concern.  Patient will have a follow-up appointment with her physician for management of diabetes.  Her last hemoglobin A1c  was 7.4 which was done in December.  She is now taking metformin and hoping it helps the weight loss.  Follow-up in 3 months.  Follow Up Instructions:    I discussed the assessment and treatment plan with the patient. The patient was provided an opportunity to ask questions and all were answered. The patient agreed with the plan and demonstrated an understanding of the instructions.   The patient was advised to call back or seek an in-person evaluation if the symptoms  worsen or if the condition fails to improve as anticipated.  I provided 17 minutes of non-face-to-face time during this encounter.   [name], MD

## 2020-06-12 ENCOUNTER — Other Ambulatory Visit: Payer: Self-pay | Admitting: Family Medicine

## 2020-06-12 DIAGNOSIS — N926 Irregular menstruation, unspecified: Secondary | ICD-10-CM

## 2020-06-15 ENCOUNTER — Ambulatory Visit
Admission: RE | Admit: 2020-06-15 | Discharge: 2020-06-15 | Disposition: A | Discharge status: Home / Self Care | Payer: Medicaid Other | Source: Ambulatory Visit | Attending: Family Medicine | Admitting: Family Medicine

## 2020-06-15 DIAGNOSIS — N926 Irregular menstruation, unspecified: Secondary | ICD-10-CM

## 2020-06-25 ENCOUNTER — Telehealth (HOSPITAL_COMMUNITY): Payer: Self-pay | Admitting: *Deleted

## 2020-06-25 ENCOUNTER — Other Ambulatory Visit (HOSPITAL_COMMUNITY): Payer: Self-pay | Admitting: Psychiatry

## 2020-06-25 DIAGNOSIS — F419 Anxiety disorder, unspecified: Secondary | ICD-10-CM

## 2020-06-25 NOTE — Telephone Encounter (Signed)
Ok

## 2020-06-25 NOTE — Telephone Encounter (Signed)
Patient called and stated that she had stopped the Buspar due to side effects.  She is going to continue taking Vistaril. FYI

## 2020-08-30 ENCOUNTER — Other Ambulatory Visit (HOSPITAL_COMMUNITY): Payer: Self-pay | Admitting: Psychiatry

## 2020-08-30 DIAGNOSIS — F419 Anxiety disorder, unspecified: Secondary | ICD-10-CM

## 2020-08-30 DIAGNOSIS — F3160 Bipolar disorder, current episode mixed, unspecified: Secondary | ICD-10-CM

## 2020-09-03 ENCOUNTER — Telehealth (HOSPITAL_COMMUNITY): Payer: Self-pay | Admitting: *Deleted

## 2020-09-03 NOTE — Telephone Encounter (Signed)
Opened in error

## 2020-09-04 ENCOUNTER — Encounter (HOSPITAL_COMMUNITY): Payer: Self-pay | Admitting: Psychiatry

## 2020-09-04 ENCOUNTER — Telehealth (HOSPITAL_COMMUNITY): Payer: Medicaid Other | Admitting: Psychiatry

## 2020-09-04 ENCOUNTER — Other Ambulatory Visit: Payer: Self-pay

## 2020-09-04 ENCOUNTER — Telehealth (INDEPENDENT_AMBULATORY_CARE_PROVIDER_SITE_OTHER): Payer: Medicaid Other | Admitting: Psychiatry

## 2020-09-04 VITALS — Wt 229.0 lb

## 2020-09-04 DIAGNOSIS — F3160 Bipolar disorder, current episode mixed, unspecified: Secondary | ICD-10-CM

## 2020-09-04 DIAGNOSIS — F419 Anxiety disorder, unspecified: Secondary | ICD-10-CM | POA: Diagnosis not present

## 2020-09-04 MED ORDER — LURASIDONE HCL 80 MG PO TABS: 80.0000 mg | ORAL_TABLET | Freq: Every day | ORAL | 2 refills | Status: DC

## 2020-09-04 MED ORDER — HYDROXYZINE PAMOATE 50 MG PO CAPS
50.0000 mg | ORAL_CAPSULE | Freq: Every day | ORAL | 2 refills | Status: DC
Start: 2020-09-04 — End: 2020-09-19

## 2020-09-04 NOTE — Progress Notes (Signed)
Virtual Visit via Telephone Note  I connected with [place] on 09/04/20 at  3:20 PM EDT by telephone and verified that I am speaking with the correct person using two identifiers.  Location: Patient: Home Provider: Home Office   I discussed the limitations, risks, security and privacy concerns of performing an evaluation and management service by telephone and the availability of in person appointments. I also discussed with the patient that there may be a patient responsible charge related to this service. The patient expressed understanding and agreed to proceed.   History of Present Illness: Patient is evaluated by phone session.  She is not taking BuSpar because she felt in the beginning very dizzy and then it make her hyper and she could not sleep.  She is taking hydroxyzine which is helping her sleep and anxiety.  She admitted there are days when she still feels irritable, angry, manic and having mood symptoms.  She like to go up on Latuda because she noticed it is helping but does not last long.  She is now taking metformin 500 mg in the morning and thousand at bedtime prescribed by Dr. Kathyrn Lass.  Her last hemoglobin A1c was 9.1 but she is having a blood work very soon and hoping it will drop.  She lost 4 pounds.  He denies any suicidal thoughts or homicidal thought.  She has no tremors, rash or any itching.  She reported things are going very well at home.  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  She had a better relationship with her husband.  She denies any major panic attack.  Past Psychiatric History: H/Odepression, mood swings, irritability, maniaand impulsive behavior. PCP prescribed Effexor in 2012 worked for a while. Tried Lamictal (hallucination), Buspar (groggy).H/OETOH and THC but sober since 2009. No h/oinpatient or any suicidal attempt.   Psychiatric Specialty Exam: Physical Exam  Review of Systems  Weight 229 lb (103.9 kg).There is no  height or weight on file to calculate BMI.  General Appearance: NA  Eye Contact:  NA  Speech:  Normal Rate  Volume:  Normal  Mood:  Anxious and Irritable  Affect:  NA  Thought Process:  Descriptions of Associations: Intact  Orientation:  Full (Time, Place, and Person)  Thought Content:  Rumination  Suicidal Thoughts:  No  Homicidal Thoughts:  No  Memory:  Immediate;   Good Recent;   Good Remote;   Good  Judgement:  Intact  Insight:  Shallow  Psychomotor Activity:  NA  Concentration:  Concentration: Fair and Attention Span: Fair  Recall:  Good  Fund of Knowledge:  Good  Language:  Good  Akathisia:  No  Handed:  Right  AIMS (if indicated):     Assets:  Communication Skills Desire for Improvement Resilience Transportation  ADL's:  Intact  Cognition:  WNL  Sleep:   fair      Assessment and Plan: Discontinue BuSpar.  She like to go up on Latuda and we will try 80 mg to help her mood lability.  Continue hydroxyzine 50 mg at bedtime.  Her last hemoglobin A1c was 9.4 and she is trying to lose weight and metformin dose was increased and she is going to have another blood work very soon.  Discussed healthy lifestyle and watch her calorie intake.  Encourage regular walking.  Patient not seeing Leeann anymore because she does not feel connected with her.  She is not interested in therapy at this time.  Recommended to call us  back if she has any question or any concern.  Follow-up in 3 months.  Follow Up Instructions:    I discussed the assessment and treatment plan with the patient. The patient was provided an opportunity to ask questions and all were answered. The patient agreed with the plan and demonstrated an understanding of the instructions.   The patient was advised to call back or seek an in-person evaluation if the symptoms worsen or if the condition fails to improve as anticipated.  I provided 18 minutes of non-face-to-face time during this encounter.   [name],  MD

## 2020-09-19 ENCOUNTER — Other Ambulatory Visit (HOSPITAL_COMMUNITY): Payer: Self-pay | Admitting: Psychiatry

## 2020-09-19 DIAGNOSIS — F419 Anxiety disorder, unspecified: Secondary | ICD-10-CM

## 2020-10-22 ENCOUNTER — Other Ambulatory Visit (HOSPITAL_COMMUNITY): Payer: Self-pay | Admitting: *Deleted

## 2020-10-22 DIAGNOSIS — F3160 Bipolar disorder, current episode mixed, unspecified: Secondary | ICD-10-CM

## 2020-10-22 MED ORDER — LURASIDONE HCL 80 MG PO TABS: 80.0000 mg | ORAL_TABLET | Freq: Every day | ORAL | 1 refills | Status: DC

## 2020-12-04 ENCOUNTER — Other Ambulatory Visit: Payer: Self-pay

## 2020-12-04 ENCOUNTER — Telehealth (INDEPENDENT_AMBULATORY_CARE_PROVIDER_SITE_OTHER): Payer: Medicaid Other | Admitting: Psychiatry

## 2020-12-04 ENCOUNTER — Encounter (HOSPITAL_COMMUNITY): Payer: Self-pay | Admitting: Psychiatry

## 2020-12-04 DIAGNOSIS — F3160 Bipolar disorder, current episode mixed, unspecified: Secondary | ICD-10-CM | POA: Diagnosis not present

## 2020-12-04 DIAGNOSIS — F419 Anxiety disorder, unspecified: Secondary | ICD-10-CM

## 2020-12-04 MED ORDER — HYDROXYZINE PAMOATE 50 MG PO CAPS
50.0000 mg | ORAL_CAPSULE | Freq: Every day | ORAL | 2 refills | Status: DC
Start: 2020-12-04 — End: 2021-03-05

## 2020-12-04 MED ORDER — LURASIDONE HCL 80 MG PO TABS: 80.0000 mg | ORAL_TABLET | Freq: Every day | ORAL | 2 refills | Status: DC

## 2020-12-04 NOTE — Progress Notes (Signed)
Virtual Visit via Telephone Note  I connected with Vicki Huynh on 12/04/20 at  2:40 PM EDT by telephone and verified that I am speaking with the correct person using two identifiers.  Location: Patient: Home Provider: Home office   I discussed the limitations, risks, security and privacy concerns of performing an evaluation and management service by telephone and the availability of in person appointments. I also discussed with the patient that there may be a patient responsible charge related to this service. The patient expressed understanding and agreed to proceed.   History of Present Illness: Patient is evaluated by phone session.  On the last visit we increased Latuda 80 mg and she is doing much better.  She denies any irritability, anger, mania or any highs or lows.  She also pleased that her recent hemoglobin A1c dropped from 9.1-7.8.  She also lost few pounds since the last visit.  Her PCP Dr. Kathyrn Lass suggested to have sleep study as patient is still struggle with frequent awakening.  Today she is having sleep study.  She is also thinking about having hysterectomy due to having some concerns and issues.  Overall she feels things are going very well.  She had a good support from her husband.  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  She denies any panic attack, anger, mood swings, mania or any psychosis.  She denies any suicidal thoughts or any hallucination.  She like to keep the current medication.  She has no tremors, shakes or any EPS.  She is no longer taking BuSpar which causes dizziness.   Past Psychiatric History:  H/O depression, mood swings, irritability, mania and impulsive behavior.  PCP prescribed Effexor in 2012 worked for a while. Tried Lamictal (hallucination), Buspar (groggy) . H/O ETOH and THC but sober since 2009.  No h/o inpatient or any suicidal attempt.    Psychiatric Specialty Exam: Physical Exam  Review of Systems  Weight 227 lb (103 kg).There is no  height or weight on file to calculate BMI.  General Appearance: NA  Eye Contact:  NA  Speech:  Clear and Coherent  Volume:  Normal  Mood:  Euthymic  Affect:  NA  Thought Process:  Goal Directed  Orientation:  Full (Time, Place, and Person)  Thought Content:  WDL  Suicidal Thoughts:  No  Homicidal Thoughts:  No  Memory:  Immediate;   Good Recent;   Good Remote;   Good  Judgement:  Intact  Insight:  Present  Psychomotor Activity:  NA  Concentration:  Concentration: Good and Attention Span: Good  Recall:  Good  Fund of Knowledge:  Good  Language:  Good  Akathisia:  No  Handed:  Right  AIMS (if indicated):     Assets:  Communication Skills Desire for Improvement Housing Resilience Social Support Transportation  ADL's:  Intact  Cognition:  WNL  Sleep:   fair      Assessment and Plan: Patient doing well with increased dose of Latuda 80 mg daily.  Patient prefers monthly refills that she cannot afford 90-day supply.  She would like to have a hydroxyzine 50 mg which she is taking every night.  Reviewed blood work results.  Her hemoglobin A1c is improved from the past.  Patient also going to have sleep study tonight and also thinking about getting hysterectomy due to having symptoms.  Patient is not seeing therapist.  She does not want to change the medication as things are going well.  We will continue  Latuda 80 mg daily and hydroxyzine 50 mg at bedtime.  Recommended to call us back if she has any question or any concern.  Follow-up in 3 months.  Follow Up Instructions:    I discussed the assessment and treatment plan with the patient. The patient was provided an opportunity to ask questions and all were answered. The patient agreed with the plan and demonstrated an understanding of the instructions.   The patient was advised to call back or seek an in-person evaluation if the symptoms worsen or if the condition fails to improve as anticipated.  I provided 12 minutes of  non-face-to-face time during this encounter.   [name], MD

## 2020-12-05 ENCOUNTER — Telehealth (HOSPITAL_COMMUNITY): Payer: Medicaid Other | Admitting: Psychiatry

## 2020-12-27 ENCOUNTER — Emergency Department (HOSPITAL_COMMUNITY): Payer: Medicaid Other

## 2020-12-27 ENCOUNTER — Emergency Department (HOSPITAL_COMMUNITY)
Admission: EM | Admit: 2020-12-27 | Discharge: 2020-12-27 | Disposition: A | Discharge status: Home / Self Care | Payer: Medicaid Other | Attending: Emergency Medicine | Admitting: Emergency Medicine

## 2020-12-27 ENCOUNTER — Encounter (HOSPITAL_COMMUNITY): Payer: Self-pay

## 2020-12-27 ENCOUNTER — Other Ambulatory Visit: Payer: Self-pay

## 2020-12-27 DIAGNOSIS — K5900 Constipation, unspecified: Secondary | ICD-10-CM | POA: Insufficient documentation

## 2020-12-27 DIAGNOSIS — R1010 Upper abdominal pain, unspecified: Secondary | ICD-10-CM | POA: Diagnosis present

## 2020-12-27 DIAGNOSIS — R1084 Generalized abdominal pain: Secondary | ICD-10-CM | POA: Diagnosis not present

## 2020-12-27 LAB — URINALYSIS, ROUTINE W REFLEX MICROSCOPIC
Bilirubin Urine: NEGATIVE
Glucose, UA: NEGATIVE mg/dL
Ketones, ur: 20 mg/dL — AB
Leukocytes,Ua: NEGATIVE
Nitrite: NEGATIVE
Protein, ur: NEGATIVE mg/dL
Specific Gravity, Urine: 1.03 (ref 1.005–1.030)
pH: 6 (ref 5.0–8.0)

## 2020-12-27 LAB — CBC
HCT: 39.1 % (ref 36.0–46.0)
Hemoglobin: 13.2 g/dL (ref 12.0–15.0)
MCH: 28.7 pg (ref 26.0–34.0)
MCHC: 33.8 g/dL (ref 30.0–36.0)
MCV: 85 fL (ref 80.0–100.0)
Platelets: 323 10*3/uL (ref 150–400)
RBC: 4.6 MIL/uL (ref 3.87–5.11)
RDW: 12.5 % (ref 11.5–15.5)
WBC: 9.7 10*3/uL (ref 4.0–10.5)
nRBC: 0 % (ref 0.0–0.2)

## 2020-12-27 LAB — COMPREHENSIVE METABOLIC PANEL
ALT: 38 U/L (ref 0–44)
AST: 18 U/L (ref 15–41)
Albumin: 4.8 g/dL (ref 3.5–5.0)
Alkaline Phosphatase: 59 U/L (ref 38–126)
Anion gap: 11 (ref 5–15)
BUN: 24 mg/dL — ABNORMAL HIGH (ref 6–20)
CO2: 24 mmol/L (ref 22–32)
Calcium: 9.7 mg/dL (ref 8.9–10.3)
Chloride: 104 mmol/L (ref 98–111)
Creatinine, Ser: 0.86 mg/dL (ref 0.44–1.00)
GFR, Estimated: >60 mL/min (ref >60)
Glucose, Bld: 148 mg/dL — ABNORMAL HIGH (ref 70–99)
Potassium: 3.8 mmol/L (ref 3.5–5.1)
Sodium: 139 mmol/L (ref 135–145)
Total Bilirubin: 0.9 mg/dL (ref 0.3–1.2)
Total Protein: 8 g/dL (ref 6.5–8.1)

## 2020-12-27 LAB — I-STAT BETA HCG BLOOD, ED (MC, WL, AP ONLY): I-stat hCG, quantitative: <5 m[IU]/mL (ref <5)

## 2020-12-27 LAB — LIPASE, BLOOD: Lipase: 32 U/L (ref 11–51)

## 2020-12-27 MED ORDER — FENTANYL CITRATE PF 50 MCG/ML IJ SOSY
50.0000 ug | PREFILLED_SYRINGE | Freq: Once | INTRAMUSCULAR | Status: AC
Start: 2020-12-27 — End: 2020-12-27
  Administered 2020-12-27: 50 ug via INTRAVENOUS
  Filled 2020-12-27: qty 1

## 2020-12-27 MED ORDER — SODIUM CHLORIDE 0.9 % IV SOLN: INTRAVENOUS | Status: DC

## 2020-12-27 MED ORDER — IOHEXOL 350 MG/ML SOLN
80.0000 mL | Freq: Once | INTRAVENOUS | Status: AC | PRN
  Administered 2020-12-27: 80 mL via INTRAVENOUS

## 2020-12-27 MED ORDER — SODIUM CHLORIDE 0.9 % IV BOLUS
1000.0000 mL | Freq: Once | INTRAVENOUS | Status: AC
  Administered 2020-12-27: 1000 mL via INTRAVENOUS

## 2020-12-27 NOTE — ED Triage Notes (Signed)
Patient reports that she has mid abdominal pain since yesterday. Patient thought she was having "period cramping, but much worse." Patient states the pain radiates into right lower back at times. Patient c/o nausea, but denies V/D.

## 2020-12-27 NOTE — Discharge Instructions (Signed)
The testing today is reassuring.  There are no signs of serious problems.  You may have some bowel disability related to constipation.  If your pain is ongoing, try using MiraLAX once or twice a day to improve your stooling.  Also make sure you drink plenty of water, 1 to 2 quarts each day.  Additionally increasing fiber in your diet sometimes fixes these kinds of problems.

## 2020-12-27 NOTE — ED Provider Notes (Signed)
[facility] DEPT Provider Note   CSN: [id number] Arrival date & time: 12/27/20  1017     History Chief Complaint  Patient presents with   Abdominal Pain    Vicki Huynh is a 40 y.o. female.  HPI She presents for evaluation of abdominal pain, upper which started last night and is different from her chronic lower abdominal pain.  She is due to have a hysterectomy next month because of metromenorrhagia.  She denies fever, chills, vomiting, dizziness, weakness, paresthesia, cough, shortness of breath or chest pain.  She has not tried to take anything for this pain and did not eat today.  There are no other known active modifying factors.    Past Medical History:  Diagnosis Date   Anxiety    Bipolar depression ([place])    Depression    Diabetic mellitus, gestational     Patient Active Problem List   Diagnosis Date Noted   Menorrhagia with regular cycle 10/09/2014   Dysmenorrhea 10/09/2014   Pelvic pain in female 10/09/2014   Ulnar neuropathy 02/06/2014   Pain in joint, upper arm 02/06/2014   Paresthesias 02/06/2014    Past Surgical History:  Procedure Laterality Date   CHOLECYSTECTOMY     HYSTEROSCOPY WITH NOVASURE N/A 10/09/2014   Procedure: HYSTEROSCOPY WITH NOVASURE;  Surgeon: Cheri Fowler, MD;  Location: [facility];  Service: Gynecology;  Laterality: N/A;   LAPAROSCOPIC BILATERAL SALPINGECTOMY Bilateral 10/09/2014   Procedure: LAPAROSCOPIC BILATERAL SALPINGECTOMY with removal of Essure coils;  Surgeon: Cheri Fowler, MD;  Location: [facility];  Service: Gynecology;  Laterality: Bilateral;  1hr OR time   WISDOM TOOTH EXTRACTION       OB History     Gravida  4   Para  2   Term  2   Preterm      AB  2   Living  2      SAB  1   IAB  1   Ectopic      Multiple      Live Births              Family History  Problem Relation Age of Onset   Rheum arthritis Mother    Rheum arthritis Father    Alcohol abuse Father         in recovery   Depression Father    Heart attack Father    Drug abuse Brother     Social History   Tobacco Use   Smoking status: Never   Smokeless tobacco: Never  Vaping Use   Vaping Use: Never used  Substance Use Topics   Alcohol use: No    Alcohol/week: 0.0 standard drinks   Drug use: No    Home Medications Prior to Admission medications   Medication Sig Start Date End Date Taking? Authorizing Provider  ACCU-CHEK FASTCLIX LANCETS [place]  06/20/17   [provider]  ACCU-CHEK GUIDE test strip  06/20/17   [provider]  bismuth subsalicylate (PEPTO BISMOL) 262 MG chewable tablet Chew 524 mg by mouth daily as needed for indigestion.    [provider]  busPIRone (BUSPAR) 5 MG tablet Take 1 tablet (5 mg total) by mouth 2 (two) times daily. Patient not taking: Reported on 09/04/2020 06/04/20 06/04/21  Arfeen, Arlyce Harman, MD  glimepiride (AMARYL) 4 MG tablet Take 4 mg by mouth daily with breakfast.    Kathyrn Lass, MD  hydrOXYzine (VISTARIL) 50 MG capsule Take 1 capsule (50 mg total) by mouth  at bedtime. 12/04/20   Arfeen, Arlyce Harman, MD  losartan (COZAAR) 25 MG tablet Take 25 mg by mouth every evening.  12/16/17   [provider]  lurasidone (LATUDA) 80 MG TABS tablet Take 1 tablet (80 mg total) by mouth daily with breakfast. 12/04/20   Arfeen, Arlyce Harman, MD  metFORMIN (GLUCOPHAGE-XR) 500 MG 24 hr tablet 500 mg. One in am and 1000 mg at bedtime 07/23/20   Kathyrn Lass, MD  pantoprazole (PROTONIX) 20 MG tablet Take 20 mg by mouth daily.  11/30/18   [provider]  rosuvastatin (CRESTOR) 5 MG tablet Take 5 mg by mouth every other day. 12/28/17   [provider]  sucralfate (CARAFATE) 1 g tablet Take 1 tablet (1 g total) by mouth 4 (four) times daily -  with meals and at bedtime. 12/28/19   Montine Circle, PA-C    Allergies    Lamictal [lamotrigine], Topamax [topiramate], Nickel, and Wool alcohol [lanolin alcohol]  Review of Systems   Review of Systems   All other systems reviewed and are negative.  Physical Exam Updated Vital Signs BP 110/75   Pulse 77   Temp 98.5 F (36.9 C) (Oral)   Resp 17   Ht 5\' 7"  (1.702 m)   Wt 101.2 kg   LMP 12/27/2020   SpO2 98%   BMI 34.93 kg/m   Physical Exam Vitals and nursing note reviewed.  Constitutional:      General: She is not in acute distress.    Appearance: She is well-developed. She is obese. She is not ill-appearing or diaphoretic.  HENT:     Head: Normocephalic and atraumatic.     Right Ear: External ear normal.     Left Ear: External ear normal.  Eyes:     Conjunctiva/sclera: Conjunctivae normal.     Pupils: Pupils are equal, round, and reactive to light.  Neck:     Trachea: Phonation normal.  Cardiovascular:     Rate and Rhythm: Normal rate and regular rhythm.     Heart sounds: Normal heart sounds.  Pulmonary:     Effort: Pulmonary effort is normal.     Breath sounds: Normal breath sounds.  Abdominal:     General: There is no distension.     Palpations: Abdomen is soft. There is no mass.     Tenderness: There is abdominal tenderness (Mid upper, mild). There is no guarding.     Hernia: No hernia is present.  Musculoskeletal:        General: Normal range of motion.     Cervical back: Normal range of motion and neck supple.  Skin:    General: Skin is warm and dry.  Neurological:     Mental Status: She is alert and oriented to person, place, and time.     Cranial Nerves: No cranial nerve deficit.     Sensory: No sensory deficit.     Motor: No abnormal muscle tone.     Coordination: Coordination normal.  Psychiatric:        Mood and Affect: Mood normal.        Behavior: Behavior normal.        Thought Content: Thought content normal.        Judgment: Judgment normal.    ED Results / Procedures / Treatments   Labs (all labs ordered are listed, but only abnormal results are displayed) Labs Reviewed  COMPREHENSIVE METABOLIC PANEL - Abnormal; Notable for the following  components:      Result  Value   Glucose, Bld 148 (*)    BUN 24 (*)    All other components within normal limits  URINALYSIS, ROUTINE W REFLEX MICROSCOPIC - Abnormal; Notable for the following components:   Hgb urine dipstick LARGE (*)    Ketones, ur 20 (*)    Bacteria, UA RARE (*)    All other components within normal limits  LIPASE, BLOOD  CBC  I-STAT BETA HCG BLOOD, ED ([facility], [facility], [facility] ONLY)    EKG None  Radiology CT Abdomen Pelvis W Contrast  Result Date: 12/27/2020 CLINICAL DATA:  Mid abdominal pain since yesterday. EXAM: CT ABDOMEN AND PELVIS WITH CONTRAST TECHNIQUE: Multidetector CT imaging of the abdomen and pelvis was performed using the standard protocol following bolus administration of intravenous contrast. CONTRAST:  26mL OMNIPAQUE IOHEXOL 350 MG/ML SOLN COMPARISON:  07/06/2019 FINDINGS: Lower chest: Clear lung bases. Normal heart size without pericardial or pleural effusion. Hepatobiliary: Suspicion of mild hepatic steatosis with hepatomegaly at 21.4 cm craniocaudal. Cholecystectomy, without biliary ductal dilatation. Pancreas: Normal, without mass or ductal dilatation. Spleen: Normal in size, without focal abnormality. Adrenals/Urinary Tract: Normal adrenal glands. Punctate lower pole left renal collecting system calculus. Too small to characterize lower pole left renal lesion. Normal right kidney. Normal urinary bladder. Stomach/Bowel: Normal stomach, without wall thickening. Colonic stool burden suggests constipation. Normal terminal ileum and appendix. Normal small bowel. Vascular/Lymphatic: Normal caliber of the aorta and branch vessels. No abdominopelvic adenopathy. Reproductive: Normal ureters and urinary bladder. Other: No significant free fluid.  No free intraperitoneal air. Musculoskeletal: No acute osseous abnormality. IMPRESSION: 1. No acute process in the abdomen or pelvis. 2. Possible constipation. 3. Left nephrolithiasis. 4. Hepatomegaly and probable hepatic steatosis.  Electronically Signed   By: Abigail Miyamoto M.D.   On: 12/27/2020 14:21    Procedures Procedures   Medications Ordered in ED Medications  0.9 %  sodium chloride infusion (0 mLs Intravenous Stopped 12/27/20 1458)  sodium chloride 0.9 % bolus 1,000 mL (0 mLs Intravenous Stopped 12/27/20 1437)  fentaNYL (SUBLIMAZE) injection 50 mcg (50 mcg Intravenous Given 12/27/20 1254)  iohexol (OMNIPAQUE) 350 MG/ML injection 80 mL (80 mLs Intravenous Contrast Given 12/27/20 1342)    ED Course  I have reviewed the triage vital signs and the nursing notes.  Pertinent labs & imaging results that were available during my care of the patient were reviewed by me and considered in my medical decision making (see chart for details).    MDM Rules/Calculators/A&P                            Patient Vitals for the past 24 hrs:  BP Temp Temp src Pulse Resp SpO2 Height Weight  12/27/20 1400 110/75 -- -- 77 17 98 % -- --  12/27/20 1300 108/88 -- -- 84 18 96 % -- --  12/27/20 1230 115/77 -- -- 78 18 96 % -- --  12/27/20 1151 109/80 -- -- 82 18 97 % -- --  12/27/20 1029 -- -- -- -- -- -- 5\' 7"  (1.702 m) 101.2 kg  12/27/20 1025 (!) 145/94 98.5 F (36.9 C) Oral 98 18 98 % -- --    At the time of discharge- reevaluation with update and discussion. After initial assessment and treatment, an updated evaluation reveals she is comfortable and has no further complaints.  Findings discussed and questions were answered. Vicki Huynh   Medical Decision Making:  This patient is presenting for evaluation of abdominal pain,  which does require a range of treatment options, and is a complaint that involves a moderate risk of morbidity and mortality. The differential diagnoses include pelvic disorder, intestinal disorder, nonspecific pain. I decided to review old records, and in summary obese female, with history of menorrhagia, planned hysterectomy soon,who has previously had her gallbladder removed..  I did not require  additional historical information from anyone.  Clinical Laboratory Tests Ordered, included CBC, Metabolic panel, Urinalysis, and lipase, pregnancy test . Review indicates normal except clean-catch urine with blood and ketones, white cells and rare bacteria; glucose high, BUN high. Radiologic Tests Ordered, included CT abdomen pelvis.  I independently Visualized: Radiographic images, which show no acute abnormalities   Critical Interventions-clinical evaluation, laboratory testing, radiography, observation and reassessment  After These Interventions, the Patient was reevaluated and was found comfortable stable for discharge.  CT indicates possible constipation.  No signs of serious intra-abdominal infection, intestinal blockage or unsuspected pelvic disorders.  CRITICAL CARE-no Performed by: Vicki Huynh  Nursing Notes Reviewed/ Care Coordinated Applicable Imaging Reviewed Interpretation of Laboratory Data incorporated into ED treatment  The patient appears reasonably screened and/or stabilized for discharge and I doubt any other medical condition or other [facility] requiring further screening, evaluation, or treatment in the ED at this time prior to discharge.  Plan: Home Medications-continue usual, consider using MiraLAX for constipation; Home Treatments-high-fiber diet; return here if the recommended treatment, does not improve the symptoms; Recommended follow up-PCP, as needed     Final Clinical Impression(s) / ED Diagnoses Final diagnoses:  Generalized abdominal pain  Constipation, unspecified constipation type    Rx / DC Orders ED Discharge Orders     None        Vicki Bo, MD 12/27/20 1649

## 2021-01-07 ENCOUNTER — Other Ambulatory Visit: Payer: Self-pay

## 2021-01-07 ENCOUNTER — Encounter (HOSPITAL_BASED_OUTPATIENT_CLINIC_OR_DEPARTMENT_OTHER): Payer: Self-pay | Admitting: Obstetrics and Gynecology

## 2021-01-07 DIAGNOSIS — Z01818 Encounter for other preprocedural examination: Secondary | ICD-10-CM | POA: Diagnosis present

## 2021-01-07 NOTE — Progress Notes (Signed)
Spoke w/ via phone for pre-op interview---pt Lab needs dos----  urine poct             Lab results------lab appt 01-11-2021 1300 for cbc bmp t & s and ekg COVID test -----01-11-2021 overnight stay Arrive 915  am 01-15-2021 NPO after MN NO Solid Food.  Clear liquids from MN until---815 am Med rec completed Medications to take morning of surgery -----none Diabetic medication -----none day of surgery Patient instructed no nail polish to be worn day of surgery Patient instructed to bring photo id and insurance card day of surgery Patient aware to have Driver (ride ) / caregiver    for 24 hours after surgery  father trey Patient Special Instructions -----pt given overnight stay instructions Pre-Op special Istructions -----note to dr Landry Mellow epic ib for surgery orders Patient verbalized understanding of instructions that were given at this phone interview. Patient denies shortness of breath, chest pain, fever, cough at this phone interview.

## 2021-01-07 NOTE — Progress Notes (Signed)
YOU ARE SCHEDULED FOR A COVID TEST ON   10-11-2020 . THIS TEST MUST BE DONE BEFORE SURGERY. GO TO  [facility] @ [place]   PHONE NUMBER [phone].   TURN LEFT AT THE SHIPPING AND RECEIVING SIGN AND LOOK FOR SMALL BLUE POP UP TENT UNDER BUILDING OVERHANG AT BACK OF BUILDING AND REMAIN IN YOUR CAR, THIS IS A DRIVE UP TEST.  AFTER YOUR COVID TEST , PLEASE WEAR A MASK OUT IN PUBLIC AND SOCIAL DISTANCE AND [place] YOUR HANDS FREQUENTLY. PLEASE ASK ALL YOUR CLOSE HOUSEHOLD CONTACT TO WEAR MASK OUT IN PUBLIC AND SOCIAL DISTANCE AND [place] HANDS FREQUENTLY ALSO.      Your procedure is scheduled on  01-15-2021  Report to [facility]. M.   Call this number if you have problems the morning of surgery  [phone].   OUR ADDRESS IS [address].  WE ARE LOCATED IN [facility].  PLEASE BRING YOUR INSURANCE CARD AND PHOTO ID DAY OF SURGERY.  ONLY ONE PERSON ALLOWED IN FACILITY WAITING AREA.                                     REMEMBER:  DO NOT EAT FOOD, CANDY GUM OR MINTS  AFTER MIDNIGHT . YOU MAY HAVE CLEAR LIQUIDS FROM MIDNIGHT UNTIL 815 AM. NO CLEAR LIQUIDS AFTER  815 AM DAY OF SURGERY.   YOU MAY  BRUSH YOUR TEETH MORNING OF SURGERY AND RINSE YOUR MOUTH OUT, NO CHEWING GUM CANDY OR MINTS.    CLEAR LIQUID DIET   Foods Allowed                                                                     Foods Excluded  Coffee and tea, regular and decaf                             liquids that you cannot  Plain Jell-O any favor except red or purple                                           see through such as: Fruit ices (not with fruit pulp)                                     milk, soups, orange juice  Iced Popsicles                                    All solid food Carbonated beverages, regular and diet                                    Cranberry, grape and apple juices Sports drinks like Gatorade Lightly  seasoned clear  broth or consume(fat free) Sugar  Sample Menu Breakfast                                Lunch                                     Supper Cranberry juice                    Beef broth                            Chicken broth Jell-O                                     Grape juice                           Apple juice Coffee or tea                        Jell-O                                      Popsicle                                                Coffee or tea                        Coffee or tea  _____________________________________________________________________     TAKE THESE MEDICATIONS MORNING OF SURGERY WITH A SIP OF WATER: NONE  ONE VISITOR IS ALLOWED IN WAITING ROOM ONLY DAY OF SURGERY.  YOU MAY HAVE ANOTHER PERSON SWITCH OUT WITH THE  1  VISITOR IN THE WAITING ROOM DAY OF SURGERY AND A MASK MUST BE WORN IN THE WAITING ROOM.    2 VISITORS  MAY VISIT IN THE EXTENDED RECOVERY ROOM UNTIL 800 PM ONLY 1 VISITOR AGE 65 AND OVER MAY SPEND THE NIGHT AND MUST BE IN EXTENDED RECOVERY ROOM NO LATER THAN 800 PM .   UP TO 2 CHILDREN AGE 6 TO 15 MAY ALSO VISIT IN EXTENDED RECOVERY ROOM ONLY UNTIL 800 PM AND MUST LEAVE BY 800 PM. ALL PERSONS VISITING IN EXTENDED RECOVERY ROOM MUST WEAR A MASK.                                    DO NOT WEAR JEWERLY, MAKE UP. DO NOT WEAR LOTIONS, POWDERS, PERFUMES OR NAIL POLISH. DO NOT SHAVE FOR 48 HOURS PRIOR TO DAY OF SURGERY. MEN MAY SHAVE FACE AND NECK. CONTACTS, GLASSES, OR DENTURES MAY NOT BE WORN TO SURGERY.                                    [facility] IS NOT RESPONSIBLE  FOR ANY BELONGINGS.                                                                    Marland Kitchen  [facility] - Preparing for Surgery Before surgery, you can play an important role.  Because skin is not sterile, your skin needs to be as free of germs as possible.  You can reduce the number of germs on your skin by washing with CHG (chlorahexidine gluconate) soap  before surgery.  CHG is an antiseptic cleaner which kills germs and bonds with the skin to continue killing germs even after washing. Please DO NOT use if you have an allergy to CHG or antibacterial soaps.  If your skin becomes reddened/irritated stop using the CHG and inform your nurse when you arrive at Short Stay. Do not shave (including legs and underarms) for at least 48 hours prior to the first CHG shower.  You may shave your face/neck. Please follow these instructions carefully:  1.  Shower with CHG Soap the night before surgery and the  morning of Surgery.  2.  If you choose to wash your hair, wash your hair first as usual with your  normal  shampoo.  3.  After you shampoo, rinse your hair and body thoroughly to remove the  shampoo.                            4.  Use CHG as you would any other liquid soap.  You can apply chg directly  to the skin and wash                      Gently with a scrungie or clean washcloth.  5.  Apply the CHG Soap to your body ONLY FROM THE NECK DOWN.   Do not use on face/ open                           Wound or open sores. Avoid contact with eyes, ears mouth and genitals (private parts).                       Wash face,  Genitals (private parts) with your normal soap.             6.  Wash thoroughly, paying special attention to the area where your surgery  will be performed.  7.  Thoroughly rinse your body with warm water from the neck down.  8.  DO NOT shower/wash with your normal soap after using and rinsing off  the CHG Soap.                9.  Pat yourself dry with a clean towel.            10.  Wear clean pajamas.            11.  Place clean sheets on your bed the night of your first shower and do not  sleep with pets. Day of Surgery : Do not apply any lotions/deodorants the morning of surgery.  Please wear clean clothes to the hospital/surgery center.  FAILURE TO FOLLOW THESE INSTRUCTIONS MAY RESULT IN THE CANCELLATION OF YOUR SURGERY PATIENT  SIGNATURE_________________________________  NURSE SIGNATURE__________________________________  ________________________________________________________________________                                                        QUESTIONS Vicki Huynh  Vicki Huynh PRE OP NURSE PHONE [phone].

## 2021-01-10 ENCOUNTER — Encounter (HOSPITAL_COMMUNITY): Payer: Medicaid Other

## 2021-01-11 ENCOUNTER — Encounter (HOSPITAL_COMMUNITY)
Admission: RE | Admit: 2021-01-11 | Discharge: 2021-01-11 | Disposition: A | Discharge status: Home / Self Care | Payer: Medicaid Other | Source: Ambulatory Visit | Attending: Obstetrics and Gynecology | Admitting: Obstetrics and Gynecology

## 2021-01-11 ENCOUNTER — Other Ambulatory Visit: Payer: Self-pay | Admitting: Obstetrics and Gynecology

## 2021-01-11 ENCOUNTER — Other Ambulatory Visit: Payer: Self-pay

## 2021-01-11 DIAGNOSIS — Z01818 Encounter for other preprocedural examination: Secondary | ICD-10-CM | POA: Diagnosis not present

## 2021-01-11 LAB — CBC
HCT: 37.9 % (ref 36.0–46.0)
Hemoglobin: 13.1 g/dL (ref 12.0–15.0)
MCH: 29 pg (ref 26.0–34.0)
MCHC: 34.6 g/dL (ref 30.0–36.0)
MCV: 84 fL (ref 80.0–100.0)
Platelets: 354 10*3/uL (ref 150–400)
RBC: 4.51 MIL/uL (ref 3.87–5.11)
RDW: 12.6 % (ref 11.5–15.5)
WBC: 7.8 10*3/uL (ref 4.0–10.5)
nRBC: 0 % (ref 0.0–0.2)

## 2021-01-11 LAB — BASIC METABOLIC PANEL
Anion gap: 9 (ref 5–15)
BUN: 14 mg/dL (ref 6–20)
CO2: 25 mmol/L (ref 22–32)
Calcium: 8.9 mg/dL (ref 8.9–10.3)
Chloride: 103 mmol/L (ref 98–111)
Creatinine, Ser: 0.83 mg/dL (ref 0.44–1.00)
GFR, Estimated: >60 mL/min (ref >60)
Glucose, Bld: 121 mg/dL — ABNORMAL HIGH (ref 70–99)
Potassium: 3.4 mmol/L — ABNORMAL LOW (ref 3.5–5.1)
Sodium: 137 mmol/L (ref 135–145)

## 2021-01-12 ENCOUNTER — Other Ambulatory Visit (HOSPITAL_COMMUNITY): Payer: Self-pay | Admitting: Psychiatry

## 2021-01-12 DIAGNOSIS — F419 Anxiety disorder, unspecified: Secondary | ICD-10-CM

## 2021-01-12 LAB — SARS CORONAVIRUS 2 (TAT 6-24 HRS): SARS Coronavirus 2: NEGATIVE

## 2021-01-14 ENCOUNTER — Other Ambulatory Visit: Payer: Self-pay | Admitting: Obstetrics and Gynecology

## 2021-01-14 DIAGNOSIS — N939 Abnormal uterine and vaginal bleeding, unspecified: Secondary | ICD-10-CM

## 2021-01-14 NOTE — H&P (Signed)
Chief Complaint(s):      Preop visit: abnormal uterine bleeding and dysmenorrhea.       HPI:          Isolation Precautions          Has patient received COVID-19 vaccination?  No.  Does patient report new onset of COVID symptoms?  No.  Has patient or close contact tested positive for COVID-19?  No , not in the past 2 weeks.         General          40 yo presents for a preop visit for robotic-assisted laparoscopic hysterectomy with bilateral salpingectomy for management of abnormal uterine bleeding and dysmenorrhea. Her surgery is scheduled 01/15/2021 at 11 am.             Her history is significant for endometrial ablation in 2011 or 2013 w/ Essure.             She later had Essure removed in 2016 along w/ portions of her fallopian tubes.             Labs on Jun 11, 2020 revealed TSH, Prolactin, CBC w/ diff WNL.             U/S at [facility] on Jun 15, 2020 revealed uterus measuring 9.0 x 4.4 x 5.2 cm. Endometrium thin at 2.0 mm. Complex cystic collections at and adjacent to endometrial complex at upper uterus favor prior endometrial ablation. Tiny subendometrial cysts at lower uterine segment and cervix. No focal uterine mass. Nabothian cyst at cervix. Bilat OV WNL.             Pt was seen by NP Burman Riis on Jul 19, 2020 c/o having 3 periods 2 weeks apart for the last 2 months. She reported needing to use a tampon instead of her usual liner for bleeding. Prior to this, she had monthly menses w/ light flow. Endorsed increasing dysmenorrhea (managed by heating pad). Ibuprofen 800 MG not effective.             Pt was seen in the office 07/2020 for a surgical consultation to discuss hysterectomy as definitive therapy for the management of abnormal uterine bleeding and dysmenorrhea. she was not a candidate for surgery at that time due to uncontrolled diabetes. Since then she states that she has worked on reducing her blood sugars with diet modifications and exercise.             Her  last hgbA1c in july 2022 was 7.9. she reports painful heavy menses and she is still interested in definitive therapy via hysterectomy.             Pt was last seen 12/07/2020: Pt reported she has been following up with Dr. Sabra Heck. Pt noted she has been trying to lose weight and maintain a healthy diet. LMP August lasting 5 days. Pt reported changing her pad hourly throughout the day. She noted pain is worse than usual. Noted she was bedbound for 3 days secondary to pain. Pt has tried aleve and midol for pain w/o relief of sx. Pt reported having 2 vaginal deliveries. Pt reported sexual intercourse is painful. Pt noted she was dx with mild sleep apnea. She was not prescribed a CPAP due to mild condition.             TODAY:             Pt has high blood pressure and is on losartan 25 mg daily.  Pt is on metformin for her diabetes. She notes that she takes this at night and it has improved her fasting numbers. She reports 70-115 or 130 at the highest fasting and after dinner 150 at the highest.      Current Medication:      Taking   Accu-Chek FastClix Lancets - Miscellaneous USE TO CHECK GLUCOSE TWO TO THREE TIMES DAILY.      Accu-Chek FastClix lancets . Marland Kitchen Use to test your blood sugar finger stick 2-3 times a day Dx E11.9.      Accu-Chek Guide(Blood Glucose Test) - Strip USE TO TEST YOUR BLOOD SUGAR TWICE DAILY TO THREE TIMES DAILY.      Accu-Chek Guide(Blood Glucose Test) w/Device Kit use to test your blood sugar finger stick once a day.      Glimepiride 4 MG Tablet 2 tablets with breakfast or the first main meal of the day Orally Once a day.      hydrOXYzine HCl 50 MG Tablet 1 tablet as needed Orally at hs, Notes: ARFEEN- PRN sleep.      Latuda(Lurasidone HCl) 80 MG Tablet 1 tablet with food Orally Once a day, Notes: ARFEEN- in PM.      Losartan Potassium 25 MG Tablet Take 1 tablet by mouth once daily.      metFORMIN HCl ER 500 MG Tablet Extended Release 24 Hour 2 tablets with evening meal Orally  Once a day, Notes: Huynh.      MVI 1 tab Oral.      Pantoprazole Sodium 20 MG Tablet Delayed Release Take 2 tablets by mouth twice daily.      Rosuvastatin Calcium 5 MG Tablet 1 tablet Orally Every Other day, Notes: Huynh.      Medication List reviewed and reconciled with the patient.      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   Periods : irregular, heavy .   LMP 12/24/2020.   Denies Birth control.   Last pap smear date 10/04/2019- neg. .   Denies Last mammogram date.   Abnormal pap smear yes.   STD Chlamydia, years ago.        OB History:   Number of pregnancies  4.   miscarriages  1.   abortion  2002.   Pregnancy # 1  vaginal delivery, boy 2007.   Pregnancy # 2  vaginal delivery, boy 2011.        Surgical History:   Essure, tubal procedure... Dr. Willis [place] 2011      gallbladder removal      ablation- endometrial with removal of Essure .. Dr. Willis [place] 2013       Hospitalization:   not in past year 8/22       Family History:   Father: alive 44 yrs, stomach ulcers, high chol, arthritis,heart and kidney disease, diagnosed with Diabetes, Coronary artery disease, COPD (chronic obstructive  pulmonary disease)    Mother: alive 77 yrs, lupis,pre glacauma, thyroid, kidney problem, breast lumps removed,lupus    Brother 1: 69 yrs, drug abuse, back injury    Children: two children ages 67 and 2 with no issues         2 children one with ear tubes, has family hx of liver cancer in uncles , denies any GYN family cancer hx, No Family History of Colon Cancer.     Social History:       General         Tobacco use cigarettes:  Former smoker, Quit in year  2008, Tobacco history last updated  01/01/2021, Vaping  No.           no EXPOSURE TO PASSIVE SMOKE, very light smoking for a few years..           no Alcohol, since 10/01/2007.           Caffeine: yes, soda, occasionally.           no Recreational drug use.           DIET: good, I try to eat as healthy as I can afford.           Exercise: yes, nothing structured.           Marital Status: married, Vicki Huynh.           Children: 2, Boys, 73 y and 53 yo, 85 yo with high funct autism Huynh,Vicki 01/14/2018 11:47:08 AM > 8th grader finishing, 3rd grader finishing, home schooling due to covid Huynh,Vicki 10/04/2019 11:04:38 AM >.           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      ROS:       CONSTITUTIONAL         Chills  No.  Fatigue  No.  Fever  No.  Night sweats  No.  Recent travel outside [place]  No.  Sweats  No.  Weight change  No.         OPHTHALMOLOGY         Blurring of vision  no.  Change in vision  no.  Double vision  no.         ENT         Dizziness  no.  Nose bleeds  no.  Sore throat  no.  Teeth pain  no.         ALLERGY         Hives  no.         CARDIOLOGY         Chest pain  no.  High blood pressure  no.  Irregular heart beat  no.  Leg edema  no.  Palpitations  no.         RESPIRATORY         Shortness of breath  no.  Cough  no.  Wheezing  no.  UROLOGY         Pain with urination  no.  Urinary urgency  no.  Urinary frequency  no.  Urinary incontinence  no.  Difficulty urinating  No.  Blood in  urine  No.         GASTROENTEROLOGY         Abdominal pain  no.  Appetite change  no.  Bloating/belching  no.  Blood in stool or on toilet paper  no.  Change in bowel movements  no.  Constipation  no.  Diarrhea  no.  Difficulty swallowing  no.  Nausea  no.         FEMALE REPRODUCTIVE         Vulvar pain  no.  Vulvar rash  no.  Abnormal vaginal bleeding  yes.  Breast pain  no.  Nipple discharge  no.  Pain with intercourse  yes.  Pelvic pain  no.  Unusual vaginal discharge  no.  Vaginal itching  no.         MUSCULOSKELETAL         Muscle aches  no.         NEUROLOGY         Headache  no.  Tingling/numbness  no.  Weakness  no.         PSYCHOLOGY         Depression  no.  Anxiety  no.  Nervousness  no.  Sleep disturbances  no.  Suicidal ideation  no .         ENDOCRINOLOGY         Excessive thirst  no.  Excessive urination  no.  Hair loss  no.  Heat or cold intolerance  no.         HEMATOLOGY/LYMPH         Abnormal bleeding  no.  Easy bruising  no.  Swollen glands  no.         DERMATOLOGY         New/changing skin lesion  no.  Rash  no.  Sores  no.            Negative except as stated in HPI.   Objective:    Vitals:        Wt 225.0, Wt change -2.2 lb, Ht 67, BMI 35.24, Pulse sitting 80, BP sitting 117/80.     Past Results:    Examination:          General Examination         CONSTITUTIONAL: alert, oriented, NAD.          SKIN:  moist, warm.          EYES:  Conjunctiva clear.          LUNGS: good I:E efffort noted, clear to auscultation bilaterally.          HEART: heart sounds are normal, rhythm is regular, no murmur.          ABDOMEN: soft, non-tender/non-distended, bowel sounds present.          FEMALE GENITOURINARY: normal external genitalia, labia - unremarkable, vagina - pink moist mucosa, no lesions or abnormal discharge, cervix - no discharge or lesions or CMT, adnexa - no masses or tenderness, uterus - nontender and normal size on palpation.          PSYCH:  affect  normal, good eye contact.      Physical Examination:       Chaperone present  Chaperone present Plata,Menda 01/01/2021 03:33:49 PM > , for pelvic exam.      Assessment:     Assessment:    Abnormal uterine bleeding - N93.9 (Primary)      Dysmenorrhea - N94.6        Plan:    Treatment:      Abnormal uterine bleeding          Notes:  planning robotic-assisted laparoscopic hysterectomy with bilateral salpingectomy for management of abnormal uterine bleeding and dysmenorrhea  Pt advised she will stay overnight, or 2 days if conversion to larger incision. She is advised that in order to be discharged from hospital, she will need to be able to ambulate, urinate,, tolerate food by mouth, and take pain medication by mouth. Discussed risks of hysterectomy including but not limited to infection, bleeding, conversion to larger incision, damage to her bowel, bladder, or ureters, with the need for further surgery. Discussed risk of blood transfusion and risk of HIV or hep B&C with blood transfusion. Pt is aware of risks and desires blood transfusion if needed. Pt advised to avoid NSAIDs (Aspirin, Aleve, Advil, Ibuprofen, Motrin) from now until surgery given risk of bleeding during surgery. She may take Tylenol for pain management. She is advised to avoid eating or drinking starting midnight prior to surgery. Discussed post-surgery avoidance of driving for 1 week and avoidance of lifting weight greater than 10 lbs or intercourse for 6-8 weeks after the procedure. Pt advised to get a stool softener for when she takes her oxycodone to avoid constipation. Pt advised that spotting can last up to 6 weeks after surgery.      Dysmenorrhea          Notes:  planning robotic-assisted laparoscopic hysterectomy with bilateral salpingectomy for management of abnormal uterine bleeding and dysmenorrhea

## 2021-01-15 ENCOUNTER — Observation Stay (HOSPITAL_BASED_OUTPATIENT_CLINIC_OR_DEPARTMENT_OTHER): Payer: Medicaid Other | Admitting: Anesthesiology

## 2021-01-15 ENCOUNTER — Encounter (HOSPITAL_BASED_OUTPATIENT_CLINIC_OR_DEPARTMENT_OTHER): Payer: Self-pay | Admitting: Obstetrics and Gynecology

## 2021-01-15 ENCOUNTER — Observation Stay (HOSPITAL_BASED_OUTPATIENT_CLINIC_OR_DEPARTMENT_OTHER)
Admission: RE | Admit: 2021-01-15 | Discharge: 2021-01-16 | Disposition: A | Discharge status: Home / Self Care | Payer: Medicaid Other | Source: Ambulatory Visit | Attending: Obstetrics and Gynecology | Admitting: Obstetrics and Gynecology

## 2021-01-15 ENCOUNTER — Encounter (HOSPITAL_BASED_OUTPATIENT_CLINIC_OR_DEPARTMENT_OTHER)
Admission: RE | Disposition: A | Discharge status: Home / Self Care | Payer: Self-pay | Source: Ambulatory Visit | Attending: Obstetrics and Gynecology

## 2021-01-15 ENCOUNTER — Other Ambulatory Visit: Payer: Self-pay

## 2021-01-15 DIAGNOSIS — Z7984 Long term (current) use of oral hypoglycemic drugs: Secondary | ICD-10-CM | POA: Insufficient documentation

## 2021-01-15 DIAGNOSIS — N946 Dysmenorrhea, unspecified: Secondary | ICD-10-CM | POA: Diagnosis present

## 2021-01-15 DIAGNOSIS — N939 Abnormal uterine and vaginal bleeding, unspecified: Secondary | ICD-10-CM | POA: Diagnosis present

## 2021-01-15 DIAGNOSIS — E119 Type 2 diabetes mellitus without complications: Secondary | ICD-10-CM | POA: Insufficient documentation

## 2021-01-15 DIAGNOSIS — N8003 Adenomyosis of the uterus: Secondary | ICD-10-CM | POA: Insufficient documentation

## 2021-01-15 DIAGNOSIS — D259 Leiomyoma of uterus, unspecified: Principal | ICD-10-CM | POA: Insufficient documentation

## 2021-01-15 DIAGNOSIS — I10 Essential (primary) hypertension: Secondary | ICD-10-CM | POA: Diagnosis not present

## 2021-01-15 DIAGNOSIS — Z90710 Acquired absence of both cervix and uterus: Secondary | ICD-10-CM | POA: Diagnosis present

## 2021-01-15 HISTORY — DX: Other specified postprocedural states: Z98.890

## 2021-01-15 HISTORY — DX: Headache, unspecified: R51.9

## 2021-01-15 HISTORY — DX: Sleep apnea, unspecified: G47.30

## 2021-01-15 HISTORY — DX: Gastro-esophageal reflux disease without esophagitis: K21.9

## 2021-01-15 HISTORY — DX: Unspecified osteoarthritis, unspecified site: M19.90

## 2021-01-15 HISTORY — DX: Nausea with vomiting, unspecified: R11.2

## 2021-01-15 HISTORY — DX: Presence of spectacles and contact lenses: Z97.3

## 2021-01-15 HISTORY — PX: ROBOTIC ASSISTED LAPAROSCOPIC HYSTERECTOMY AND SALPINGECTOMY: SHX6379

## 2021-01-15 LAB — GLUCOSE, CAPILLARY
Glucose-Capillary: 205 mg/dL — ABNORMAL HIGH (ref 70–99)
Glucose-Capillary: 213 mg/dL — ABNORMAL HIGH (ref 70–99)
Glucose-Capillary: 279 mg/dL — ABNORMAL HIGH (ref 70–99)

## 2021-01-15 LAB — POCT I-STAT, CHEM 8
BUN: 16 mg/dL (ref 6–20)
Calcium, Ion: 1.21 mmol/L (ref 1.15–1.40)
Chloride: 103 mmol/L (ref 98–111)
Creatinine, Ser: 0.7 mg/dL (ref 0.44–1.00)
Glucose, Bld: 146 mg/dL — ABNORMAL HIGH (ref 70–99)
HCT: 43 % (ref 36.0–46.0)
Hemoglobin: 14.6 g/dL (ref 12.0–15.0)
Potassium: 3.5 mmol/L (ref 3.5–5.1)
Sodium: 140 mmol/L (ref 135–145)
TCO2: 21 mmol/L — ABNORMAL LOW (ref 22–32)

## 2021-01-15 LAB — TYPE AND SCREEN
ABO/RH(D): A POS
Antibody Screen: NEGATIVE

## 2021-01-15 LAB — POCT PREGNANCY, URINE: Preg Test, Ur: NEGATIVE

## 2021-01-15 LAB — ABO/RH: ABO/RH(D): A POS

## 2021-01-15 SURGERY — XI ROBOTIC ASSISTED LAPAROSCOPIC HYSTERECTOMY AND SALPINGECTOMY
Anesthesia: General | Site: Abdomen

## 2021-01-15 MED ORDER — ONDANSETRON HCL 4 MG PO TABS: 4.0000 mg | ORAL_TABLET | Freq: Four times a day (QID) | ORAL | Status: DC | PRN

## 2021-01-15 MED ORDER — ACETAMINOPHEN 500 MG PO TABS
ORAL_TABLET | ORAL | Status: AC
  Filled 2021-01-15: qty 2

## 2021-01-15 MED ORDER — SIMETHICONE 80 MG PO CHEW: 80.0000 mg | CHEWABLE_TABLET | Freq: Four times a day (QID) | ORAL | Status: DC | PRN

## 2021-01-15 MED ORDER — FENTANYL CITRATE (PF) 100 MCG/2ML IJ SOLN
25.0000 ug | INTRAMUSCULAR | Status: DC | PRN
  Administered 2021-01-15 (×2): 25 ug via INTRAVENOUS

## 2021-01-15 MED ORDER — SENNA 8.6 MG PO TABS
1.0000 | ORAL_TABLET | Freq: Two times a day (BID) | ORAL | Status: DC
  Administered 2021-01-15: 8.6 mg via ORAL

## 2021-01-15 MED ORDER — PROPOFOL 10 MG/ML IV BOLUS
INTRAVENOUS | Status: DC | PRN
  Administered 2021-01-15: 50 mg via INTRAVENOUS
  Administered 2021-01-15: 180 mg via INTRAVENOUS
  Administered 2021-01-15: 50 mg via INTRAVENOUS
  Administered 2021-01-15: 20 mg via INTRAVENOUS
  Administered 2021-01-15: 50 mg via INTRAVENOUS

## 2021-01-15 MED ORDER — STERILE WATER FOR IRRIGATION IR SOLN
Status: DC | PRN
  Administered 2021-01-15: 500 mL

## 2021-01-15 MED ORDER — IBUPROFEN 200 MG PO TABS
600.0000 mg | ORAL_TABLET | Freq: Four times a day (QID) | ORAL | Status: DC
  Administered 2021-01-15 – 2021-01-16 (×3): 600 mg via ORAL

## 2021-01-15 MED ORDER — HYDROMORPHONE HCL 1 MG/ML IJ SOLN
0.2000 mg | INTRAMUSCULAR | Status: DC | PRN
  Administered 2021-01-15: 0.5 mg via INTRAVENOUS

## 2021-01-15 MED ORDER — PANTOPRAZOLE SODIUM 40 MG PO TBEC
40.0000 mg | DELAYED_RELEASE_TABLET | Freq: Two times a day (BID) | ORAL | Status: DC
  Administered 2021-01-15: 40 mg via ORAL

## 2021-01-15 MED ORDER — APREPITANT 40 MG PO CAPS
ORAL_CAPSULE | ORAL | Status: AC
  Filled 2021-01-15: qty 1

## 2021-01-15 MED ORDER — SODIUM CHLORIDE (PF) 0.9 % IJ SOLN
INTRAMUSCULAR | Status: DC | PRN
  Administered 2021-01-15: 10 mL via INTRAVENOUS

## 2021-01-15 MED ORDER — OXYCODONE HCL 5 MG PO TABS
5.0000 mg | ORAL_TABLET | ORAL | Status: DC | PRN
  Administered 2021-01-15 (×2): 5 mg via ORAL
  Administered 2021-01-16 (×2): 10 mg via ORAL

## 2021-01-15 MED ORDER — CEFOTETAN DISODIUM 2 G IJ SOLR
2.0000 g | INTRAMUSCULAR | Status: AC
  Administered 2021-01-15: 2 g via INTRAVENOUS

## 2021-01-15 MED ORDER — ACETAMINOPHEN 500 MG PO TABS
1000.0000 mg | ORAL_TABLET | Freq: Four times a day (QID) | ORAL | Status: DC
  Administered 2021-01-15 – 2021-01-16 (×3): 1000 mg via ORAL

## 2021-01-15 MED ORDER — MIDAZOLAM HCL 5 MG/5ML IJ SOLN
INTRAMUSCULAR | Status: DC | PRN
  Administered 2021-01-15: 2 mg via INTRAVENOUS
  Administered 2021-01-15 (×2): 1 mg via INTRAVENOUS

## 2021-01-15 MED ORDER — ONDANSETRON HCL 4 MG/2ML IJ SOLN
INTRAMUSCULAR | Status: AC
  Filled 2021-01-15: qty 2

## 2021-01-15 MED ORDER — LIDOCAINE 2% (20 MG/ML) 5 ML SYRINGE
INTRAMUSCULAR | Status: AC
  Filled 2021-01-15: qty 5

## 2021-01-15 MED ORDER — ROCURONIUM BROMIDE 100 MG/10ML IV SOLN
INTRAVENOUS | Status: DC | PRN
  Administered 2021-01-15: 20 mg via INTRAVENOUS
  Administered 2021-01-15: 60 mg via INTRAVENOUS

## 2021-01-15 MED ORDER — KETOROLAC TROMETHAMINE 30 MG/ML IJ SOLN
INTRAMUSCULAR | Status: AC
  Filled 2021-01-15: qty 1

## 2021-01-15 MED ORDER — LURASIDONE HCL 40 MG PO TABS
80.0000 mg | ORAL_TABLET | Freq: Every day | ORAL | Status: DC
  Administered 2021-01-15: 80 mg via ORAL
  Filled 2021-01-15: qty 2

## 2021-01-15 MED ORDER — SUGAMMADEX SODIUM 200 MG/2ML IV SOLN
INTRAVENOUS | Status: DC | PRN
  Administered 2021-01-15: 200 mg via INTRAVENOUS

## 2021-01-15 MED ORDER — KETOROLAC TROMETHAMINE 30 MG/ML IJ SOLN
INTRAMUSCULAR | Status: DC | PRN
  Administered 2021-01-15: 30 mg via INTRAVENOUS

## 2021-01-15 MED ORDER — INSULIN ASPART 100 UNIT/ML IJ SOLN
INTRAMUSCULAR | Status: AC
  Filled 2021-01-15: qty 1

## 2021-01-15 MED ORDER — INSULIN ASPART 100 UNIT/ML IJ SOLN
0.0000 [IU] | INTRAMUSCULAR | Status: DC
  Administered 2021-01-15: 8 [IU] via SUBCUTANEOUS
  Administered 2021-01-15: 12 [IU] via SUBCUTANEOUS
  Administered 2021-01-15 – 2021-01-16 (×2): 8 [IU] via SUBCUTANEOUS

## 2021-01-15 MED ORDER — SCOPOLAMINE 1 MG/3DAYS TD PT72
MEDICATED_PATCH | TRANSDERMAL | Status: AC
  Filled 2021-01-15: qty 1

## 2021-01-15 MED ORDER — IBUPROFEN 200 MG PO TABS
ORAL_TABLET | ORAL | Status: AC
  Filled 2021-01-15: qty 3

## 2021-01-15 MED ORDER — PANTOPRAZOLE SODIUM 40 MG PO TBEC
DELAYED_RELEASE_TABLET | ORAL | Status: AC
  Filled 2021-01-15: qty 1

## 2021-01-15 MED ORDER — SCOPOLAMINE 1 MG/3DAYS TD PT72
1.0000 | MEDICATED_PATCH | TRANSDERMAL | Status: DC
  Administered 2021-01-15: 1.5 mg via TRANSDERMAL

## 2021-01-15 MED ORDER — MIDAZOLAM HCL 2 MG/2ML IJ SOLN
INTRAMUSCULAR | Status: AC
  Filled 2021-01-15: qty 2

## 2021-01-15 MED ORDER — GLIMEPIRIDE 4 MG PO TABS
4.0000 mg | ORAL_TABLET | Freq: Every day | ORAL | Status: DC
  Administered 2021-01-15: 4 mg via ORAL
  Filled 2021-01-15: qty 1

## 2021-01-15 MED ORDER — HYDROMORPHONE HCL 1 MG/ML IJ SOLN
INTRAMUSCULAR | Status: AC
  Filled 2021-01-15: qty 1

## 2021-01-15 MED ORDER — ROCURONIUM BROMIDE 10 MG/ML (PF) SYRINGE
PREFILLED_SYRINGE | INTRAVENOUS | Status: AC
  Filled 2021-01-15: qty 10

## 2021-01-15 MED ORDER — LACTATED RINGERS IV SOLN: INTRAVENOUS | Status: DC

## 2021-01-15 MED ORDER — LIDOCAINE HCL (CARDIAC) PF 100 MG/5ML IV SOSY
PREFILLED_SYRINGE | INTRAVENOUS | Status: DC | PRN
  Administered 2021-01-15: 60 mg via INTRAVENOUS

## 2021-01-15 MED ORDER — POVIDONE-IODINE 10 % EX SWAB: 2.0000 "application " | Freq: Once | CUTANEOUS | Status: DC

## 2021-01-15 MED ORDER — CELECOXIB 200 MG PO CAPS
ORAL_CAPSULE | ORAL | Status: AC
  Filled 2021-01-15: qty 2

## 2021-01-15 MED ORDER — ONDANSETRON HCL 4 MG/2ML IJ SOLN
4.0000 mg | Freq: Four times a day (QID) | INTRAMUSCULAR | Status: DC | PRN
  Administered 2021-01-15: 4 mg via INTRAVENOUS

## 2021-01-15 MED ORDER — DEXAMETHASONE SODIUM PHOSPHATE 4 MG/ML IJ SOLN
INTRAMUSCULAR | Status: DC | PRN
  Administered 2021-01-15: 5 mg via INTRAVENOUS

## 2021-01-15 MED ORDER — SODIUM CHLORIDE 0.9 % IV SOLN
INTRAVENOUS | Status: AC
  Filled 2021-01-15: qty 2

## 2021-01-15 MED ORDER — PROPOFOL 10 MG/ML IV BOLUS
INTRAVENOUS | Status: AC
  Filled 2021-01-15: qty 40

## 2021-01-15 MED ORDER — ACETAMINOPHEN 500 MG PO TABS
1000.0000 mg | ORAL_TABLET | ORAL | Status: AC
  Administered 2021-01-15: 1000 mg via ORAL

## 2021-01-15 MED ORDER — ONDANSETRON HCL 4 MG/2ML IJ SOLN
INTRAMUSCULAR | Status: DC | PRN
  Administered 2021-01-15: 4 mg via INTRAVENOUS

## 2021-01-15 MED ORDER — ORAL CARE MOUTH RINSE: 15.0000 mL | Freq: Once | OROMUCOSAL | Status: DC

## 2021-01-15 MED ORDER — HEMOSTATIC AGENTS (NO CHARGE) OPTIME
TOPICAL | Status: DC | PRN
  Administered 2021-01-15: 1 via TOPICAL

## 2021-01-15 MED ORDER — CHLORHEXIDINE GLUCONATE 0.12 % MT SOLN: 15.0000 mL | Freq: Once | OROMUCOSAL | Status: DC

## 2021-01-15 MED ORDER — FENTANYL CITRATE (PF) 100 MCG/2ML IJ SOLN
INTRAMUSCULAR | Status: AC
  Filled 2021-01-15: qty 2

## 2021-01-15 MED ORDER — ALUM & MAG HYDROXIDE-SIMETH 200-200-20 MG/5ML PO SUSP: 30.0000 mL | ORAL | Status: DC | PRN

## 2021-01-15 MED ORDER — DEXAMETHASONE SODIUM PHOSPHATE 10 MG/ML IJ SOLN
INTRAMUSCULAR | Status: AC
  Filled 2021-01-15: qty 1

## 2021-01-15 MED ORDER — HYDROXYZINE HCL 50 MG PO TABS
50.0000 mg | ORAL_TABLET | Freq: Every day | ORAL | Status: DC
  Administered 2021-01-15: 50 mg via ORAL
  Filled 2021-01-15: qty 1

## 2021-01-15 MED ORDER — CELECOXIB 200 MG PO CAPS
400.0000 mg | ORAL_CAPSULE | ORAL | Status: AC
  Administered 2021-01-15: 400 mg via ORAL

## 2021-01-15 MED ORDER — OXYCODONE HCL 5 MG PO TABS
ORAL_TABLET | ORAL | Status: AC
  Filled 2021-01-15: qty 1

## 2021-01-15 MED ORDER — FENTANYL CITRATE (PF) 250 MCG/5ML IJ SOLN
INTRAMUSCULAR | Status: AC
  Filled 2021-01-15: qty 5

## 2021-01-15 MED ORDER — FENTANYL CITRATE (PF) 100 MCG/2ML IJ SOLN
INTRAMUSCULAR | Status: DC | PRN
  Administered 2021-01-15 (×2): 50 ug via INTRAVENOUS
  Administered 2021-01-15: 25 ug via INTRAVENOUS
  Administered 2021-01-15 (×4): 50 ug via INTRAVENOUS
  Administered 2021-01-15: 25 ug via INTRAVENOUS

## 2021-01-15 MED ORDER — APREPITANT 40 MG PO CAPS
40.0000 mg | ORAL_CAPSULE | Freq: Once | ORAL | Status: AC
  Administered 2021-01-15: 40 mg via ORAL

## 2021-01-15 MED ORDER — SENNA 8.6 MG PO TABS
ORAL_TABLET | ORAL | Status: AC
  Filled 2021-01-15: qty 1

## 2021-01-15 MED ORDER — KETOROLAC TROMETHAMINE 30 MG/ML IJ SOLN: 30.0000 mg | Freq: Once | INTRAMUSCULAR | Status: DC

## 2021-01-15 MED ORDER — SODIUM CHLORIDE 0.9 % IV SOLN
INTRAVENOUS | Status: DC | PRN
  Administered 2021-01-15: 120 mL

## 2021-01-15 MED ORDER — SODIUM CHLORIDE 0.9 % IR SOLN
Status: DC | PRN
  Administered 2021-01-15: 1000 mL

## 2021-01-15 MED ORDER — MENTHOL 3 MG MT LOZG: 1.0000 | LOZENGE | OROMUCOSAL | Status: DC | PRN

## 2021-01-15 MED ORDER — LOSARTAN POTASSIUM 25 MG PO TABS
25.0000 mg | ORAL_TABLET | Freq: Every evening | ORAL | Status: DC
  Administered 2021-01-15: 25 mg via ORAL
  Filled 2021-01-15: qty 1

## 2021-01-15 MED ORDER — SODIUM CHLORIDE (PF) 0.9 % IJ SOLN
INTRAMUSCULAR | Status: DC | PRN
  Administered 2021-01-15: 50 mL via INTRAVENOUS

## 2021-01-15 SURGICAL SUPPLY — 76 items
ADH SKN CLS APL DERMABOND .7 (GAUZE/BANDAGES/DRESSINGS) ×1
APL SRG 38 LTWT LNG FL B (MISCELLANEOUS) ×1
APPLICATOR ARISTA FLEXITIP XL (MISCELLANEOUS) ×2 IMPLANT
BARRIER ADHS 3X4 INTERCEED (GAUZE/BANDAGES/DRESSINGS) IMPLANT
BRR ADH 4X3 ABS CNTRL BYND (GAUZE/BANDAGES/DRESSINGS)
CATH FOLEY 3WAY  5CC 16FR (CATHETERS) ×1
CATH FOLEY 3WAY 5CC 16FR (CATHETERS) ×1 IMPLANT
CELLS DAT CNTRL 66122 CELL SVR (MISCELLANEOUS) IMPLANT
COVER BACK TABLE 60X90IN (DRAPES) ×2 IMPLANT
COVER TIP SHEARS 8 DVNC (MISCELLANEOUS) ×1 IMPLANT
COVER TIP SHEARS 8MM DA VINCI (MISCELLANEOUS) ×1
DECANTER SPIKE VIAL GLASS SM (MISCELLANEOUS) ×4 IMPLANT
DEFOGGER SCOPE WARMER CLEARIFY (MISCELLANEOUS) ×2 IMPLANT
DERMABOND ADVANCED (GAUZE/BANDAGES/DRESSINGS) ×1
DERMABOND ADVANCED .7 DNX12 (GAUZE/BANDAGES/DRESSINGS) ×1 IMPLANT
DILATOR CANAL MILEX (MISCELLANEOUS) ×2 IMPLANT
DRAPE ARM DVNC X/XI (DISPOSABLE) ×4 IMPLANT
DRAPE COLUMN DVNC XI (DISPOSABLE) ×1 IMPLANT
DRAPE DA VINCI XI ARM (DISPOSABLE) ×4
DRAPE DA VINCI XI COLUMN (DISPOSABLE) ×1
DRAPE UTILITY 15X26 TOWEL STRL (DRAPES) ×2 IMPLANT
DURAPREP 26ML APPLICATOR (WOUND CARE) ×2 IMPLANT
ELECT REM PT RETURN 9FT ADLT (ELECTROSURGICAL) ×2
ELECTRODE REM PT RTRN 9FT ADLT (ELECTROSURGICAL) ×1 IMPLANT
GAUZE 4X4 16PLY ~~LOC~~+RFID DBL (SPONGE) ×4 IMPLANT
GLOVE SURG ENC TEXT LTX SZ6.5 (GLOVE) ×6 IMPLANT
GLOVE SURG POLYISO LF SZ7 (GLOVE) ×2 IMPLANT
GLOVE SURG POLYISO LF SZ7.5 (GLOVE) ×4 IMPLANT
GLOVE SURG UNDER POLY LF SZ6.5 (GLOVE) ×12 IMPLANT
GLOVE SURG UNDER POLY LF SZ7 (GLOVE) ×10 IMPLANT
GLOVE SURG UNDER POLY LF SZ7.5 (GLOVE) ×8 IMPLANT
HEMOSTAT ARISTA ABSORB 3G PWDR (HEMOSTASIS) ×2 IMPLANT
HOLDER FOLEY CATH W/STRAP (MISCELLANEOUS) IMPLANT
IRRIG SUCT STRYKERFLOW 2 WTIP (MISCELLANEOUS) ×2
IRRIGATION SUCT STRKRFLW 2 WTP (MISCELLANEOUS) ×1 IMPLANT
IV NS 1000ML (IV SOLUTION) ×2
IV NS 1000ML BAXH (IV SOLUTION) ×1 IMPLANT
KIT TURNOVER CYSTO (KITS) ×2 IMPLANT
LEGGING LITHOTOMY PAIR STRL (DRAPES) ×2 IMPLANT
OBTURATOR OPTICAL STANDARD 8MM (TROCAR)
OBTURATOR OPTICAL STND 8 DVNC (TROCAR)
OBTURATOR OPTICALSTD 8 DVNC (TROCAR) IMPLANT
OCCLUDER COLPOPNEUMO (BALLOONS) ×2 IMPLANT
PACK ROBOT WH (CUSTOM PROCEDURE TRAY) ×2 IMPLANT
PACK ROBOTIC GOWN (GOWN DISPOSABLE) ×2 IMPLANT
PACK TRENDGUARD 450 HYBRID PRO (MISCELLANEOUS) IMPLANT
PAD OB MATERNITY 4.3X12.25 (PERSONAL CARE ITEMS) ×2 IMPLANT
PAD PREP 24X48 CUFFED NSTRL (MISCELLANEOUS) ×2 IMPLANT
PROTECTOR NERVE ULNAR (MISCELLANEOUS) ×2 IMPLANT
RTRCTR WOUND ALEXIS 18CM MED (MISCELLANEOUS)
RTRCTR WOUND ALEXIS 18CM SML (INSTRUMENTS)
SAVER CELL AAL HAEMONETICS (INSTRUMENTS) IMPLANT
SCISSORS LAP 5X45 EPIX DISP (ENDOMECHANICALS) IMPLANT
SEAL CANN UNIV 5-8 DVNC XI (MISCELLANEOUS) ×4 IMPLANT
SEAL XI 5MM-8MM UNIVERSAL (MISCELLANEOUS) ×4
SEALER VESSEL DA VINCI XI (MISCELLANEOUS) ×1
SEALER VESSEL EXT DVNC XI (MISCELLANEOUS) ×1 IMPLANT
SET IRRIG Y TYPE TUR BLADDER L (SET/KITS/TRAYS/PACK) IMPLANT
SET TRI-LUMEN FLTR TB AIRSEAL (TUBING) IMPLANT
SPONGE T-LAP 4X18 ~~LOC~~+RFID (SPONGE) ×2 IMPLANT
SUT VIC AB 0 CT1 27 (SUTURE) ×4
SUT VIC AB 0 CT1 27XBRD ANBCTR (SUTURE) ×2 IMPLANT
SUT VICRYL 0 UR6 27IN ABS (SUTURE) IMPLANT
SUT VICRYL RAPIDE 4/0 PS 2 (SUTURE) ×6 IMPLANT
SUT VLOC 180 0 9IN  GS21 (SUTURE) ×1
SUT VLOC 180 0 9IN GS21 (SUTURE) ×1 IMPLANT
TIP RUMI ORANGE 6.7MMX12CM (TIP) IMPLANT
TIP UTERINE 5.1X6CM LAV DISP (MISCELLANEOUS) IMPLANT
TIP UTERINE 6.7X10CM GRN DISP (MISCELLANEOUS) IMPLANT
TIP UTERINE 6.7X6CM WHT DISP (MISCELLANEOUS) IMPLANT
TIP UTERINE 6.7X8CM BLUE DISP (MISCELLANEOUS) ×2 IMPLANT
TOWEL OR 17X26 10 PK STRL BLUE (TOWEL DISPOSABLE) ×2 IMPLANT
TRENDGUARD 450 HYBRID PRO PACK (MISCELLANEOUS)
TROCAR PORT AIRSEAL 8X120 (TROCAR) ×2 IMPLANT
WATER STERILE IRR 1000ML POUR (IV SOLUTION) ×2 IMPLANT
WATER STERILE IRR 500ML POUR (IV SOLUTION) ×2 IMPLANT

## 2021-01-15 NOTE — Anesthesia Postprocedure Evaluation (Signed)
Anesthesia Post Note  Patient: Vicki Huynh  Procedure(s) Performed: XI ROBOTIC ASSISTED LAPAROSCOPIC HYSTERECTOMY AND SALPINGECTOMY (Abdomen)     Patient location during evaluation: PACU Anesthesia Type: General Level of consciousness: awake and alert Pain management: pain level controlled Vital Signs Assessment: post-procedure vital signs reviewed and stable Respiratory status: spontaneous breathing, nonlabored ventilation, respiratory function stable and patient connected to nasal cannula oxygen Cardiovascular status: blood pressure returned to baseline and stable Postop Assessment: no apparent nausea or vomiting Anesthetic complications: no   No notable events documented.  Last Vitals:  Vitals:   01/15/21 1430 01/15/21 1455  BP: (!) 134/97 (!) 144/87  Pulse: (!) 106 95  Resp: 16 16  Temp: 36.6 C 36.5 C  SpO2: 96% 95%    Last Pain:  Vitals:   01/15/21 1455  TempSrc:   PainSc: Asleep                 Belenda Cruise P Orie Cuttino

## 2021-01-15 NOTE — Anesthesia Preprocedure Evaluation (Addendum)
Anesthesia Evaluation  Patient identified by MRN, date of birth, ID band Patient awake    Reviewed: Allergy & Precautions, NPO status , Patient's Chart, lab work & pertinent test results  History of Anesthesia Complications (+) PONV  Airway Mallampati: II  TM Distance: >3 FB Neck ROM: Full    Dental  (+) Teeth Intact   Pulmonary sleep apnea ,    Pulmonary exam normal        Cardiovascular hypertension, Pt. on medications  Rhythm:Regular Rate:Normal     Neuro/Psych  Headaches, Anxiety Depression Bipolar Disorder    GI/Hepatic Neg liver ROS, GERD  Medicated,  Endo/Other  diabetes, Well Controlled, Type 2, Oral Hypoglycemic Agents  Renal/GU negative Renal ROS  Female GU complaint AUB    Musculoskeletal  (+) Arthritis , Osteoarthritis,    Abdominal (+)  Abdomen: soft.    Peds  Hematology   Anesthesia Other Findings   Reproductive/Obstetrics                            Anesthesia Physical Anesthesia Plan  ASA: 2  Anesthesia Plan: General   Post-op Pain Management:    Induction: Intravenous  PONV Risk Score and Plan: 4 or greater and Ondansetron, Aprepitant, Dexamethasone, Midazolam, Scopolamine patch - Pre-op and Treatment may vary due to age or medical condition  Airway Management Planned: Mask and Oral ETT  Additional Equipment: None  Intra-op Plan:   Post-operative Plan: Extubation in OR  Informed Consent: I have reviewed the patients History and Physical, chart, labs and discussed the procedure including the risks, benefits and alternatives for the proposed anesthesia with the patient or authorized representative who has indicated his/her understanding and acceptance.     Dental advisory given  Plan Discussed with: CRNA  Anesthesia Plan Comments:        Anesthesia Quick Evaluation

## 2021-01-15 NOTE — Anesthesia Procedure Notes (Signed)
Procedure Name: Intubation Date/Time: 01/15/2021 11:16 AM Performed by: Justice Rocher, CRNA Pre-anesthesia Checklist: Patient identified, Emergency Drugs available, Suction available, Patient being monitored and Timeout performed Patient Re-evaluated:Patient Re-evaluated prior to induction Oxygen Delivery Method: Circle system utilized Preoxygenation: Pre-oxygenation with 100% oxygen Induction Type: IV induction Ventilation: Mask ventilation without difficulty Tube type: Oral Tube size: 7.0 mm Number of attempts: 1 Airway Equipment and Method: Stylet and Oral airway Placement Confirmation: ETT inserted through vocal cords under direct vision, positive ETCO2, breath sounds checked- equal and bilateral and CO2 detector Secured at: 22 cm Tube secured with: Tape Dental Injury: Teeth and Oropharynx as per pre-operative assessment

## 2021-01-15 NOTE — Op Note (Signed)
01/15/2021  1:11 PM  PATIENT:  Vicki Huynh  40 y.o. female  PRE-OPERATIVE DIAGNOSIS:  Dysmenorrhea Abnormal Uterine Bleeding  POST-OPERATIVE DIAGNOSIS:  Dysmenorrhea Abnormal Uterine Bleeding  PROCEDURE:  Procedure(s): XI ROBOTIC ASSISTED LAPAROSCOPIC HYSTERECTOMY AND SALPINGECTOMY (N/A)  SURGEON:  Surgeon(s) and Role:    Christophe Louis, MD - Primary  PHYSICIAN ASSISTANT: None  ASSISTANTS: Gaylord Shih RNFA assisted due to complexity of the anatomy   ANESTHESIA:   general  EBL:  50 mL   BLOOD ADMINISTERED:none  DRAINS: Urinary Catheter (Foley)   LOCAL MEDICATIONS USED:  OTHER ropivicaine   SPECIMEN:  Source of Specimen:  uterus and cervix   DISPOSITION OF SPECIMEN:  PATHOLOGY  COUNTS:  YES  TOURNIQUET:  * No tourniquets in log *  DICTATION: .Dragon Dictation  PLAN OF CARE: Admit for overnight observation  PATIENT DISPOSITION:  PACU - hemodynamically stable.   Delay start of Pharmacological VTE agent (>24hrs) due to surgical blood loss or risk of bleeding: not applicable  Findings : Normal external genitalia. Normal vaginal mucosa and cervix. Fallopian tubes are absent. Ovaries appear normal bilaterally .   Procedure: The patient was taken to operating room #5 at [facility] she was placed under general anesthesia.Time out was performed. Marland Kitchen She was placed in dorsal lithotomy position and prepped and draped in the usual sterile fashion. A weighted speculum was placed into the vagina. A Deaver was placed anteriorly for retraction. The anterior lip of the cervix was grasped with a single-tooth tenaculum. The vaginal mucosa was injected with 2.5 cc of ropivacaine at the 2/4/ 8 and 10 o'clock positions. The uterus was sounded to 8 cm. the cervix was dilated to 6 mm . 0 vicryl suture placed at the 12 and 6:00 positions Of the cervix to facilitate placement of a Ru mi uterine manipulator. The manipulator was placed without difficulty. Weighted speculum and Deaver were  removed .  Attention was turned to the patient's abdomen where a 8 mm trocar was placed 2 cm above the umbilicus. under direct visualization . The pneumoperitoneum was achieved with PCO2 gas. The laparoscope was removed. 60 cc of ropivacaine were injected into the abdominal cavity. The laparoscope was reinserted. An 8 mm trocar was placed in the right upper quadrant 16 centimeters from the umbilicus.later connected to robotic arm #4). An 8MM incision was made in the Right upper quadrant TROCAR WAS PLACED 8 cm from the umbilicus. Later connected to robotic arm #3. An 8 mm incision was made in the left upper quadrant 16 cm from the umbilicus and connected to robot arm #1. Marland Kitchen Attention was turned to the left upper quadrant where a 8 mm midclavicular assistant trocar was placed. ( All incision sites were injected with 10cc of ropivacaine prior to port placement. )  Once all ports had been placed under direct visualization.The laparoscope was removed and the [employer] robotic system was then right-sided docked. The robotic arms were connected to the corresponding trocars as listed above. The laparoscope was then reinserted. The long tip bipolar forceps were placed into port #1. The prograsp placed in the port #4. A vessel sealerwas placed in port #3. All instruments were directed into the pelvis under direct visualization.  Attention was turned to the surgeons console.. The left utero-ovarian ligament was cauterized and transected with the vessel sealer The broad ligament was cauterized and transected with the vessel sealer .The round ligament was cauterized and transected with the vessel sealer  The anterior leaf of broad ligament was  incised along the bladder reflection to the midline.  The right utero-ovarian ligament was cauterized and transected with the vessel sealer. The right broad ligament was cauterized and transected with the vessel sealer. The right round ligament was cauterized and transected with the  vessel sealer The broad ligament was incised to the midline. The bladder was dissected off the lower uterine segments of the cervix via sharp and blunt dissection.   The uterine arteries were skeleton bilaterally. They were  cauterized and transected with the vessel sealer The KOH ring was identified. The anterior colpotomy was performed followed by the posterior colpotomy. Once the uterus,cervix and bilateral fallopian tubes were completely excised was removed through the vagina. The  bipolar forceps and scissors were removed and cobra forceps were placed in the port #1 and the mega needle driver was placed in to port #3.  The vaginal cuff was closed with running suture if 0 v-lock. The pelvis was irrigated. Marland KitchenMarland KitchenMarland KitchenExcellent hemostasis was noted. Arista was placed along the vaginal cuff.  All pelvic pedicles were examined and hemostasis was noted.  All instruments removed from the ports. All ports were removed under direct Visualization. The pneumoperitoneum was released. The skin incisions were closed with 4-0 Vicryl and then covered with Derma bond.     Sponge lap and needle counts weIre correct x. The patient was awakened from anesthesia and taken to the recovery room in stable condition.

## 2021-01-15 NOTE — H&P (Signed)
Date of Initial H&P: 01/14/2021  History reviewed, patient examined, no change in status, stable for surgery.

## 2021-01-15 NOTE — Transfer of Care (Signed)
Immediate Anesthesia Transfer of Care Note  Patient: Vicki Huynh  Procedure(s) Performed: Procedure(s) (LRB): XI ROBOTIC ASSISTED LAPAROSCOPIC HYSTERECTOMY AND SALPINGECTOMY (N/A)  Patient Location: PACU  Anesthesia Type: General  Level of Consciousness: awake, sedated, patient cooperative and responds to stimulation  Airway & Oxygen Therapy: Patient Spontanous Breathing and Patient connected to JAARS 02 and soft FM   Post-op Assessment: Report given to PACU RN, Post -op Vital signs reviewed and stable and Patient moving all extremities  Post vital signs: Reviewed and stable  Complications: No apparent anesthesia complications

## 2021-01-16 ENCOUNTER — Encounter (HOSPITAL_BASED_OUTPATIENT_CLINIC_OR_DEPARTMENT_OTHER): Payer: Self-pay | Admitting: Obstetrics and Gynecology

## 2021-01-16 DIAGNOSIS — D259 Leiomyoma of uterus, unspecified: Secondary | ICD-10-CM | POA: Diagnosis not present

## 2021-01-16 LAB — GLUCOSE, CAPILLARY
Glucose-Capillary: 212 mg/dL — ABNORMAL HIGH (ref 70–99)
Glucose-Capillary: 106 mg/dL — ABNORMAL HIGH (ref 70–99)

## 2021-01-16 LAB — SURGICAL PATHOLOGY

## 2021-01-16 LAB — HEMOGLOBIN: Hemoglobin: 12.9 g/dL (ref 12.0–15.0)

## 2021-01-16 MED ORDER — OXYCODONE HCL 5 MG PO TABS
ORAL_TABLET | ORAL | Status: AC
  Filled 2021-01-16: qty 1

## 2021-01-16 MED ORDER — IBUPROFEN 600 MG PO TABS: 600.0000 mg | ORAL_TABLET | Freq: Four times a day (QID) | ORAL | 1 refills | Status: DC | PRN

## 2021-01-16 MED ORDER — ACETAMINOPHEN 500 MG PO TABS
ORAL_TABLET | ORAL | Status: AC
  Filled 2021-01-16: qty 2

## 2021-01-16 MED ORDER — OXYCODONE HCL 5 MG PO TABS
ORAL_TABLET | ORAL | Status: AC
  Filled 2021-01-16: qty 2

## 2021-01-16 MED ORDER — OXYCODONE HCL 5 MG PO TABS: 5.0000 mg | ORAL_TABLET | ORAL | 0 refills | Status: DC | PRN

## 2021-01-16 MED ORDER — IBUPROFEN 200 MG PO TABS
ORAL_TABLET | ORAL | Status: AC
  Filled 2021-01-16: qty 3

## 2021-01-16 MED ORDER — ACETAMINOPHEN 500 MG PO TABS: 1000.0000 mg | ORAL_TABLET | Freq: Three times a day (TID) | ORAL | 0 refills | Status: AC | PRN

## 2021-01-16 MED ORDER — INSULIN ASPART 100 UNIT/ML IJ SOLN
INTRAMUSCULAR | Status: AC
  Filled 2021-01-16: qty 1

## 2021-01-16 NOTE — Discharge Summary (Signed)
Physician Discharge Summary  Patient ID: Vicki Huynh MRN: [id number] DOB/AGE: [birth year] 40 y.o.  Admit date: 01/15/2021 Discharge date: 01/16/2021  Admission Diagnoses: Abnormal uterine bleeding   Discharge Diagnoses:  Active Problems:   Abnormal uterine bleeding   S/P laparoscopic hysterectomy   Discharged Condition: stable  Hospital Course: pt was admitted for observation after undergoing a robotic assisted laparoscopic hysterectomy on 01/15/2021. She did well postoperatively with return of bowel function. She is tolerating po and her pain is well controlled.   Consults: None  Significant Diagnostic Studies: labs:  Results for orders placed or performed during the hospital encounter of 01/15/21 (from the past 24 hour(s))  Pregnancy, urine POC     Status: None   Collection Time: 01/15/21  9:11 AM  Result Value Ref Range   Preg Test, Ur NEGATIVE NEGATIVE  ABO/Rh     Status: None   Collection Time: 01/15/21 10:09 AM  Result Value Ref Range   ABO/RH(D)      A POS Performed at [facility], [place] [address], [place], [place] [address]   I-STAT, chem 8     Status: Abnormal   Collection Time: 01/15/21 10:15 AM  Result Value Ref Range   Sodium 140 135 - 145 mmol/L   Potassium 3.5 3.5 - 5.1 mmol/L   Chloride 103 98 - 111 mmol/L   BUN 16 6 - 20 mg/dL   Creatinine, Ser 0.70 0.44 - 1.00 mg/dL   Glucose, Bld 146 (H) 70 - 99 mg/dL   Calcium, Ion 1.21 1.15 - 1.40 mmol/L   TCO2 21 (L) 22 - 32 mmol/L   Hemoglobin 14.6 12.0 - 15.0 g/dL   HCT 43.0 36.0 - 46.0 %  Glucose, capillary     Status: Abnormal   Collection Time: 01/15/21  1:25 PM  Result Value Ref Range   Glucose-Capillary 205 (H) 70 - 99 mg/dL  Glucose, capillary     Status: Abnormal   Collection Time: 01/15/21  5:46 PM  Result Value Ref Range   Glucose-Capillary 213 (H) 70 - 99 mg/dL   Comment 1 Notify RN    Comment 2 Document in Chart   Glucose, capillary     Status: Abnormal   Collection  Time: 01/15/21 10:06 PM  Result Value Ref Range   Glucose-Capillary 279 (H) 70 - 99 mg/dL  Glucose, capillary     Status: Abnormal   Collection Time: 01/16/21  2:25 AM  Result Value Ref Range   Glucose-Capillary 212 (H) 70 - 99 mg/dL  Hemoglobin     Status: None   Collection Time: 01/16/21  3:00 AM  Result Value Ref Range   Hemoglobin 12.9 12.0 - 15.0 g/dL  Glucose, capillary     Status: Abnormal   Collection Time: 01/16/21  6:21 AM  Result Value Ref Range   Glucose-Capillary 106 (H) 70 - 99 mg/dL     Treatments: surgery: robotic assisted laparoscopic hysterectomy   Discharge Exam: Blood pressure (P) 106/74, pulse (P) 96, temperature (P) 98.3 F (36.8 C), resp. rate (P) 18, height 5\' 7"  (1.702 m), weight 101.1 kg, last menstrual period 12/27/2020, SpO2 (P) 98 %. General appearance: alert, cooperative, and no distress Resp: no distress  GI: soft appropriately tender nondistended  Extremities: extremities normal, atraumatic, no cyanosis or edema  Disposition: Discharge disposition: 01-Home or Self Care       Discharge Instructions     Call MD for:  persistant nausea and vomiting   Complete by: As directed  Call MD for:  redness, tenderness, or signs of infection (pain, swelling, redness, odor or green/yellow discharge around incision site)   Complete by: As directed    Call MD for:  severe uncontrolled pain   Complete by: As directed    Call MD for:  temperature >100.4   Complete by: As directed    Diet - low sodium heart healthy   Complete by: As directed    Diet Carb Modified   Complete by: As directed    Driving Restrictions   Complete by: As directed    Avoid driving for 1 week   Increase activity slowly   Complete by: As directed    Lifting restrictions   Complete by: As directed    Avoid lifting over 10 lbs   May shower / Bathe   Complete by: As directed    May walk up steps   Complete by: As directed    No wound care   Complete by: As directed     Sexual Activity Restrictions   Complete by: As directed    Avoid sex for 6 -8 weeks and until approved by Dr. Landry Mellow      Allergies as of 01/16/2021       Reactions   Lamictal [lamotrigine] Other (See Comments)   Hallucination   Topamax [topiramate] Swelling   Nickel Rash   Wool Alcohol [lanolin Alcohol] Itching        Medication List     TAKE these medications    Accu-Chek FastClix Lancets Misc   Accu-Chek Guide test strip Generic drug: glucose blood   acetaminophen 500 MG tablet Commonly known as: TYLENOL Take 2 tablets (1,000 mg total) by mouth every 8 (eight) hours as needed for mild pain or moderate pain. Notes to patient: Can take next dose at 11am, 5pm, 63FH   bismuth subsalicylate 545 MG chewable tablet Commonly known as: PEPTO BISMOL Chew 524 mg by mouth daily as needed for indigestion.   glimepiride 4 MG tablet Commonly known as: AMARYL Take 4 mg by mouth at bedtime.   hydrOXYzine 50 MG capsule Commonly known as: VISTARIL Take 1 capsule (50 mg total) by mouth at bedtime.   ibuprofen 600 MG tablet Commonly known as: ADVIL Take 1 tablet (600 mg total) by mouth every 6 (six) hours as needed. Notes to patient: Can take next dose at 12pm, 6pm, 12am   losartan 25 MG tablet Commonly known as: COZAAR Take 25 mg by mouth every evening.   lurasidone 80 MG Tabs tablet Commonly known as: Latuda Take 1 tablet (80 mg total) by mouth daily with breakfast. What changed: when to take this   metFORMIN 500 MG 24 hr tablet Commonly known as: GLUCOPHAGE-XR 1,000 mg every evening.   oxyCODONE 5 MG immediate release tablet Commonly known as: Oxy IR/ROXICODONE Take 1-2 tablets (5-10 mg total) by mouth every 4 (four) hours as needed for moderate pain. Notes to patient: Can take next dose at 12pm, 4pm, 8pm, 12am   pantoprazole 20 MG tablet Commonly known as: PROTONIX Take 40 mg by mouth 2 (two) times daily. 2 at evening meal and 2 at bedtime   rosuvastatin 5 MG  tablet Commonly known as: CRESTOR Take 5 mg by mouth every other day. Qod in evening        Follow-up Information     Christophe Louis, MD. Go in 2 week(s).   Specialty: Obstetrics and Gynecology Contact information: [address] [place] [address] [phone]  Signed: Christophe Louis 01/16/2021, 8:55 AM

## 2021-03-05 ENCOUNTER — Telehealth (HOSPITAL_BASED_OUTPATIENT_CLINIC_OR_DEPARTMENT_OTHER): Payer: Medicaid Other | Admitting: Psychiatry

## 2021-03-05 ENCOUNTER — Encounter (HOSPITAL_COMMUNITY): Payer: Self-pay | Admitting: Psychiatry

## 2021-03-05 ENCOUNTER — Other Ambulatory Visit: Payer: Self-pay

## 2021-03-05 DIAGNOSIS — F3160 Bipolar disorder, current episode mixed, unspecified: Secondary | ICD-10-CM | POA: Diagnosis not present

## 2021-03-05 DIAGNOSIS — F419 Anxiety disorder, unspecified: Secondary | ICD-10-CM

## 2021-03-05 MED ORDER — HYDROXYZINE PAMOATE 50 MG PO CAPS: 50.0000 mg | ORAL_CAPSULE | Freq: Every day | ORAL | 2 refills | Status: DC

## 2021-03-05 MED ORDER — LURASIDONE HCL 80 MG PO TABS: 80.0000 mg | ORAL_TABLET | Freq: Every day | ORAL | 2 refills | Status: DC

## 2021-03-05 NOTE — Progress Notes (Signed)
Virtual Visit via Telephone Note  I connected with Vicki Huynh on 03/05/21 at  2:40 PM EST by telephone and verified that I am speaking with the correct person using two identifiers.  Location: Patient: Home Provider: Home Office   I discussed the limitations, risks, security and privacy concerns of performing an evaluation and management service by telephone and the availability of in person appointments. I also discussed with the patient that there may be a patient responsible charge related to this service. The patient expressed understanding and agreed to proceed.   History of Present Illness: Patient is evaluated by phone session.  She recently had a hysterectomy and recovering from the procedure.  She endorsed sometimes tired and fatigued but mentally she is doing well.  She had a sleep study and find out that she has mild apnea but does not require CPAP machine.  There are nights when she struggle with all night sleep but hydroxyzine helps sleep a few hours.  She denies any panic attack, crying spells, mania, psychosis or any hallucination.  She is very happy because her diabetes is much better and her last hemoglobin A1c was 6.4.  Her diabetes is managed by Kathyrn Lass at [place].  She has changed her diet and that had helped her control of the diabetes.  Her family life is good.  She lives with her husband and 2 kids.  Patient told recently that they have COVID but now they are recovering from symptoms.  Patient told her plan is to spend Christmas with the family.  Her father lives next door.  Past Psychiatric History:  H/O depression, mood swings, irritability, mania and impulsive behavior.  PCP prescribed Effexor in 2012 worked for a while. Tried Lamictal (hallucination), Buspar (groggy) . H/O ETOH and THC but sober since 2009.  No h/o inpatient or any suicidal attempt.    Psychiatric Specialty Exam: Physical Exam  Review of Systems  Weight 224 lb (101.6 kg).There is no  height or weight on file to calculate BMI.  General Appearance: NA  Eye Contact:  NA  Speech:  Clear and Coherent and Normal Rate  Volume:  Normal  Mood:   fatigue  Affect:  NA  Thought Process:  Goal Directed  Orientation:  Full (Time, Place, and Person)  Thought Content:  WDL  Suicidal Thoughts:  No  Homicidal Thoughts:  No  Memory:  Immediate;   Good Recent;   Good Remote;   Good  Judgement:  Intact  Insight:  Present  Psychomotor Activity:  NA  Concentration:  Concentration: Good and Attention Span: Good  Recall:  Good  Fund of Knowledge:  Good  Language:  Good  Akathisia:  No  Handed:  Right  AIMS (if indicated):     Assets:  Communication Skills Desire for Improvement Housing Resilience Social Support Talents/Skills Transportation  ADL's:  Intact  Cognition:  WNL  Sleep:   fair      Assessment and Plan: Bipolar disorder type I.  Anxiety.  Patient recently had hysterectomy which was planned and she is recovering from the procedure.  She also had a COVID but symptoms getting better.  Overall she thinks symptoms are manageable and she would like to keep the current medicine to help her mania and anxiety.  Continue Latuda 80 mg daily and hydroxyzine 50 mg at bedtime.  She had mild apnea and does not require CPAP machine.  Recommended to call us back if there is any question or any concern.  Follow-up in 3 months.  Follow Up Instructions:    I discussed the assessment and treatment plan with the patient. The patient was provided an opportunity to ask questions and all were answered. The patient agreed with the plan and demonstrated an understanding of the instructions.   The patient was advised to call back or seek an in-person evaluation if the symptoms worsen or if the condition fails to improve as anticipated.  I provided 17 minutes of non-face-to-face time during this encounter.   [name], MD

## 2021-04-22 ENCOUNTER — Telehealth (HOSPITAL_COMMUNITY): Payer: Self-pay | Admitting: *Deleted

## 2021-04-22 NOTE — Telephone Encounter (Signed)
PA for Latuda 60 mg approved via CoverMyMeds from 04/22/21 thru 04/22/22.   PA Case # [id number].

## 2021-05-27 ENCOUNTER — Other Ambulatory Visit: Payer: Self-pay

## 2021-05-27 ENCOUNTER — Telehealth (HOSPITAL_BASED_OUTPATIENT_CLINIC_OR_DEPARTMENT_OTHER): Payer: Medicaid Other | Admitting: Psychiatry

## 2021-05-27 ENCOUNTER — Encounter (HOSPITAL_COMMUNITY): Payer: Self-pay | Admitting: Psychiatry

## 2021-05-27 DIAGNOSIS — F3160 Bipolar disorder, current episode mixed, unspecified: Secondary | ICD-10-CM

## 2021-05-27 DIAGNOSIS — F419 Anxiety disorder, unspecified: Secondary | ICD-10-CM | POA: Diagnosis not present

## 2021-05-27 MED ORDER — HYDROXYZINE PAMOATE 50 MG PO CAPS: 50.0000 mg | ORAL_CAPSULE | Freq: Every day | ORAL | 2 refills | Status: DC

## 2021-05-27 MED ORDER — LURASIDONE HCL 80 MG PO TABS: 80.0000 mg | ORAL_TABLET | Freq: Every day | ORAL | 2 refills | Status: DC

## 2021-05-27 NOTE — Progress Notes (Signed)
Virtual Visit via Telephone Note  I connected with Vicki Huynh on 05/27/21 at  4:00 PM EST by telephone and verified that I am speaking with the correct person using two identifiers.  Location: Patient: Home Provider: Home Office   I discussed the limitations, risks, security and privacy concerns of performing an evaluation and management service by telephone and the availability of in person appointments. I also discussed with the patient that there may be a patient responsible charge related to this service. The patient expressed understanding and agreed to proceed.   History of Present Illness: Patient is evaluated by phone session.  She is taking all her medication as prescribed.  Her sleep remains sometimes not as good.  She has given the diagnosis of mild apnea but she does not require CPAP.  She admitted 3 pounds weight gain because she is not exercising and eating more than usual.  However her diabetes is managed.  She sees Dr. Kathyrn Lass at [place].  She denies any mania, psychosis or any hallucination.  She denies any major panic attack.  She has no tremors or shakes.  She takes the hydroxyzine at bedtime.  She lives with her husband and her 2 children who are going to be 18 and 65 years old.  She is busy taking care of the family.  She denies any hallucination or any suicidal thoughts.  She like to keep the current medication.  Past Psychiatric History:  H/O depression, mood swings, irritability, mania and impulsive behavior.  PCP prescribed Effexor in 2012 worked for a while. Tried Lamictal (hallucination), Buspar (groggy) . H/O ETOH and THC but sober since 2009.  No h/o inpatient or any suicidal attempt.    Psychiatric Specialty Exam: Physical Exam  Review of Systems  Weight 233 lb (105.7 kg).There is no height or weight on file to calculate BMI.  General Appearance: NA  Eye Contact:  NA  Speech:  Clear and Coherent  Volume:  Normal  Mood:  Euthymic  Affect:  NA   Thought Process:  Goal Directed  Orientation:  Full (Time, Place, and Person)  Thought Content:  Logical  Suicidal Thoughts:  No  Homicidal Thoughts:  No  Memory:  Immediate;   Good Recent;   Good Remote;   Good  Judgement:  Intact  Insight:  Present  Psychomotor Activity:  NA  Concentration:  Concentration: Good and Attention Span: Good  Recall:  Good  Fund of Knowledge:  Good  Language:  Good  Akathisia:  No  Handed:  Right  AIMS (if indicated):     Assets:  Communication Skills Desire for Improvement Housing Resilience Social Support Transportation  ADL's:  Intact  Cognition:  WNL  Sleep:   fair. Had mild apnea but do not required CPAP      Assessment and Plan: Bipolar disorder type I.  Anxiety.  Patient mood is a stable but is still there are times when she has insomnia.  Her mild apnea does not require CPAP but she is going to talk to the physician if something else can be done to help with sleep.  She like to keep the current medication.  She has no concerns or side effects.  Continue Latuda 80 mg daily and hydroxyzine 50 mg at bedtime.  We talk about watching her calorie intake and exercise.  Patient promised to work on it.  Recommended to call us back if she is any question or any concern.  Follow-up in 3 months.  Follow  Up Instructions:    I discussed the assessment and treatment plan with the patient. The patient was provided an opportunity to ask questions and all were answered. The patient agreed with the plan and demonstrated an understanding of the instructions.   The patient was advised to call back or seek an in-person evaluation if the symptoms worsen or if the condition fails to improve as anticipated.  I provided 19 minutes of non-face-to-face time during this encounter.   [name], MD

## 2021-06-04 ENCOUNTER — Telehealth (HOSPITAL_COMMUNITY): Payer: Self-pay | Admitting: *Deleted

## 2021-06-04 NOTE — Telephone Encounter (Signed)
Opened in error

## 2021-07-24 IMAGING — CR DG CHEST 2V
2 series · 2 of 2 positions shown · non-contrast
Comparison: 05/01/2016

CLINICAL DATA: Chest pain

EXAM:
CHEST - 2 VIEW

[w chest pa]
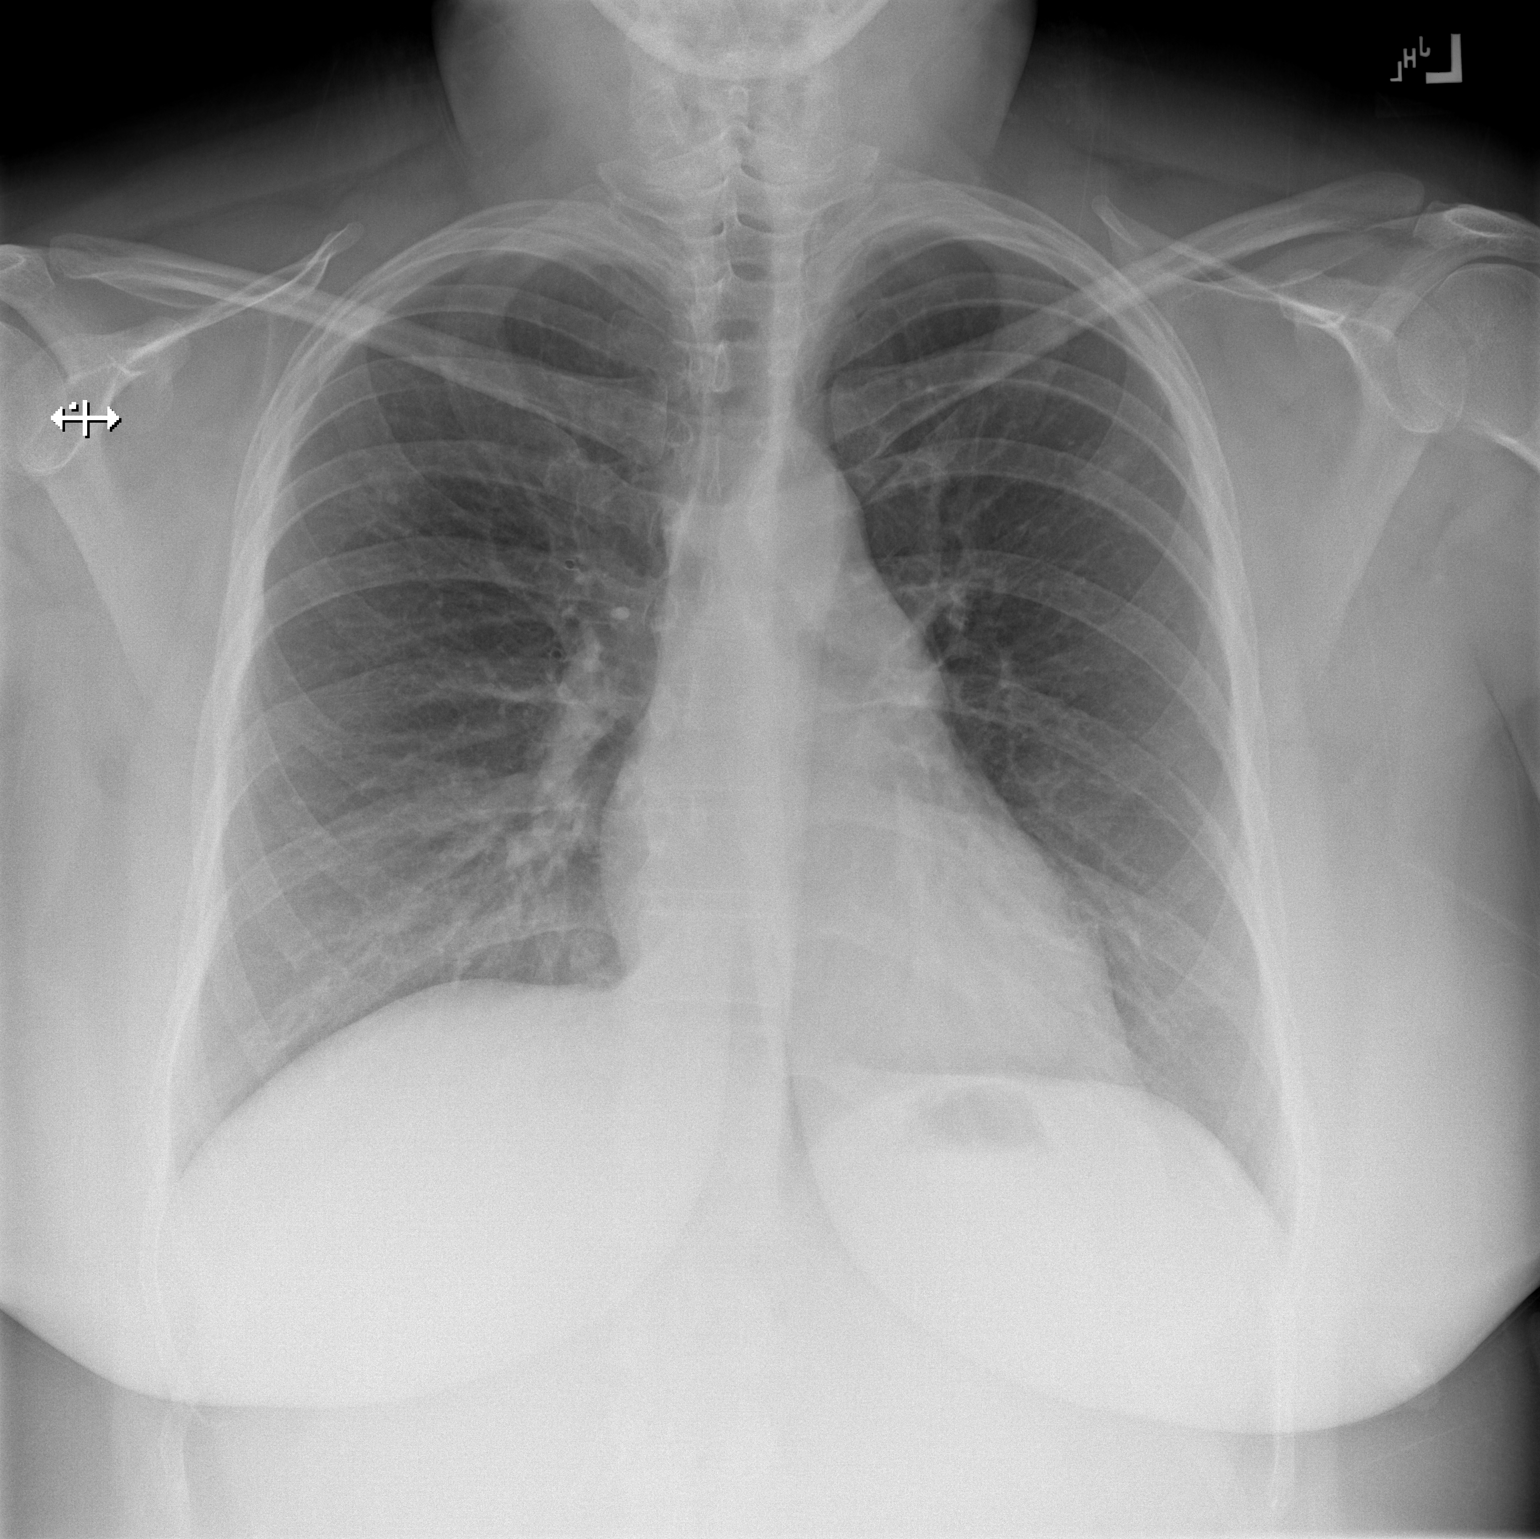

[w chest lat]
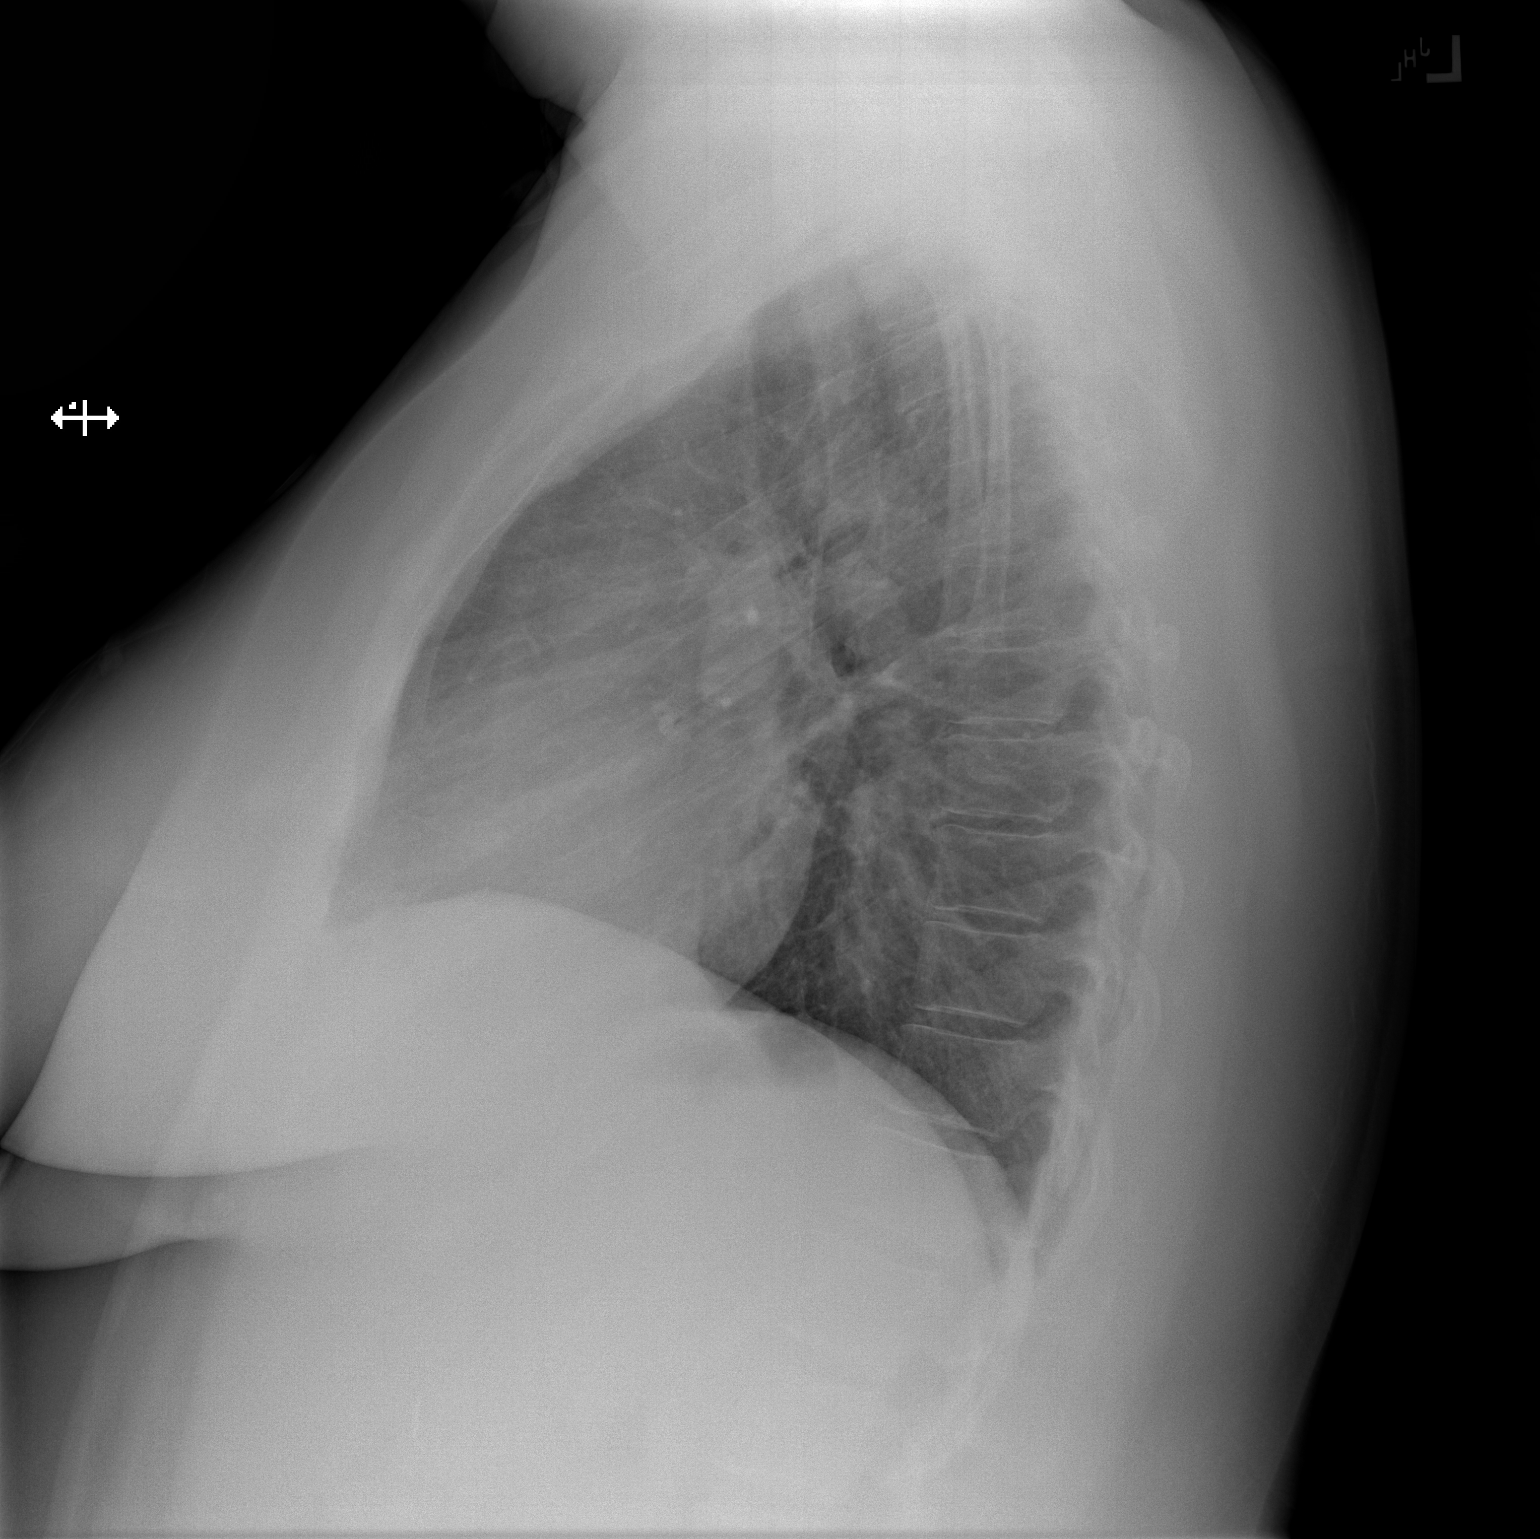

[2 of 2 positions shown; findings below may reference images not displayed]

FINDINGS: The heart size and mediastinal contours are within normal limits.
Both lungs are clear. The visualized skeletal structures are
unremarkable.
IMPRESSION: No active cardiopulmonary disease.

## 2021-08-20 ENCOUNTER — Other Ambulatory Visit (HOSPITAL_COMMUNITY): Payer: Self-pay | Admitting: Psychiatry

## 2021-08-20 DIAGNOSIS — F419 Anxiety disorder, unspecified: Secondary | ICD-10-CM

## 2021-08-27 ENCOUNTER — Telehealth (HOSPITAL_COMMUNITY): Payer: Medicaid Other | Admitting: Psychiatry

## 2021-08-27 ENCOUNTER — Telehealth (HOSPITAL_BASED_OUTPATIENT_CLINIC_OR_DEPARTMENT_OTHER): Payer: Medicaid Other | Admitting: Psychiatry

## 2021-08-27 ENCOUNTER — Encounter (HOSPITAL_COMMUNITY): Payer: Self-pay | Admitting: Psychiatry

## 2021-08-27 VITALS — Wt 228.0 lb

## 2021-08-27 DIAGNOSIS — F4321 Adjustment disorder with depressed mood: Secondary | ICD-10-CM | POA: Diagnosis not present

## 2021-08-27 DIAGNOSIS — F419 Anxiety disorder, unspecified: Secondary | ICD-10-CM

## 2021-08-27 DIAGNOSIS — F3160 Bipolar disorder, current episode mixed, unspecified: Secondary | ICD-10-CM | POA: Diagnosis not present

## 2021-08-27 MED ORDER — LURASIDONE HCL 80 MG PO TABS: 80.0000 mg | ORAL_TABLET | Freq: Every day | ORAL | 2 refills | Status: DC

## 2021-08-27 MED ORDER — HYDROXYZINE PAMOATE 50 MG PO CAPS: 50.0000 mg | ORAL_CAPSULE | Freq: Every day | ORAL | 2 refills | Status: DC

## 2021-08-27 NOTE — Progress Notes (Signed)
Virtual Visit via Telephone Note  I connected with [facility] on 08/27/21 at  2:20 PM EDT by telephone and verified that I am speaking with the correct person using two identifiers.  Location: Patient: Home Provider: Home Office   I discussed the limitations, risks, security and privacy concerns of performing an evaluation and management service by telephone and the availability of in person appointments. I also discussed with the patient that there may be a patient responsible charge related to this service. The patient expressed understanding and agreed to proceed.   History of Present Illness: Patient is evaluated by phone session.  She is sad and dysphoric because her father died last month.  Patient told he was diagnosed with lung cancer which spread very fast and died due to kidney failure.  She admitted sometime crying spells, missing him because he was living next door.  She had good support from her friends, family.  Patient told her 2 kids are very attached to patient's father and that is devastating for that.  She admitted sometime not sleeping well and thinking about her father.  She is now also thinking about getting a job to keep herself busy.  She takes hydroxyzine at bedtime which usually works very well but lately not helping as much.  She denies any mania, psychosis, highs and lows or any suicidal thoughts.  Her husband is very supportive.  Her energy level is low.  Her appetite is fair.  She lost 3 pounds but also trying to lose weight.  She denies any hallucination or any suicidal thoughts.   Past Psychiatric History:  H/O depression, mood swings, irritability, mania and impulsive behavior.  PCP prescribed Effexor in 2012 worked for a while. Tried Lamictal (hallucination), Buspar (groggy) . H/O ETOH and THC but sober since 2009.  No h/o inpatient or any suicidal attempt.     Psychiatric Specialty Exam: Physical Exam  Review of Systems  Weight 228 lb (103.4 kg).There is  no height or weight on file to calculate BMI.  General Appearance: NA  Eye Contact:  NA  Speech:  Slow  Volume:  Normal  Mood:  Dysphoric  Affect:  NA  Thought Process:  Goal Directed  Orientation:  Full (Time, Place, and Person)  Thought Content:  Rumination  Suicidal Thoughts:  No  Homicidal Thoughts:  No  Memory:  Immediate;   Good Recent;   Good Remote;   Good  Judgement:  Good  Insight:  Good  Psychomotor Activity:  Normal  Concentration:  Concentration: Fair and Attention Span: Fair  Recall:  Good  Fund of Knowledge:  Good  Language:  Good  Akathisia:  No  Handed:  Right  AIMS (if indicated):     Assets:  Communication Skills Desire for Improvement Housing Social Support  ADL's:  Intact  Cognition:  WNL  Sleep:   fair      Assessment and Plan: Bipolar disorder type I.  Anxiety.  Grief.  Discussed recent loss of her father who diagnosed with cancer and died suddenly.  I encouraged contact hospice and consider low-dose melatonin 1 to 3 mg to help with sleep.  She is also looking actively to find a job to keep herself busy and help pay bills.  She feels her mania is stable and like to keep the Latuda 80 mg daily and hydroxyzine 50 mg at bedtime.  She is to see [name] but has not seen in a while.  She admitted we will consider going  back to counseling if needed.  I also encouraged to call us back if she need more information about hospice counseling.  Patient agreed with the plan.  Continue Latuda 80 mg daily and hydroxyzine 50 mg at bedtime.  Follow Up Instructions:    I discussed the assessment and treatment plan with the patient. The patient was provided an opportunity to ask questions and all were answered. The patient agreed with the plan and demonstrated an understanding of the instructions.   The patient was advised to call back or seek an in-person evaluation if the symptoms worsen or if the condition fails to improve as anticipated.  Collaboration of  Care: Primary Care Provider AEB notes are available in epic to review.  Patient/Guardian was advised Release of Information must be obtained prior to any record release in order to collaborate their care with an outside provider. Patient/Guardian was advised if they have not already done so to contact the registration department to sign all necessary forms in order for [place] to release information regarding their care.   Consent: Patient/Guardian gives verbal consent for treatment and assignment of benefits for services provided during this visit. Patient/Guardian expressed understanding and agreed to proceed.    I provided 20 minutes of non-face-to-face time during this encounter.   [name], MD

## 2021-10-22 ENCOUNTER — Other Ambulatory Visit: Payer: Self-pay | Admitting: Family Medicine

## 2021-10-22 DIAGNOSIS — Z1231 Encounter for screening mammogram for malignant neoplasm of breast: Secondary | ICD-10-CM

## 2021-10-23 ENCOUNTER — Telehealth (HOSPITAL_COMMUNITY): Payer: Self-pay | Admitting: *Deleted

## 2021-10-23 ENCOUNTER — Other Ambulatory Visit (HOSPITAL_COMMUNITY): Payer: Self-pay | Admitting: Psychiatry

## 2021-10-23 MED ORDER — CLONAZEPAM 0.5 MG PO TABS: 0.5000 mg | ORAL_TABLET | Freq: Two times a day (BID) | ORAL | 0 refills | Status: DC

## 2021-10-23 NOTE — Telephone Encounter (Signed)
Dr. Adele Schilder pt who called requesting "something" for anxiety. Pt has h/o Bi-Polar 1 and is currently taking Latuda 80 mg and has Rx for Vistaril 50 mg QHS, however pt states that the Vistaril makes her too groggy the next day. Pt states that she's not sleeping and just feeling very anxious during the day as well. Pt has an appointment upcoming on 11/27/21. Please review and advise. Thanks.

## 2021-10-23 NOTE — Telephone Encounter (Signed)
Spoke to pt, clonazepam 0.5 mg bid #15 sent in. Please try to get her in next week

## 2021-10-30 ENCOUNTER — Telehealth (HOSPITAL_BASED_OUTPATIENT_CLINIC_OR_DEPARTMENT_OTHER): Payer: Medicaid Other | Admitting: Psychiatry

## 2021-10-30 ENCOUNTER — Encounter (HOSPITAL_COMMUNITY): Payer: Self-pay | Admitting: Psychiatry

## 2021-10-30 VITALS — Wt 228.0 lb

## 2021-10-30 DIAGNOSIS — F419 Anxiety disorder, unspecified: Secondary | ICD-10-CM | POA: Diagnosis not present

## 2021-10-30 DIAGNOSIS — F3160 Bipolar disorder, current episode mixed, unspecified: Secondary | ICD-10-CM

## 2021-10-30 MED ORDER — CLONAZEPAM 0.5 MG PO TABS: 0.2500 mg | ORAL_TABLET | Freq: Every day | ORAL | 0 refills | Status: DC | PRN

## 2021-10-30 MED ORDER — LURASIDONE HCL 80 MG PO TABS: 80.0000 mg | ORAL_TABLET | Freq: Every day | ORAL | 2 refills | Status: DC

## 2021-10-30 NOTE — Progress Notes (Signed)
Virtual Visit via Telephone Note  I connected with Vicki Huynh on 10/30/21 at 11:00 AM EDT by telephone and verified that I am speaking with the correct person using two identifiers.  Location: Patient: Home  Provider: Home Office   I discussed the limitations, risks, security and privacy concerns of performing an evaluation and management service by telephone and the availability of in person appointments. I also discussed with the patient that there may be a patient responsible charge related to this service. The patient expressed understanding and agreed to proceed.   History of Present Illness: Patient is evaluated by phone session.  She is slowly and gradually getting better.  She was very sad after the loss of father who died 3 months ago.  She is relieved that has been able to get a job on [employer] and money coming now.  She also applying for part-time job but so far she has not luck.  She is not taking hydroxyzine because it was making her too groggy.  Recently covering physician Dr. Harrington Challenger has provided Klonopin which she takes half to 1 tablet as needed which is working very well for her anxiety.  She denies any panic attack.  She is taking [place].  Recently she had blood work and her hemoglobin A1c is jumped from 6.4-8.6.  She is now watching her appetite, start walking.  She admitted that she was drinking a lot of sweet juices.  Patient denies any impulsive behavior recently and denies any excessive spending.  She reported her mania and highs and lows are stable.  She denies any suicidal thoughts.  She like to keep the [place] as needed.    Past Psychiatric History:  H/O depression, mood swings, irritability, mania and impulsive behavior.  PCP prescribed Effexor in 2012 worked for a while. Tried Lamictal (hallucination), Buspar (groggy) . H/O ETOH and THC but sober since 2009.  No h/o inpatient or any suicidal attempt.     Psychiatric Specialty Exam: Physical Exam  Review of  Systems  Weight 228 lb (103.4 kg).There is no height or weight on file to calculate BMI.  General Appearance: NA  Eye Contact:  NA  Speech:  Clear and Coherent and Normal Rate  Volume:  Normal  Mood:  Dysphoric  Affect:  NA  Thought Process:  Goal Directed  Orientation:  Full (Time, Place, and Person)  Thought Content:  Rumination  Suicidal Thoughts:  No  Homicidal Thoughts:  No  Memory:  Immediate;   Good Recent;   Good Remote;   Good  Judgement:  Fair  Insight:  Present  Psychomotor Activity:  NA  Concentration:  Concentration: Fair and Attention Span: Fair  Recall:  Good  Fund of Knowledge:  Good  Language:  Good  Akathisia:  No  Handed:  Right  AIMS (if indicated):     Assets:  Communication Skills Desire for Improvement Housing Resilience Social Support  ADL's:  Intact  Cognition:  WNL  Sleep:   better      Assessment and Plan: Bipolar disorder type I.  Grief.  Patient now taking Klonopin 0.5 mg half tablet to 1 tablet as needed for anxiety.  It was given by Dr. Harrington Challenger.  I explained Klonopin is a benzodiazepine and she need to be very careful about dependency, tolerance and withdrawal.  Patient agreed to take it only as needed when she has anxiety.  She like to keep the [place] which is working very well.  She is no longer  taking hydroxyzine.  She is not interested in going back to therapy with [name] since she is feeling much better.  Discussed medication side effects and benefits.  Recommended to call us back if she is any question or any concern.  Follow-up in 3 months.  Follow Up Instructions:    I discussed the assessment and treatment plan with the patient. The patient was provided an opportunity to ask questions and all were answered. The patient agreed with the plan and demonstrated an understanding of the instructions.   The patient was advised to call back or seek an in-person evaluation if the symptoms worsen or if the condition fails to improve as  anticipated.  Collaboration of Care: Primary Care Provider AEB notes are available in epic to review.  Patient/Guardian was advised Release of Information must be obtained prior to any record release in order to collaborate their care with an outside provider. Patient/Guardian was advised if they have not already done so to contact the registration department to sign all necessary forms in order for [place] to release information regarding their care.   Consent: Patient/Guardian gives verbal consent for treatment and assignment of benefits for services provided during this visit. Patient/Guardian expressed understanding and agreed to proceed.    I provided 21 minutes of non-face-to-face time during this encounter.   [name], MD

## 2021-10-31 ENCOUNTER — Ambulatory Visit
Admission: RE | Admit: 2021-10-31 | Discharge: 2021-10-31 | Disposition: A | Discharge status: Home / Self Care | Payer: Medicaid Other | Source: Ambulatory Visit | Attending: Family Medicine | Admitting: Family Medicine

## 2021-10-31 DIAGNOSIS — Z1231 Encounter for screening mammogram for malignant neoplasm of breast: Secondary | ICD-10-CM

## 2021-11-26 ENCOUNTER — Telehealth (HOSPITAL_COMMUNITY): Payer: Medicaid Other | Admitting: Psychiatry

## 2021-11-27 ENCOUNTER — Telehealth (HOSPITAL_COMMUNITY): Payer: Medicaid Other | Admitting: Psychiatry

## 2021-12-18 ENCOUNTER — Telehealth (HOSPITAL_COMMUNITY): Payer: Self-pay | Admitting: *Deleted

## 2021-12-18 ENCOUNTER — Other Ambulatory Visit (HOSPITAL_COMMUNITY): Payer: Self-pay | Admitting: Psychiatry

## 2021-12-18 DIAGNOSIS — F419 Anxiety disorder, unspecified: Secondary | ICD-10-CM

## 2021-12-18 DIAGNOSIS — F3160 Bipolar disorder, current episode mixed, unspecified: Secondary | ICD-10-CM

## 2021-12-18 MED ORDER — CLONAZEPAM 0.5 MG PO TABS: 0.5000 mg | ORAL_TABLET | Freq: Every day | ORAL | 0 refills | Status: DC | PRN

## 2021-12-18 NOTE — Telephone Encounter (Signed)
sent 

## 2021-12-18 NOTE — Telephone Encounter (Signed)
Pt of Dr. Adele Schilder called requesting a refill of Klonopin 0.5 mg. Last Rx sent on 10/30/21 for #15 SIG is to take 1/2 (0.25 mg) QD PRN for anxiety. Pt is stating that 1/2 a tablet is not really working and would like enough to be able to take a whole tablet (0.5 mg) QD PRN. Pt next appointment is scheduled for 02/09/22. Please review and advise,

## 2022-01-01 ENCOUNTER — Encounter (HOSPITAL_COMMUNITY): Payer: Self-pay | Admitting: Emergency Medicine

## 2022-01-01 ENCOUNTER — Emergency Department (HOSPITAL_COMMUNITY): Payer: Medicaid Other

## 2022-01-01 ENCOUNTER — Emergency Department (HOSPITAL_COMMUNITY)
Admission: EM | Admit: 2022-01-01 | Discharge: 2022-01-01 | Discharge status: Against Medical Advice | Payer: Medicaid Other | Attending: Emergency Medicine | Admitting: Emergency Medicine

## 2022-01-01 ENCOUNTER — Other Ambulatory Visit: Payer: Self-pay

## 2022-01-01 DIAGNOSIS — E119 Type 2 diabetes mellitus without complications: Secondary | ICD-10-CM | POA: Insufficient documentation

## 2022-01-01 DIAGNOSIS — R200 Anesthesia of skin: Secondary | ICD-10-CM | POA: Insufficient documentation

## 2022-01-01 DIAGNOSIS — R079 Chest pain, unspecified: Secondary | ICD-10-CM | POA: Insufficient documentation

## 2022-01-01 LAB — BASIC METABOLIC PANEL
Anion gap: 7 (ref 5–15)
BUN: 12 mg/dL (ref 6–20)
CO2: 24 mmol/L (ref 22–32)
Calcium: 9.2 mg/dL (ref 8.9–10.3)
Chloride: 107 mmol/L (ref 98–111)
Creatinine, Ser: 0.69 mg/dL (ref 0.44–1.00)
GFR, Estimated: >60 mL/min (ref >60)
Glucose, Bld: 176 mg/dL — ABNORMAL HIGH (ref 70–99)
Potassium: 3.6 mmol/L (ref 3.5–5.1)
Sodium: 138 mmol/L (ref 135–145)

## 2022-01-01 LAB — CBC
HCT: 39.6 % (ref 36.0–46.0)
Hemoglobin: 13.7 g/dL (ref 12.0–15.0)
MCH: 29.7 pg (ref 26.0–34.0)
MCHC: 34.6 g/dL (ref 30.0–36.0)
MCV: 85.9 fL (ref 80.0–100.0)
Platelets: 335 10*3/uL (ref 150–400)
RBC: 4.61 MIL/uL (ref 3.87–5.11)
RDW: 12.5 % (ref 11.5–15.5)
WBC: 7.8 10*3/uL (ref 4.0–10.5)
nRBC: 0 % (ref 0.0–0.2)

## 2022-01-01 LAB — I-STAT BETA HCG BLOOD, ED (MC, WL, AP ONLY): I-stat hCG, quantitative: <5 m[IU]/mL (ref <5)

## 2022-01-01 LAB — TROPONIN I (HIGH SENSITIVITY): Troponin I (High Sensitivity): <2 ng/L (ref <18)

## 2022-01-01 NOTE — ED Triage Notes (Signed)
Pt presents with intermittent central non radiating chest pain for a couple of days.  About 30 min PTA noted lip and tongue "numbness" no swelling. No airway or breathing difficulty. Able to swallow without difficulty. No known exposures to anything new in her environment.

## 2022-01-01 NOTE — ED Provider Triage Note (Signed)
Emergency Medicine Provider Triage Evaluation Note  Vicki Huynh , a 41 y.o. female  was evaluated in triage.  Pt complains of cp. Endorse tingling sensation to tongue and lip with pressure sensation to chest which started an hr ago.  No fever, chills, cough, focal weakness, headache, sob.  Endorse increased stress.  Hx of DM  Review of Systems  Positive: As above Negative: As above  Physical Exam  BP (!) 142/97 (BP Location: Left Arm)   Pulse 80   Temp 98.2 F (36.8 C) (Oral)   Resp 18   LMP 12/27/2020   SpO2 97%  Gen:   Awake, no distress   Resp:  Normal effort  MSK:   Moves extremities without difficulty  Other:    Medical Decision Making  Medically screening exam initiated at 6:24 PM.  Appropriate orders placed.  Felcia L Landi was informed that the remainder of the evaluation will be completed by another provider, this initial triage assessment does not replace that evaluation, and the importance of remaining in the ED until their evaluation is complete.     Domenic Moras, PA-C 01/01/22 1825

## 2022-01-29 ENCOUNTER — Encounter (HOSPITAL_COMMUNITY): Payer: Self-pay | Admitting: Psychiatry

## 2022-01-29 ENCOUNTER — Telehealth (HOSPITAL_BASED_OUTPATIENT_CLINIC_OR_DEPARTMENT_OTHER): Payer: Medicaid Other | Admitting: Psychiatry

## 2022-01-29 VITALS — Wt 220.0 lb

## 2022-01-29 DIAGNOSIS — F3160 Bipolar disorder, current episode mixed, unspecified: Secondary | ICD-10-CM

## 2022-01-29 DIAGNOSIS — F419 Anxiety disorder, unspecified: Secondary | ICD-10-CM | POA: Diagnosis not present

## 2022-01-29 MED ORDER — LURASIDONE HCL 80 MG PO TABS: 80.0000 mg | ORAL_TABLET | Freq: Every day | ORAL | 2 refills | Status: DC

## 2022-01-29 NOTE — Progress Notes (Signed)
Virtual Visit via Telephone Note  I connected with [place] on 01/29/22 at 11:00 AM EDT by telephone and verified that I am speaking with the correct person using two identifiers.  Location: Patient: Home  Provider: Home Office   I discussed the limitations, risks, security and privacy concerns of performing an evaluation and management service by telephone and the availability of in person appointments. I also discussed with the patient that there may be a patient responsible charge related to this service. The patient expressed understanding and agreed to proceed.   History of Present Illness: Patient is evaluated by phone session.  She is taking [place] which is helping her mood swing, mania, impulsive behavior.  She is still very stressed about her financial challenges.  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  Her husband also not able to work because of colitis.  Patient is thinking to consider applying disability for herself and for her husband.  The only income she is getting from [place] for 1 year old son who has autism and 31 year old son child support from a previous relationship.  She is not sure if her anxiety will qualify to get the disability.  Recently her brother died in a motor vehicle accident when he was driving motorcycle at night.  He was living in [place].  Patient feels guilty because she was not in good terms with the brother and regret that she should had talked to him before he died.  She denies any crying spells, suicidal thoughts, hallucination.  She is using CPAP which is helping her sleep and has taken few times Klonopin.  She is trying to lose weight and since started Trulicity and watching her calorie intake she had loss more than 8 pounds in past few months.  She does not feel she need a therapist because she is able to manage her anxiety.  She has no tremors, shakes or any EPS.  She is not  drinking or using any illegal substances.  Recently she had a blood work.   Past Psychiatric History:  H/O depression, mood swings, irritability, mania and impulsive behavior.  PCP prescribed Effexor in 2012 worked for a while. Tried Lamictal (hallucination), Buspar (groggy) . H/O ETOH and THC but sober since 2009.  No h/o inpatient or any suicidal attempt.    Recent Results (from the past 2160 hour(s))  Basic metabolic panel     Status: Abnormal   Collection Time: 01/01/22  6:39 PM  Result Value Ref Range   Sodium 138 135 - 145 mmol/L   Potassium 3.6 3.5 - 5.1 mmol/L   Chloride 107 98 - 111 mmol/L   CO2 24 22 - 32 mmol/L   Glucose, Bld 176 (H) 70 - 99 mg/dL    Comment: Glucose reference range applies only to samples taken after fasting for at least 8 hours.   BUN 12 6 - 20 mg/dL   Creatinine, Ser 0.69 0.44 - 1.00 mg/dL   Calcium 9.2 8.9 - 10.3 mg/dL   GFR, Estimated >60 >60 mL/min    Comment: (NOTE) Calculated using the CKD-EPI Creatinine Equation (2021)    Anion gap 7 5 - 15    Comment: Performed at [facility], [place] [address] [place], [place] [address]  CBC     Status: None   Collection Time: 01/01/22  6:39 PM  Result Value Ref Range   WBC 7.8  4.0 - 10.5 K/uL   RBC 4.61 3.87 - 5.11 MIL/uL   Hemoglobin 13.7 12.0 - 15.0 g/dL   HCT 39.6 36.0 - 46.0 %   MCV 85.9 80.0 - 100.0 fL   MCH 29.7 26.0 - 34.0 pg   MCHC 34.6 30.0 - 36.0 g/dL   RDW 12.5 11.5 - 15.5 %   Platelets 335 150 - 400 K/uL   nRBC 0.0 0.0 - 0.2 %    Comment: Performed at [facility], [place] [address], [place], [place] [address]  Troponin I (High Sensitivity)     Status: None   Collection Time: 01/01/22  6:39 PM  Result Value Ref Range   Troponin I (High Sensitivity) <2 <18 ng/L    Comment: (NOTE) Elevated high sensitivity troponin I (hsTnI) values and significant  changes across serial measurements may suggest ACS but many other  chronic and acute conditions are known to  elevate hsTnI results.  Refer to the "Links" section for chest pain algorithms and additional  guidance. Performed at [facility], [place] [address] [place], [place] [address]   I-Stat beta hCG blood, ED     Status: None   Collection Time: 01/01/22  6:48 PM  Result Value Ref Range   I-stat hCG, quantitative <5.0 <5 mIU/mL   Comment 3            Comment:   GEST. AGE      CONC.  (mIU/mL)   <=1 WEEK        5 - 50     2 WEEKS       50 - 500     3 WEEKS       100 - 10,000     4 WEEKS     1,000 - 30,000        FEMALE AND NON-PREGNANT FEMALE:     LESS THAN 5 mIU/mL      Psychiatric Specialty Exam: Physical Exam  Review of Systems  Weight 220 lb (99.8 kg), last menstrual period 12/27/2020.There is no height or weight on file to calculate BMI.  General Appearance: NA  Eye Contact:  NA  Speech:  Slow  Volume:  Decreased  Mood:  Anxious and Dysphoric  Affect:  NA  Thought Process:  Goal Directed  Orientation:  Full (Time, Place, and Person)  Thought Content:  Rumination  Suicidal Thoughts:  No  Homicidal Thoughts:  No  Memory:  Immediate;   Good Recent;   Good Remote;   Good  Judgement:  Intact  Insight:  Present  Psychomotor Activity:  NA  Concentration:  Concentration: Fair and Attention Span: Fair  Recall:  Good  Fund of Knowledge:  Good  Language:  Good  Akathisia:  No  Handed:  Right  AIMS (if indicated):     Assets:  Communication Skills Desire for Improvement Housing Social Support  ADL's:  Intact  Cognition:  WNL  Sleep:   7-8 hrs on CPAP      Assessment and Plan: Bipolar disorder type I.  Anxiety.  I reviewed blood work results.  She had lost weight and her sleep is improved since started CPAP.  She is on Trulicity.  Her mania is a stable but she still have a lot of anxiety due to financial stress.  We talk about applying for disability based on mental illness but also discussed that it takes some time to review the case.  Patient feels  the Anette Guarneri is working and does  not want to change.  She has enough Klonopin and does not need a new prescription.  I will continue Latuda 80 mg daily.  Discussed medication side effects and benefits.  Recommend to call us back if she has any questions or any concern.  Follow-up in 3 months.  Patient had a good support from her mother, mother-in-law and her husband.  Follow Up Instructions:    I discussed the assessment and treatment plan with the patient. The patient was provided an opportunity to ask questions and all were answered. The patient agreed with the plan and demonstrated an understanding of the instructions.   The patient was advised to call back or seek an in-person evaluation if the symptoms worsen or if the condition fails to improve as anticipated.  Collaboration of Care: Other provider involved in patient's care AEB notes are available in epic to review.  Patient/Guardian was advised Release of Information must be obtained prior to any record release in order to collaborate their care with an outside provider. Patient/Guardian was advised if they have not already done so to contact the registration department to sign all necessary forms in order for [place] to release information regarding their care.   Consent: Patient/Guardian gives verbal consent for treatment and assignment of benefits for services provided during this visit. Patient/Guardian expressed understanding and agreed to proceed.    I provided 23 minutes of non-face-to-face time during this encounter.   [name], MD

## 2022-04-30 ENCOUNTER — Telehealth (HOSPITAL_BASED_OUTPATIENT_CLINIC_OR_DEPARTMENT_OTHER): Payer: Medicaid Other | Admitting: Psychiatry

## 2022-04-30 ENCOUNTER — Encounter (HOSPITAL_COMMUNITY): Payer: Self-pay | Admitting: Psychiatry

## 2022-04-30 DIAGNOSIS — F3160 Bipolar disorder, current episode mixed, unspecified: Secondary | ICD-10-CM

## 2022-04-30 DIAGNOSIS — F419 Anxiety disorder, unspecified: Secondary | ICD-10-CM | POA: Diagnosis not present

## 2022-04-30 MED ORDER — LURASIDONE HCL 80 MG PO TABS: 80.0000 mg | ORAL_TABLET | Freq: Every day | ORAL | 2 refills | Status: DC

## 2022-04-30 MED ORDER — CLONAZEPAM 1 MG PO TABS: 1.0000 mg | ORAL_TABLET | Freq: Every day | ORAL | 0 refills | Status: DC | PRN

## 2022-04-30 NOTE — Progress Notes (Signed)
Virtual Visit via Telephone Note  I connected with Vicki Huynh on 04/30/22 at 10:20 AM EST by telephone and verified that I am speaking with the correct person using two identifiers.  Location: Patient: Home Provider: Home Office   I discussed the limitations, risks, security and privacy concerns of performing an evaluation and management service by telephone and the availability of in person appointments. I also discussed with the patient that there may be a patient responsible charge related to this service. The patient expressed understanding and agreed to proceed.   History of Present Illness: Patient is evaluated by phone session.  She is taking [place] and Klonopin as needed.  She reported holidays were difficult because missing her father.  She had a good support from her husband.  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  She is relief after injection ruled in her favor for child support.  Patient was afraid that her ex may cut down the child support but she is relieved judge ruled in her favor.  Patient admitted not using CPAP because she is not used to the machine but overall sleep is improved.  She is trying to watch her calorie intake and using portion size.  She started also intermittent fasting.  Her sugar numbers are better.  She admitted there are times when she has to take 2 Klonopin together because 1 pill does not help.  She is wondering if than 0.5 mg she can take 1 mg tablet if needed.  Overall she noticed her mood symptoms are much better.  She denies any mania, agitation, anger, irritability.  She really liked the [place] which is keeping her mood stable.  She has no tremors, shakes or any EPS.  Patient also had a good support from her mother, mother-in-law and her husband.  Past Psychiatric History:  H/O depression, mood swings, irritability, mania and impulsive behavior.  PCP prescribed Effexor in 2012 worked for a while. Tried Lamictal  (hallucination), Buspar (groggy) . H/O ETOH and THC but sober since 2009.  No h/o inpatient or any suicidal attempt.     Psychiatric Specialty Exam: Physical Exam  Review of Systems  Weight 228 lb (103.4 kg), last menstrual period 12/27/2020.There is no height or weight on file to calculate BMI.  General Appearance: NA  Eye Contact:  NA  Speech:  Clear and Coherent and Normal Rate  Volume:  Normal  Mood:  Anxious  Affect:  NA  Thought Process:  Goal Directed  Orientation:  Full (Time, Place, and Person)  Thought Content:  WDL  Suicidal Thoughts:  No  Homicidal Thoughts:  No  Memory:  Immediate;   Good Recent;   Good Remote;   Good  Judgement:  Intact  Insight:  Present  Psychomotor Activity:  NA  Concentration:  Concentration: Good and Attention Span: Good  Recall:  Good  Fund of Knowledge:  Good  Language:  Good  Akathisia:  No  Handed:  Right  AIMS (if indicated):     Assets:  Communication Skills Desire for Improvement Housing Social Support  ADL's:  Intact  Cognition:  WNL  Sleep:   Good, not using CPAP. Unable to get use to machine.      Assessment and Plan: Bipolar disorder type I.  Anxiety.  Patient doing better on Latuda and Klonopin as needed but usually required 2 tablets of Klonopin to calm down her anxiety.  We discussed benzodiazepine dependence, tolerance and  withdrawal.  She acknowledge with concerns of the benzodiazepine.  She is not taking Klonopin every day and she usually take 1 to 2 tablet a week.  However she prefer 1 mg.  We will adjust the dose of Klonopin 0.1 mg to 1 mg to take as needed for severe anxiety.  Continue Latuda 80 mg daily.  Recommended to call us back if she has any questions or any concern.  Follow-up in 3 months  Follow Up Instructions:    I discussed the assessment and treatment plan with the patient. The patient was provided an opportunity to ask questions and all were answered. The patient agreed with the plan and  demonstrated an understanding of the instructions.   The patient was advised to call back or seek an in-person evaluation if the symptoms worsen or if the condition fails to improve as anticipated.  Collaboration of Care: Other provider involved in patient's care AEB notes are available in epic to review.  Patient/Guardian was advised Release of Information must be obtained prior to any record release in order to collaborate their care with an outside provider. Patient/Guardian was advised if they have not already done so to contact the registration department to sign all necessary forms in order for [place] to release information regarding their care.   Consent: Patient/Guardian gives verbal consent for treatment and assignment of benefits for services provided during this visit. Patient/Guardian expressed understanding and agreed to proceed.    I provided 11 minutes of non-face-to-face time during this encounter.   [name], MD

## 2022-07-30 ENCOUNTER — Encounter (HOSPITAL_COMMUNITY): Payer: Self-pay | Admitting: Psychiatry

## 2022-07-30 ENCOUNTER — Telehealth (HOSPITAL_BASED_OUTPATIENT_CLINIC_OR_DEPARTMENT_OTHER): Payer: Medicaid Other | Admitting: Psychiatry

## 2022-07-30 VITALS — Wt 224.0 lb

## 2022-07-30 DIAGNOSIS — F3160 Bipolar disorder, current episode mixed, unspecified: Secondary | ICD-10-CM

## 2022-07-30 DIAGNOSIS — F419 Anxiety disorder, unspecified: Secondary | ICD-10-CM | POA: Diagnosis not present

## 2022-07-30 MED ORDER — LURASIDONE HCL 80 MG PO TABS: 80.0000 mg | ORAL_TABLET | Freq: Every day | ORAL | 2 refills | Status: DC

## 2022-07-30 NOTE — Progress Notes (Signed)
Health MD Virtual Progress Note   Patient Location: Home Provider Location: Home Office  I connect with patient by telephone and verified that I am speaking with correct person by using two identifiers. I discussed the limitations of evaluation and management by telemedicine and the availability of in person appointments. I also discussed with the patient that there may be a patient responsible charge related to this service. The patient expressed understanding and agreed to proceed.  Vicki Huynh [id number] 42 y.o.  07/30/2022 10:17 AM  History of Present Illness:  Patient is evaluated by phone session.  She is taking Jordan which is helping her mood irritability and mania.  She has not taken Klonopin as her anxiety is not as bad.  She recently celebrated her 53 year old son.  She denies any crying spells or any feeling of hopelessness or worthlessness.  She is happy as last blood work shows hemoglobin A1c 6.2.  Her primary care is Dr. Sigmund Hazel.  She is now on Trulicity along with Glucophage and glimepiride.  She reported energy level is good.  She sleeps good even though not using the CPAP machine.  Patient told having some issues with the machine and she have to return but noticed sleep is actually doing very well.  She stopped looking for a job because she was told it may mess up the child support.  She had a court case and judge ruled in her favor for child support.  She lost few pounds since the last visit as trying to watch her calorie intake and exercising.  She had a good support from her mother, mother-in-law and her husband.  She has no tremors, shakes or any EPS.  She denies any hallucination, paranoia or any suicidal thoughts.  She denies any irritability or mood swings.  Past Psychiatric History: H/O depression, mood swings, irritability, mania and impulsive behavior.  PCP prescribed Effexor in 2012 worked for a while. Tried Lamictal (hallucination), Buspar  (groggy) . H/O ETOH and THC but sober since 2009.  No h/o inpatient or any suicidal attempt.      Outpatient Encounter Medications as of 07/30/2022  Medication Sig   ACCU-CHEK FASTCLIX LANCETS MISC    ACCU-CHEK GUIDE test strip    acetaminophen (TYLENOL) 500 MG tablet Take 2 tablets (1,000 mg total) by mouth every 8 (eight) hours as needed for mild pain or moderate pain.   bismuth subsalicylate (PEPTO BISMOL) 262 MG chewable tablet Chew 524 mg by mouth daily as needed for indigestion.   clonazePAM (KLONOPIN) 1 MG tablet Take 1 tablet (1 mg total) by mouth daily as needed for anxiety.   glimepiride (AMARYL) 4 MG tablet Take 4 mg by mouth at bedtime.   hydrOXYzine (VISTARIL) 50 MG capsule Take 1 capsule (50 mg total) by mouth at bedtime. (Patient not taking: Reported on 10/30/2021)   losartan (COZAAR) 25 MG tablet Take 25 mg by mouth every evening.    lurasidone (LATUDA) 80 MG TABS tablet Take 1 tablet (80 mg total) by mouth at bedtime.   metFORMIN (GLUCOPHAGE-XR) 500 MG 24 hr tablet 1,000 mg every evening.   pantoprazole (PROTONIX) 20 MG tablet Take 40 mg by mouth 2 (two) times daily. 2 at evening meal and 2 at bedtime   rosuvastatin (CRESTOR) 5 MG tablet Take 5 mg by mouth every other day. Qod in evening   No facility-administered encounter medications on file as of 07/30/2022.    No results found for this or any previous visit (from  the past 2160 hour(s)).   Psychiatric Specialty Exam: Physical Exam  Review of Systems  Weight 224 lb (101.6 kg), last menstrual period 12/27/2020.There is no height or weight on file to calculate BMI.  General Appearance: NA  Eye Contact:  NA  Speech:  Clear and Coherent and Normal Rate  Volume:  Normal  Mood:  Euthymic  Affect:  NA  Thought Process:  Goal Directed  Orientation:  Full (Time, Place, and Person)  Thought Content:  WDL  Suicidal Thoughts:  No  Homicidal Thoughts:  No  Memory:  Immediate;   Good Recent;   Good Remote;   Good  Judgement:   Intact  Insight:  Present  Psychomotor Activity:  NA  Concentration:  Concentration: Good and Attention Span: Good  Recall:  Good  Fund of Knowledge:  Good  Language:  Good  Akathisia:  No  Handed:  Right  AIMS (if indicated):     Assets:  Communication Skills Desire for Improvement Housing Resilience Social Support Talents/Skills Transportation  ADL's:  Intact  Cognition:  WNL  Sleep:  ok     Assessment/Plan: Bipolar 1 disorder, mixed (HCC) - Plan: lurasidone (LATUDA) 80 MG TABS tablet  Anxiety - Plan: lurasidone (LATUDA) 80 MG TABS tablet  Patient is stable on current medication.  She is taking Latuda 80 mg daily.  She has Klonopin remaining and does not need a new prescription as her anxiety is not as bad and not taking on a regular basis.  She is happy as lost weight and hemoglobin A1c improved from past and now 6.2.  Discussed medication side effects and benefits.  Recommended to call us back if she has any question or any concern.  Follow-up in 3 months.   Follow Up Instructions:     I discussed the assessment and treatment plan with the patient. The patient was provided an opportunity to ask questions and all were answered. The patient agreed with the plan and demonstrated an understanding of the instructions.   The patient was advised to call back or seek an in-person evaluation if the symptoms worsen or if the condition fails to improve as anticipated.    Collaboration of Care: Other provider involved in patient's care AEB notes are available in epic to review.  Patient/Guardian was advised Release of Information must be obtained prior to any record release in order to collaborate their care with an outside provider. Patient/Guardian was advised if they have not already done so to contact the registration department to sign all necessary forms in order for [place] to release information regarding their care.   Consent: Patient/Guardian gives verbal consent for treatment  and assignment of benefits for services provided during this visit. Patient/Guardian expressed understanding and agreed to proceed.     I provided 18.  Minutes of non face to face time during this encounter.  Note: This document was prepared by [employer] voice dictation technology and any errors that results from this process are unintentional.    Cleotis Nipper, MD 07/30/2022

## 2022-08-08 ENCOUNTER — Other Ambulatory Visit: Payer: Self-pay

## 2022-08-08 ENCOUNTER — Emergency Department (HOSPITAL_COMMUNITY): Payer: Medicaid Other

## 2022-08-08 ENCOUNTER — Emergency Department (HOSPITAL_COMMUNITY)
Admission: EM | Admit: 2022-08-08 | Discharge: 2022-08-08 | Disposition: A | Discharge status: Home / Self Care | Payer: Medicaid Other | Attending: Emergency Medicine | Admitting: Emergency Medicine

## 2022-08-08 DIAGNOSIS — N200 Calculus of kidney: Secondary | ICD-10-CM | POA: Insufficient documentation

## 2022-08-08 DIAGNOSIS — R109 Unspecified abdominal pain: Secondary | ICD-10-CM | POA: Diagnosis present

## 2022-08-08 DIAGNOSIS — Z794 Long term (current) use of insulin: Secondary | ICD-10-CM | POA: Diagnosis not present

## 2022-08-08 LAB — URINALYSIS, ROUTINE W REFLEX MICROSCOPIC
Bacteria, UA: NONE SEEN
Bilirubin Urine: NEGATIVE
Glucose, UA: 150 mg/dL — AB
Ketones, ur: 5 mg/dL — AB
Leukocytes,Ua: NEGATIVE
Nitrite: NEGATIVE
Protein, ur: 100 mg/dL — AB
RBC / HPF: >50 RBC/hpf (ref 0–5)
Specific Gravity, Urine: 1.041 — ABNORMAL HIGH (ref 1.005–1.030)
pH: 5 (ref 5.0–8.0)

## 2022-08-08 LAB — CBC WITH DIFFERENTIAL/PLATELET
Abs Immature Granulocytes: 0.02 10*3/uL (ref 0.00–0.07)
Basophils Absolute: 0 10*3/uL (ref 0.0–0.1)
Basophils Relative: 1 %
Eosinophils Absolute: 0.2 10*3/uL (ref 0.0–0.5)
Eosinophils Relative: 2 %
HCT: 37.9 % (ref 36.0–46.0)
Hemoglobin: 12.9 g/dL (ref 12.0–15.0)
Immature Granulocytes: 0 %
Lymphocytes Relative: 32 %
Lymphs Abs: 2.7 10*3/uL (ref 0.7–4.0)
MCH: 29.3 pg (ref 26.0–34.0)
MCHC: 34 g/dL (ref 30.0–36.0)
MCV: 85.9 fL (ref 80.0–100.0)
Monocytes Absolute: 0.4 10*3/uL (ref 0.1–1.0)
Monocytes Relative: 4 %
Neutro Abs: 5.1 10*3/uL (ref 1.7–7.7)
Neutrophils Relative %: 61 %
Platelets: 363 10*3/uL (ref 150–400)
RBC: 4.41 MIL/uL (ref 3.87–5.11)
RDW: 12.3 % (ref 11.5–15.5)
WBC: 8.4 10*3/uL (ref 4.0–10.5)
nRBC: 0 % (ref 0.0–0.2)

## 2022-08-08 LAB — BASIC METABOLIC PANEL
Anion gap: 11 (ref 5–15)
BUN: 20 mg/dL (ref 6–20)
CO2: 22 mmol/L (ref 22–32)
Calcium: 9.3 mg/dL (ref 8.9–10.3)
Chloride: 103 mmol/L (ref 98–111)
Creatinine, Ser: 0.8 mg/dL (ref 0.44–1.00)
GFR, Estimated: >60 mL/min (ref >60)
Glucose, Bld: 238 mg/dL — ABNORMAL HIGH (ref 70–99)
Potassium: 3.3 mmol/L — ABNORMAL LOW (ref 3.5–5.1)
Sodium: 136 mmol/L (ref 135–145)

## 2022-08-08 LAB — CBG MONITORING, ED: Glucose-Capillary: 118 mg/dL — ABNORMAL HIGH (ref 70–99)

## 2022-08-08 LAB — HCG, QUANTITATIVE, PREGNANCY: hCG, Beta Chain, Quant, S: <1 m[IU]/mL (ref <5)

## 2022-08-08 MED ORDER — KETOROLAC TROMETHAMINE 30 MG/ML IJ SOLN
15.0000 mg | Freq: Once | INTRAMUSCULAR | Status: DC
  Filled 2022-08-08: qty 1

## 2022-08-08 MED ORDER — KETOROLAC TROMETHAMINE 15 MG/ML IJ SOLN
15.0000 mg | Freq: Once | INTRAMUSCULAR | Status: AC
  Administered 2022-08-08: 15 mg via INTRAVENOUS

## 2022-08-08 MED ORDER — IBUPROFEN 400 MG PO TABS: 400.0000 mg | ORAL_TABLET | Freq: Three times a day (TID) | ORAL | 0 refills | Status: AC

## 2022-08-08 MED ORDER — ONDANSETRON 4 MG PO TBDP: 4.0000 mg | ORAL_TABLET | Freq: Three times a day (TID) | ORAL | 0 refills | Status: AC | PRN

## 2022-08-08 NOTE — ED Notes (Signed)
No minilab in ER tonight due to staffing. Will change order for beta to main lab

## 2022-08-08 NOTE — ED Triage Notes (Signed)
Pt arrives c/o bilateral flank pain that started a couple days ago and hematuria that started today. Pt also states that she is having intermittent suprapubic pain. Denies dysuria. States that she noted pink blood when she wiped after urinating and blood in the toilet twice today. Hx of hysterectomy 1.5 years ago. Took 800 mg ibuprofen prior to arrival due to also having headache today.

## 2022-08-08 NOTE — Discharge Instructions (Signed)
You have been diagnosed with a kidney stone.  Typically these pass, though with discomfort.  If you develop new, or concerning changes, or a fever that does not improve with Tylenol, return here for additional evaluation.

## 2022-08-08 NOTE — ED Provider Notes (Signed)
[place] EMERGENCY DEPARTMENT AT [facility] Provider Note   CSN: [id number] Arrival date & time: 08/08/22  2002     History  Chief Complaint  Patient presents with   Flank Pain   Hematuria    Vicki Huynh is a 42 y.o. female.  HPI Patient presents with concern of right flank pain and hematuria. Onset was today, no clear precipitant, patient was well prior to the onset of symptoms. She is status post hysterectomy. She equates the pain as to that which she had during menstrual cycles. No fever, chest pain, nausea or other complaints.    Home Medications Prior to Admission medications   Medication Sig Start Date End Date Taking? Authorizing Provider  ibuprofen (ADVIL) 400 MG tablet Take 1 tablet (400 mg total) by mouth 3 (three) times daily for 3 days. Take one tablet three times daily for three days 08/08/22 08/11/22 Yes Gerhard Munch, MD  ondansetron (ZOFRAN-ODT) 4 MG disintegrating tablet Take 1 tablet (4 mg total) by mouth every 8 (eight) hours as needed for nausea or vomiting. 08/08/22  Yes Gerhard Munch, MD  ACCU-CHEK FASTCLIX LANCETS MISC  06/20/17   [provider]  ACCU-CHEK GUIDE test strip  06/20/17   [provider]  acetaminophen (TYLENOL) 500 MG tablet Take 2 tablets (1,000 mg total) by mouth every 8 (eight) hours as needed for mild pain or moderate pain. 01/16/21   Gerald Leitz, MD  bismuth subsalicylate (PEPTO BISMOL) 262 MG chewable tablet Chew 524 mg by mouth daily as needed for indigestion.    [provider]  clonazePAM (KLONOPIN) 1 MG tablet Take 1 tablet (1 mg total) by mouth daily as needed for anxiety. 04/30/22 04/30/23  Arfeen, Phillips Grout, MD  glimepiride (AMARYL) 4 MG tablet Take 4 mg by mouth at bedtime.    Sigmund Hazel, MD  hydrOXYzine (VISTARIL) 50 MG capsule Take 1 capsule (50 mg total) by mouth at bedtime. Patient not taking: Reported on 10/30/2021 08/27/21   Arfeen, Phillips Grout, MD  losartan (COZAAR) 25 MG tablet Take  25 mg by mouth every evening.  12/16/17   [provider]  lurasidone (LATUDA) 80 MG TABS tablet Take 1 tablet (80 mg total) by mouth at bedtime. 07/30/22   Arfeen, Phillips Grout, MD  metFORMIN (GLUCOPHAGE-XR) 500 MG 24 hr tablet 1,000 mg every evening. 07/23/20   Sigmund Hazel, MD  pantoprazole (PROTONIX) 20 MG tablet Take 40 mg by mouth 2 (two) times daily. 2 at evening meal and 2 at bedtime 11/30/18   [provider]  rosuvastatin (CRESTOR) 5 MG tablet Take 5 mg by mouth every other day. Qod in evening 12/28/17   [provider]  TRULICITY 0.75 MG/0.5ML SOPN Inject 0.75 mg as directed once a week.    Sigmund Hazel, MD      Allergies    Lamictal [lamotrigine], Topamax [topiramate], Nickel, and Wool alcohol [lanolin alcohol]    Review of Systems   Review of Systems  All other systems reviewed and are negative.   Physical Exam Updated Vital Signs BP 117/82   Pulse 86   Temp 98.5 F (36.9 C) (Oral)   Resp 18   Ht 5\' 7"  (1.702 m)   Wt 102.1 kg   LMP 12/27/2020   SpO2 96%   BMI 35.24 kg/m  Physical Exam Vitals and nursing note reviewed.  Constitutional:      General: She is not in acute distress.    Appearance: She is well-developed.  HENT:  Head: Normocephalic and atraumatic.  Eyes:     Conjunctiva/sclera: Conjunctivae normal.  Pulmonary:     Effort: Pulmonary effort is normal. No respiratory distress.     Breath sounds: No stridor.  Abdominal:     General: There is no distension.     Tenderness: There is no abdominal tenderness. There is right CVA tenderness.  Skin:    General: Skin is warm and dry.  Neurological:     Mental Status: She is alert and oriented to person, place, and time.     Cranial Nerves: No cranial nerve deficit.  Psychiatric:        Mood and Affect: Mood normal.     ED Results / Procedures / Treatments   Labs (all labs ordered are listed, but only abnormal results are displayed) Labs Reviewed  URINALYSIS, ROUTINE W REFLEX  MICROSCOPIC - Abnormal; Notable for the following components:      Result Value   Color, Urine RED (*)    APPearance TURBID (*)    Specific Gravity, Urine 1.041 (*)    Glucose, UA 150 (*)    Hgb urine dipstick LARGE (*)    Ketones, ur 5 (*)    Protein, ur 100 (*)    All other components within normal limits  BASIC METABOLIC PANEL - Abnormal; Notable for the following components:   Potassium 3.3 (*)    Glucose, Bld 238 (*)    All other components within normal limits  CBG MONITORING, ED - Abnormal; Notable for the following components:   Glucose-Capillary 118 (*)    All other components within normal limits  CBC WITH DIFFERENTIAL/PLATELET  HCG, QUANTITATIVE, PREGNANCY  I-STAT BETA HCG BLOOD, ED ([facility], [facility], [facility] ONLY)    EKG None  Radiology CT Renal Stone Study  Result Date: 08/08/2022 CLINICAL DATA:  Right-sided flank pain EXAM: CT ABDOMEN AND PELVIS WITHOUT CONTRAST TECHNIQUE: Multidetector CT imaging of the abdomen and pelvis was performed following the standard protocol without IV contrast. RADIATION DOSE REDUCTION: This exam was performed according to the departmental dose-optimization program which includes automated exposure control, adjustment of the mA and/or kV according to patient size and/or use of iterative reconstruction technique. COMPARISON:  CT 12/27/2020 FINDINGS: Lower chest: Lung bases are clear. Hepatobiliary: No focal liver abnormality is seen. Status post cholecystectomy. No biliary dilatation. Pancreas: Unremarkable. No pancreatic ductal dilatation or surrounding inflammatory changes. Spleen: Normal in size without focal abnormality. Adrenals/Urinary Tract: Adrenal glands are normal. Kidneys show no hydronephrosis. Small cyst lower pole left kidney, no imaging follow-up is recommended. 4 mm stone in the left renal pelvis without hydronephrosis. Bladder is unremarkable. Stomach/Bowel: The stomach is nonenlarged. No dilated small bowel. No acute bowel wall thickening.  Negative appendix. Vascular/Lymphatic: Nonaneurysmal aorta.  No suspicious lymph nodes Reproductive: Hysterectomy.  No adnexal mass Other: Negative for pelvic effusion or free air Musculoskeletal: No acute or suspicious osseous abnormality IMPRESSION: 1. 4 mm stone in the left renal pelvis without hydronephrosis or hydroureter. 2. Otherwise no CT evidence for acute intra-abdominal or pelvic abnormality. Electronically Signed   By: Jasmine Pang M.D.   On: 08/08/2022 22:53    Procedures Procedures    Medications Ordered in ED Medications  ketorolac (TORADOL) 15 MG/ML injection 15 mg (15 mg Intravenous Given 08/08/22 2109)    ED Course/ Medical Decision Making/ A&P                             Medical Decision  Making Previously well adult female presents with sudden onset flank pain, hematuria.  Differential includes kidney stone, pyelonephritis, urinary tract infection, other intra-abdominal processes.  Amount and/or Complexity of Data Reviewed Labs: ordered. Decision-making details documented in ED Course. Radiology: ordered and independent interpretation performed. Decision-making details documented in ED Course.  Risk Prescription drug management. Decision regarding hospitalization.   11:10 PM Patient in no rest, smiling, substantially better following analgesics.  I reviewed her labs, urinalysis, CT, findings consistent with 4 mm stone, left-sided, no evidence for infection or complete obstruction. No evidence of bacteremia, sepsis.        Final Clinical Impression(s) / ED Diagnoses Final diagnoses:  Kidney stone    Rx / DC Orders ED Discharge Orders          Ordered    ibuprofen (ADVIL) 400 MG tablet  3 times daily        08/08/22 2310    ondansetron (ZOFRAN-ODT) 4 MG disintegrating tablet  Every 8 hours PRN        08/08/22 2310              Gerhard Munch, MD 08/08/22 2310

## 2022-09-14 ENCOUNTER — Emergency Department (HOSPITAL_COMMUNITY)
Admission: EM | Admit: 2022-09-14 | Discharge: 2022-09-14 | Disposition: A | Discharge status: Home / Self Care | Payer: Medicaid Other | Attending: Emergency Medicine | Admitting: Emergency Medicine

## 2022-09-14 ENCOUNTER — Other Ambulatory Visit: Payer: Self-pay

## 2022-09-14 ENCOUNTER — Emergency Department (HOSPITAL_COMMUNITY): Payer: Medicaid Other

## 2022-09-14 DIAGNOSIS — R1012 Left upper quadrant pain: Secondary | ICD-10-CM | POA: Diagnosis not present

## 2022-09-14 DIAGNOSIS — N200 Calculus of kidney: Secondary | ICD-10-CM | POA: Diagnosis not present

## 2022-09-14 DIAGNOSIS — E119 Type 2 diabetes mellitus without complications: Secondary | ICD-10-CM | POA: Insufficient documentation

## 2022-09-14 DIAGNOSIS — Z7984 Long term (current) use of oral hypoglycemic drugs: Secondary | ICD-10-CM | POA: Diagnosis not present

## 2022-09-14 DIAGNOSIS — R1084 Generalized abdominal pain: Secondary | ICD-10-CM | POA: Diagnosis present

## 2022-09-14 LAB — CBC
HCT: 39.4 % (ref 36.0–46.0)
Hemoglobin: 13.6 g/dL (ref 12.0–15.0)
MCH: 29.5 pg (ref 26.0–34.0)
MCHC: 34.5 g/dL (ref 30.0–36.0)
MCV: 85.5 fL (ref 80.0–100.0)
Platelets: 350 10*3/uL (ref 150–400)
RBC: 4.61 MIL/uL (ref 3.87–5.11)
RDW: 12.4 % (ref 11.5–15.5)
WBC: 9.2 10*3/uL (ref 4.0–10.5)
nRBC: 0 % (ref 0.0–0.2)

## 2022-09-14 LAB — COMPREHENSIVE METABOLIC PANEL
ALT: 30 U/L (ref 0–44)
AST: 22 U/L (ref 15–41)
Albumin: 4.4 g/dL (ref 3.5–5.0)
Alkaline Phosphatase: 59 U/L (ref 38–126)
Anion gap: 10 (ref 5–15)
BUN: 13 mg/dL (ref 6–20)
CO2: 23 mmol/L (ref 22–32)
Calcium: 9.2 mg/dL (ref 8.9–10.3)
Chloride: 105 mmol/L (ref 98–111)
Creatinine, Ser: 0.65 mg/dL (ref 0.44–1.00)
GFR, Estimated: >60 mL/min (ref >60)
Glucose, Bld: 114 mg/dL — ABNORMAL HIGH (ref 70–99)
Potassium: 3.5 mmol/L (ref 3.5–5.1)
Sodium: 138 mmol/L (ref 135–145)
Total Bilirubin: 0.7 mg/dL (ref 0.3–1.2)
Total Protein: 8.2 g/dL — ABNORMAL HIGH (ref 6.5–8.1)

## 2022-09-14 LAB — URINALYSIS, ROUTINE W REFLEX MICROSCOPIC
Bilirubin Urine: NEGATIVE
Glucose, UA: NEGATIVE mg/dL
Ketones, ur: NEGATIVE mg/dL
Leukocytes,Ua: NEGATIVE
Nitrite: NEGATIVE
Protein, ur: 30 mg/dL — AB
Specific Gravity, Urine: 1.027 (ref 1.005–1.030)
pH: 5 (ref 5.0–8.0)

## 2022-09-14 LAB — LIPASE, BLOOD: Lipase: 31 U/L (ref 11–51)

## 2022-09-14 MED ORDER — IOHEXOL 300 MG/ML  SOLN
100.0000 mL | Freq: Once | INTRAMUSCULAR | Status: AC | PRN
  Administered 2022-09-14: 100 mL via INTRAVENOUS

## 2022-09-14 MED ORDER — ONDANSETRON HCL 4 MG/2ML IJ SOLN
4.0000 mg | Freq: Once | INTRAMUSCULAR | Status: AC
  Administered 2022-09-14: 4 mg via INTRAVENOUS
  Filled 2022-09-14: qty 2

## 2022-09-14 MED ORDER — DICYCLOMINE HCL 20 MG PO TABS: 20.0000 mg | ORAL_TABLET | Freq: Two times a day (BID) | ORAL | 0 refills | Status: AC | PRN

## 2022-09-14 MED ORDER — KETOROLAC TROMETHAMINE 15 MG/ML IJ SOLN
15.0000 mg | Freq: Once | INTRAMUSCULAR | Status: AC
  Administered 2022-09-14: 15 mg via INTRAVENOUS
  Filled 2022-09-14: qty 1

## 2022-09-14 NOTE — ED Provider Notes (Signed)
Patient reports a history of a hysterectomy, waiving pregnancy screening at this time.   Vicki Sleeper, MD 09/14/22 [phone]

## 2022-09-14 NOTE — ED Provider Notes (Signed)
[place] EMERGENCY DEPARTMENT AT [facility] Provider Note   CSN: [id number] Arrival date & time: 09/14/22  1631     History {Add pertinent medical, surgical, social history, OB history to HPI:1} Chief Complaint  Patient presents with   Abdominal Pain    Vicki Huynh is a 42 y.o. female with past medical history significant for bipolar, anxiety, depression, diabetes, GERD who presents concern for central abdominal pain mild nausea intermittently for 1 month.  Seen at [facility] long last month for similar, ended up having 4 mm stone in the left renal pelvis, suspicious for renal colic.  Patient reports that she has seen her PCP multiple times about the same, PCP was suspicious that it might be her Trulicity medication but no improvement after discontinuing Trulicity.  Also suspicious that it could be constipation, patient reports that she has not been constipated.  She denies any fever, chills, denies any nausea but does endorse vomiting.  She denies any diarrhea, constipation.  She reports the pain is not associated with eating.  Previous history of cholecystectomy, hysterectomy   Abdominal Pain      Home Medications Prior to Admission medications   Medication Sig Start Date End Date Taking? Authorizing Provider  ACCU-CHEK FASTCLIX LANCETS MISC  06/20/17   [provider]  ACCU-CHEK GUIDE test strip  06/20/17   [provider]  acetaminophen (TYLENOL) 500 MG tablet Take 2 tablets (1,000 mg total) by mouth every 8 (eight) hours as needed for mild pain or moderate pain. 01/16/21   Gerald Leitz, MD  bismuth subsalicylate (PEPTO BISMOL) 262 MG chewable tablet Chew 524 mg by mouth daily as needed for indigestion.    [provider]  clonazePAM (KLONOPIN) 1 MG tablet Take 1 tablet (1 mg total) by mouth daily as needed for anxiety. 04/30/22 04/30/23  Arfeen, Phillips Grout, MD  glimepiride (AMARYL) 4 MG tablet Take 4 mg by mouth at bedtime.    Sigmund Hazel, MD   hydrOXYzine (VISTARIL) 50 MG capsule Take 1 capsule (50 mg total) by mouth at bedtime. Patient not taking: Reported on 10/30/2021 08/27/21   Arfeen, Phillips Grout, MD  losartan (COZAAR) 25 MG tablet Take 25 mg by mouth every evening.  12/16/17   [provider]  lurasidone (LATUDA) 80 MG TABS tablet Take 1 tablet (80 mg total) by mouth at bedtime. 07/30/22   Arfeen, Phillips Grout, MD  metFORMIN (GLUCOPHAGE-XR) 500 MG 24 hr tablet 1,000 mg every evening. 07/23/20   Sigmund Hazel, MD  ondansetron (ZOFRAN-ODT) 4 MG disintegrating tablet Take 1 tablet (4 mg total) by mouth every 8 (eight) hours as needed for nausea or vomiting. 08/08/22   Gerhard Munch, MD  pantoprazole (PROTONIX) 20 MG tablet Take 40 mg by mouth 2 (two) times daily. 2 at evening meal and 2 at bedtime 11/30/18   [provider]  rosuvastatin (CRESTOR) 5 MG tablet Take 5 mg by mouth every other day. Qod in evening 12/28/17   [provider]  TRULICITY 0.75 MG/0.5ML SOPN Inject 0.75 mg as directed once a week.    Sigmund Hazel, MD      Allergies    Lamictal [lamotrigine], Topamax [topiramate], Nickel, and Wool alcohol [lanolin alcohol]    Review of Systems   Review of Systems  Gastrointestinal:  Positive for abdominal pain.  All other systems reviewed and are negative.   Physical Exam Updated Vital Signs BP (!) 130/97 (BP Location: Right Arm)   Pulse 96   Temp 97.8 F (  36.6 C) (Oral)   Resp 18   Ht 5\' 7"  (1.702 m)   Wt 102.1 kg   LMP 12/27/2020   SpO2 100%   BMI 35.24 kg/m  Physical Exam Vitals and nursing note reviewed.  Constitutional:      General: She is not in acute distress.    Appearance: Normal appearance.  HENT:     Head: Normocephalic and atraumatic.  Eyes:     General:        Right eye: No discharge.        Left eye: No discharge.  Cardiovascular:     Rate and Rhythm: Normal rate and regular rhythm.     Heart sounds: No murmur heard.    No friction rub. No gallop.  Pulmonary:     Effort:  Pulmonary effort is normal.     Breath sounds: Normal breath sounds.  Abdominal:     General: Bowel sounds are normal.     Palpations: Abdomen is soft.     Comments: Focal tenderness to palpation in the left upper quadrant, no rebound, rigidity, guarding, no distention noted on exam.  Normal bowel sounds throughout.  Skin:    General: Skin is warm and dry.     Capillary Refill: Capillary refill takes less than 2 seconds.  Neurological:     Mental Status: She is alert and oriented to person, place, and time.  Psychiatric:        Mood and Affect: Mood normal.        Behavior: Behavior normal.     ED Results / Procedures / Treatments   Labs (all labs ordered are listed, but only abnormal results are displayed) Labs Reviewed  LIPASE, BLOOD  COMPREHENSIVE METABOLIC PANEL  CBC  URINALYSIS, ROUTINE W REFLEX MICROSCOPIC  I-STAT BETA HCG BLOOD, ED ([facility], [facility], [facility] ONLY)    EKG None  Radiology No results found.  Procedures Procedures  {Document cardiac monitor, telemetry assessment procedure when appropriate:1}  Medications Ordered in ED Medications  ondansetron (ZOFRAN) injection 4 mg (has no administration in time range)  ketorolac (TORADOL) 15 MG/ML injection 15 mg (has no administration in time range)    ED Course/ Medical Decision Making/ A&P   {   Click here for ABCD2, HEART and other calculatorsREFRESH Note before signing :1}                          Medical Decision Making Amount and/or Complexity of Data Reviewed Labs: ordered. Radiology: ordered.  Risk Prescription drug management.   This patient is a 42 y.o. female  who presents to the ED for concern of abdominal pain.   Differential diagnoses prior to evaluation: The emergent differential diagnosis includes, but is not limited to,  The causes of generalized abdominal pain include but are not limited to AAA, mesenteric ischemia, appendicitis, diverticulitis, DKA, gastritis, gastroenteritis, AMI,  nephrolithiasis, pancreatitis, peritonitis, adrenal insufficiency,lead poisoning, iron toxicity, intestinal ischemia, constipation, UTI,SBO/LBO, splenic rupture, biliary disease, IBD, IBS, PUD, or hepatitis. This is not an exhaustive differential.   Past Medical History / Co-morbidities / Social History: bipolar, anxiety, depression, diabetes, GERD  Additional history: Chart reviewed. Pertinent results include: ***  Physical Exam: Physical exam performed. The pertinent findings include: ***  Lab Tests/Imaging studies: I personally interpreted labs/imaging and the pertinent results include:  ***. ***I agree with the radiologist interpretation.  Cardiac monitoring: EKG obtained and interpreted by my attending physician which shows: ***   Medications: I ordered  medication including ***.  I have reviewed the patients home medicines and have made adjustments as needed.   Disposition: After consideration of the diagnostic results and the patients response to treatment, I feel that *** .   ***emergency department workup does not suggest an emergent condition requiring admission or immediate intervention beyond what has been performed at this time. The plan is: ***. The patient is safe for discharge and has been instructed to return immediately for worsening symptoms, change in symptoms or any other concerns.  Final Clinical Impression(s) / ED Diagnoses Final diagnoses:  None    Rx / DC Orders ED Discharge Orders     None

## 2022-09-14 NOTE — ED Triage Notes (Signed)
Pt arrived via POV. C/o central abd pain, and mild nausea for 1x month. Pt was seen here at [facility] recently for similar. Pt has had no relief in symptoms. No constipation.  AOx4

## 2022-09-18 ENCOUNTER — Other Ambulatory Visit: Payer: Self-pay | Admitting: Family Medicine

## 2022-09-18 DIAGNOSIS — Z1231 Encounter for screening mammogram for malignant neoplasm of breast: Secondary | ICD-10-CM

## 2022-10-30 ENCOUNTER — Encounter (HOSPITAL_COMMUNITY): Payer: Self-pay | Admitting: Psychiatry

## 2022-10-30 ENCOUNTER — Telehealth (HOSPITAL_BASED_OUTPATIENT_CLINIC_OR_DEPARTMENT_OTHER): Payer: Medicaid Other | Admitting: Psychiatry

## 2022-10-30 VITALS — Wt 225.0 lb

## 2022-10-30 DIAGNOSIS — F3160 Bipolar disorder, current episode mixed, unspecified: Secondary | ICD-10-CM

## 2022-10-30 DIAGNOSIS — F419 Anxiety disorder, unspecified: Secondary | ICD-10-CM | POA: Diagnosis not present

## 2022-10-30 MED ORDER — CLONAZEPAM 1 MG PO TABS
1.0000 mg | ORAL_TABLET | Freq: Every day | ORAL | 0 refills | Status: DC | PRN
Start: 2022-10-30 — End: 2023-05-04

## 2022-10-30 MED ORDER — LURASIDONE HCL 80 MG PO TABS: 80.0000 mg | ORAL_TABLET | Freq: Every day | ORAL | 2 refills | Status: DC

## 2022-10-30 NOTE — Progress Notes (Signed)
[facility] MD Virtual Progress Note   Patient Location: In Car Provider Location: Office  I connect with patient by telephone and verified that I am speaking with correct person by using two identifiers. I discussed the limitations of evaluation and management by telemedicine and the availability of in person appointments. I also discussed with the patient that there may be a patient responsible charge related to this service. The patient expressed understanding and agreed to proceed.  Vicki Huynh [id number] 42 y.o.  10/30/2022 10:26 AM  History of Present Illness:  Patient is evaluated by phone session.  She reported lately more anxious and nervous.  She is not sure what triggered but endorsed chronic financial strain.  She has not taken hydroxyzine because she took in the past that make her very groggy and does not feel good.  She also reported for some reason has not taken the Klonopin but that helps.  She is taking Latuda 80 mg.  Last when she was in the emergency room for abdominal pain now she is feeling better.  She denies any mania, psychosis, hallucination.  She denies any anger or irritability.  She reported her hemoglobin A1c is stable.  She is not working because it messed up the child support.  Her sleep is okay.  She has no tremors or shakes or any EPS.  She denies any suicidal thoughts.  Past Psychiatric History: H/O depression, mood swings, irritability, mania and impulsive behavior.  PCP prescribed Effexor in 2012 worked for a while. Tried Lamictal (hallucination), Buspar (groggy) . H/O ETOH and THC but sober since 2009.  No h/o inpatient or any suicidal attempt.      Outpatient Encounter Medications as of 10/30/2022  Medication Sig   ACCU-CHEK FASTCLIX LANCETS MISC    ACCU-CHEK GUIDE test strip    acetaminophen (TYLENOL) 500 MG tablet Take 2 tablets (1,000 mg total) by mouth every 8 (eight) hours as needed for mild pain or moderate pain.   bismuth  subsalicylate (PEPTO BISMOL) 262 MG chewable tablet Chew 524 mg by mouth daily as needed for indigestion.   clonazePAM (KLONOPIN) 1 MG tablet Take 1 tablet (1 mg total) by mouth daily as needed for anxiety.   dicyclomine (BENTYL) 20 MG tablet Take 1 tablet (20 mg total) by mouth 2 (two) times daily as needed for up to 20 doses for spasms.   esomeprazole (NEXIUM) 40 MG capsule Take 40 mg by mouth 2 (two) times daily before a meal.   glimepiride (AMARYL) 4 MG tablet Take 4 mg by mouth at bedtime.   hydrOXYzine (VISTARIL) 50 MG capsule Take 1 capsule (50 mg total) by mouth at bedtime. (Patient not taking: Reported on 10/30/2021)   ibuprofen (ADVIL) 200 MG tablet Take 200-400 mg by mouth every 6 (six) hours as needed for mild pain or headache.   losartan (COZAAR) 25 MG tablet Take 25 mg by mouth every evening.    lurasidone (LATUDA) 80 MG TABS tablet Take 1 tablet (80 mg total) by mouth at bedtime.   metFORMIN (GLUCOPHAGE-XR) 500 MG 24 hr tablet Take 1,000 mg by mouth daily before supper.   ondansetron (ZOFRAN-ODT) 4 MG disintegrating tablet Take 1 tablet (4 mg total) by mouth every 8 (eight) hours as needed for nausea or vomiting. (Patient taking differently: Take 4 mg by mouth every 8 (eight) hours as needed for nausea or vomiting (DISSOLVE ORALLY).)   rosuvastatin (CRESTOR) 5 MG tablet Take 5 mg by mouth See admin instructions. Take 5 mg  by mouth every other evening   TRULICITY 1.5 MG/0.5ML SOPN Inject 1.5 mg into the skin every Friday.   No facility-administered encounter medications on file as of 10/30/2022.    Recent Results (from the past 2160 hour(s))  Urinalysis, Routine w reflex microscopic -Urine, Clean Catch     Status: Abnormal   Collection Time: 08/08/22  8:30 PM  Result Value Ref Range   Color, Urine RED (A) YELLOW   APPearance TURBID (A) CLEAR   Specific Gravity, Urine 1.041 (H) 1.005 - 1.030   pH 5.0 5.0 - 8.0   Glucose, UA 150 (A) NEGATIVE mg/dL   Hgb urine dipstick LARGE (A)  NEGATIVE   Bilirubin Urine NEGATIVE NEGATIVE   Ketones, ur 5 (A) NEGATIVE mg/dL   Protein, ur 595 (A) NEGATIVE mg/dL   Nitrite NEGATIVE NEGATIVE   Leukocytes,Ua NEGATIVE NEGATIVE   RBC / HPF >50 0 - 5 RBC/hpf   WBC, UA 0-5 0 - 5 WBC/hpf   Bacteria, UA NONE SEEN NONE SEEN   Squamous Epithelial / HPF 0-5 0 - 5 /HPF   Mucus PRESENT     Comment: Performed at [facility], [address], [place], [place] [address]  Basic metabolic panel     Status: Abnormal   Collection Time: 08/08/22  9:10 PM  Result Value Ref Range   Sodium 136 135 - 145 mmol/L   Potassium 3.3 (L) 3.5 - 5.1 mmol/L   Chloride 103 98 - 111 mmol/L   CO2 22 22 - 32 mmol/L   Glucose, Bld 238 (H) 70 - 99 mg/dL    Comment: Glucose reference range applies only to samples taken after fasting for at least 8 hours.   BUN 20 6 - 20 mg/dL   Creatinine, Ser 6.43 0.44 - 1.00 mg/dL   Calcium 9.3 8.9 - 32.9 mg/dL   GFR, Estimated >51 >88 mL/min    Comment: (NOTE) Calculated using the CKD-EPI Creatinine Equation (2021)    Anion gap 11 5 - 15    Comment: Performed at [facility], [address], [place], [place] [address]  CBC with Differential     Status: None   Collection Time: 08/08/22  9:10 PM  Result Value Ref Range   WBC 8.4 4.0 - 10.5 K/uL   RBC 4.41 3.87 - 5.11 MIL/uL   Hemoglobin 12.9 12.0 - 15.0 g/dL   HCT 63.0 16.0 - 10.9 %   MCV 85.9 80.0 - 100.0 fL   MCH 29.3 26.0 - 34.0 pg   MCHC 34.0 30.0 - 36.0 g/dL   RDW 32.3 55.7 - 32.2 %   Platelets 363 150 - 400 K/uL   nRBC 0.0 0.0 - 0.2 %   Neutrophils Relative % 61 %   Neutro Abs 5.1 1.7 - 7.7 K/uL   Lymphocytes Relative 32 %   Lymphs Abs 2.7 0.7 - 4.0 K/uL   Monocytes Relative 4 %   Monocytes Absolute 0.4 0.1 - 1.0 K/uL   Eosinophils Relative 2 %   Eosinophils Absolute 0.2 0.0 - 0.5 K/uL   Basophils Relative 1 %   Basophils Absolute 0.0 0.0 - 0.1 K/uL   Immature Granulocytes 0 %   Abs Immature Granulocytes 0.02 0.00 - 0.07  K/uL    Comment: Performed at [facility], [address] [place], [place] [address]  hCG, quantitative, pregnancy     Status: None   Collection Time: 08/08/22  9:10 PM  Result Value Ref Range  hCG, Beta Chain, Quant, S <1 <5 mIU/mL    Comment:          GEST. AGE      CONC.  (mIU/mL)   <=1 WEEK        5 - 50     2 WEEKS       50 - 500     3 WEEKS       100 - 10,000     4 WEEKS     1,000 - 30,000     5 WEEKS     3,500 - 115,000   6-8 WEEKS     12,000 - 270,000    12 WEEKS     15,000 - 220,000        FEMALE AND NON-PREGNANT FEMALE:     LESS THAN 5 mIU/mL Performed at [facility], [address] [place], [place] [address]   CBG monitoring, ED     Status: Abnormal   Collection Time: 08/08/22 10:52 PM  Result Value Ref Range   Glucose-Capillary 118 (H) 70 - 99 mg/dL    Comment: Glucose reference range applies only to samples taken after fasting for at least 8 hours.  Lipase, blood     Status: None   Collection Time: 09/14/22  6:42 PM  Result Value Ref Range   Lipase 31 11 - 51 U/L    Comment: Performed at [facility], [address], [place], [place] [address]  Comprehensive metabolic panel     Status: Abnormal   Collection Time: 09/14/22  6:42 PM  Result Value Ref Range   Sodium 138 135 - 145 mmol/L   Potassium 3.5 3.5 - 5.1 mmol/L   Chloride 105 98 - 111 mmol/L   CO2 23 22 - 32 mmol/L   Glucose, Bld 114 (H) 70 - 99 mg/dL    Comment: Glucose reference range applies only to samples taken after fasting for at least 8 hours.   BUN 13 6 - 20 mg/dL   Creatinine, Ser 0.98 0.44 - 1.00 mg/dL   Calcium 9.2 8.9 - 11.9 mg/dL   Total Protein 8.2 (H) 6.5 - 8.1 g/dL   Albumin 4.4 3.5 - 5.0 g/dL   AST 22 15 - 41 U/L   ALT 30 0 - 44 U/L   Alkaline Phosphatase 59 38 - 126 U/L   Total Bilirubin 0.7 0.3 - 1.2 mg/dL   GFR, Estimated >14 >78 mL/min    Comment: (NOTE) Calculated using the CKD-EPI Creatinine Equation (2021)     Anion gap 10 5 - 15    Comment: Performed at [facility], [address], [place], [place] [address]  CBC     Status: None   Collection Time: 09/14/22  6:42 PM  Result Value Ref Range   WBC 9.2 4.0 - 10.5 K/uL   RBC 4.61 3.87 - 5.11 MIL/uL   Hemoglobin 13.6 12.0 - 15.0 g/dL   HCT 13.0 86.5 - 78.4 %   MCV 85.5 80.0 - 100.0 fL   MCH 29.5 26.0 - 34.0 pg   MCHC 34.5 30.0 - 36.0 g/dL   RDW 69.6 29.5 - 28.4 %   Platelets 350 150 - 400 K/uL   nRBC 0.0 0.0 - 0.2 %    Comment: Performed at [facility], [address], [place], [place] [address]  Urinalysis, Routine w reflex microscopic -Urine, Clean Catch     Status: Abnormal   Collection Time: 09/14/22  6:46 PM  Result Value Ref Range   Color, Urine YELLOW YELLOW   APPearance HAZY (A) CLEAR   Specific Gravity, Urine 1.027 1.005 - 1.030   pH 5.0 5.0 - 8.0   Glucose, UA NEGATIVE NEGATIVE mg/dL   Hgb urine dipstick SMALL (A) NEGATIVE   Bilirubin Urine NEGATIVE NEGATIVE   Ketones, ur NEGATIVE NEGATIVE mg/dL   Protein, ur 30 (A) NEGATIVE mg/dL   Nitrite NEGATIVE NEGATIVE   Leukocytes,Ua NEGATIVE NEGATIVE   RBC / HPF 21-50 0 - 5 RBC/hpf   WBC, UA 0-5 0 - 5 WBC/hpf   Bacteria, UA RARE (A) NONE SEEN   Squamous Epithelial / HPF 11-20 0 - 5 /HPF   Mucus PRESENT     Comment: Performed at [facility], [address] [place], [place] [address]     Psychiatric Specialty Exam: Physical Exam  Review of Systems  Weight 225 lb (102.1 kg), last menstrual period 12/27/2020.There is no height or weight on file to calculate BMI.  General Appearance: NA  Eye Contact:  NA  Speech:  Normal Rate  Volume:  Normal  Mood:  Anxious  Affect:  NA  Thought Process:  Goal Directed  Orientation:  Full (Time, Place, and Person)  Thought Content:  Rumination  Suicidal Thoughts:  No  Homicidal Thoughts:  No  Memory:  Immediate;   Good Recent;   Good Remote;   Good  Judgement:  Intact  Insight:   Present  Psychomotor Activity:  Normal  Concentration:  Concentration: Good and Attention Span: Good  Recall:  Good  Fund of Knowledge:  Good  Language:  Good  Akathisia:  No  Handed:  Right  AIMS (if indicated):     Assets:  Communication Skills Desire for Improvement Housing Resilience Social Support  ADL's:  Intact  Cognition:  WNL  Sleep:  7     Assessment/Plan: Bipolar 1 disorder, mixed (HCC) - Plan: lurasidone (LATUDA) 80 MG TABS tablet, clonazePAM (KLONOPIN) 1 MG tablet  Anxiety - Plan: lurasidone (LATUDA) 80 MG TABS tablet, clonazePAM (KLONOPIN) 1 MG tablet  Patient reported episodes of anxiety and panic attack which she believes related to financial strain.  She used to take the Klonopin but not in a while.  She tried hydroxyzine but that made her worse.  Recommend to go back to Klonopin 0.5 mg to 1 mg as needed for severe panic attack.  Continue Latuda 80 mg daily.  If symptoms do not improve then we will consider optimizing Latuda dose.  She is not interested in therapy as she believes she had a good support system and does not need counseling.  I also reviewed blood work results from recent emergency room.  Labs are stable.  Recommended to call us back if is any question or any concern.  Follow-up in 3 months   Follow Up Instructions:     I discussed the assessment and treatment plan with the patient. The patient was provided an opportunity to ask questions and all were answered. The patient agreed with the plan and demonstrated an understanding of the instructions.   The patient was advised to call back or seek an in-person evaluation if the symptoms worsen or if the condition fails to improve as anticipated.    Collaboration of Care: Other provider involved in patient's care AEB notes are available in epic to review.  Patient/Guardian was advised Release of Information must be obtained prior to any record release in order to collaborate their care with  an outside  provider. Patient/Guardian was advised if they have not already done so to contact the registration department to sign all necessary forms in order for [place] to release information regarding their care.   Consent: Patient/Guardian gives verbal consent for treatment and assignment of benefits for services provided during this visit. Patient/Guardian expressed understanding and agreed to proceed.     I provided 18 minutes of non face to face time during this encounter.  Note: This document was prepared by [employer] voice dictation technology and any errors that results from this process are unintentional.    Cleotis Nipper, MD 10/30/2022

## 2022-11-03 ENCOUNTER — Ambulatory Visit
Admission: RE | Admit: 2022-11-03 | Discharge: 2022-11-03 | Disposition: A | Discharge status: Home / Self Care | Payer: Medicaid Other | Source: Ambulatory Visit | Attending: Family Medicine | Admitting: Family Medicine

## 2022-11-03 DIAGNOSIS — Z1231 Encounter for screening mammogram for malignant neoplasm of breast: Secondary | ICD-10-CM

## 2023-01-30 ENCOUNTER — Telehealth (HOSPITAL_BASED_OUTPATIENT_CLINIC_OR_DEPARTMENT_OTHER): Payer: Medicaid Other | Admitting: Psychiatry

## 2023-01-30 ENCOUNTER — Encounter (HOSPITAL_COMMUNITY): Payer: Self-pay | Admitting: Psychiatry

## 2023-01-30 VITALS — Wt 224.0 lb

## 2023-01-30 DIAGNOSIS — F419 Anxiety disorder, unspecified: Secondary | ICD-10-CM

## 2023-01-30 DIAGNOSIS — F3160 Bipolar disorder, current episode mixed, unspecified: Secondary | ICD-10-CM | POA: Diagnosis not present

## 2023-01-30 MED ORDER — LURASIDONE HCL 120 MG PO TABS: 120.0000 mg | ORAL_TABLET | Freq: Every day | ORAL | 2 refills | Status: DC

## 2023-01-30 NOTE — Progress Notes (Signed)
[facility] MD Virtual Progress Note   Patient Location: Home Provider Location: Home Office  I connect with patient by video and verified that I am speaking with correct person by using two identifiers. I discussed the limitations of evaluation and management by telemedicine and the availability of in person appointments. I also discussed with the patient that there may be a patient responsible charge related to this service. The patient expressed understanding and agreed to proceed.  Vicki Huynh [id number] 42 y.o.  01/30/2023 10:44 AM  History of Present Illness:  The patient was evaluated by video session.  She reported feeling more sad, depressed.  She is not sure what triggered but reported poor sleep and waking up frequently.  She reported financial strain and sometime behind bills.  She is taking Latuda 80 mg and denies any side effects.  She has not taken Klonopin because it makes her very groggy and sleepy.  We have tried hydroxyzine however the same side effects that she has been groggy next day she had decided not to take it.  She also concerned about her weight as despite taking the medication for diabetes has not able to lose weight as such.  Her hemoglobin A1c is better.  She is going to talk to her primary care to consider a different medication.  Patient denies any mania, psychosis, hallucination.  She denies any suicidal thoughts.  She lives with her 41 year old and 14 year old.  Her husband is very cooperative.  Her plan is to visit her brother-in-law and mother-in-law during the holidays.  She is wondering if she can try higher dose of Latuda.  So far she is tolerating well and reported no side effects.  Past Psychiatric History: H/O depression, mood swings, irritability, mania and impulsive behavior.  PCP prescribed Effexor in 2012 worked for a while. Tried Lamictal (hallucination), Buspar (groggy) . H/O ETOH and THC but sober since 2009.  No h/o inpatient or  any suicidal attempt.      Outpatient Encounter Medications as of 01/30/2023  Medication Sig   ACCU-CHEK FASTCLIX LANCETS MISC    ACCU-CHEK GUIDE test strip    acetaminophen (TYLENOL) 500 MG tablet Take 2 tablets (1,000 mg total) by mouth every 8 (eight) hours as needed for mild pain or moderate pain.   bismuth subsalicylate (PEPTO BISMOL) 262 MG chewable tablet Chew 524 mg by mouth daily as needed for indigestion.   clonazePAM (KLONOPIN) 1 MG tablet Take 1 tablet (1 mg total) by mouth daily as needed for anxiety.   dicyclomine (BENTYL) 20 MG tablet Take 1 tablet (20 mg total) by mouth 2 (two) times daily as needed for up to 20 doses for spasms.   esomeprazole (NEXIUM) 40 MG capsule Take 40 mg by mouth 2 (two) times daily before a meal.   glimepiride (AMARYL) 4 MG tablet Take 4 mg by mouth at bedtime.   ibuprofen (ADVIL) 200 MG tablet Take 200-400 mg by mouth every 6 (six) hours as needed for mild pain or headache.   losartan (COZAAR) 25 MG tablet Take 25 mg by mouth every evening.    lurasidone (LATUDA) 80 MG TABS tablet Take 1 tablet (80 mg total) by mouth at bedtime.   metFORMIN (GLUCOPHAGE-XR) 500 MG 24 hr tablet Take 1,000 mg by mouth daily before supper.   ondansetron (ZOFRAN-ODT) 4 MG disintegrating tablet Take 1 tablet (4 mg total) by mouth every 8 (eight) hours as needed for nausea or vomiting. (Patient taking differently: Take 4 mg by mouth  every 8 (eight) hours as needed for nausea or vomiting (DISSOLVE ORALLY).)   rosuvastatin (CRESTOR) 5 MG tablet Take 5 mg by mouth See admin instructions. Take 5 mg by mouth every other evening   TRULICITY 1.5 MG/0.5ML SOPN Inject 1.5 mg into the skin every Friday.   No facility-administered encounter medications on file as of 01/30/2023.    No results found for this or any previous visit (from the past 2160 hour(s)).   Psychiatric Specialty Exam: Physical Exam  Review of Systems  Weight 224 lb (101.6 kg), last menstrual period  12/27/2020.There is no height or weight on file to calculate BMI.  General Appearance: Casual  Eye Contact:  Good  Speech:  Clear and Coherent  Volume:  Normal  Mood:  Dysphoric  Affect:  Congruent  Thought Process:  Goal Directed  Orientation:  Full (Time, Place, and Person)  Thought Content:  WDL  Suicidal Thoughts:  No  Homicidal Thoughts:  No  Memory:  Immediate;   Good Recent;   Good Remote;   Good  Judgement:  Intact  Insight:  Present  Psychomotor Activity:  Normal  Concentration:  Concentration: Good and Attention Span: Good  Recall:  Good  Fund of Knowledge:  Good  Language:  Good  Akathisia:  No  Handed:  Right  AIMS (if indicated):     Assets:  Communication Skills Desire for Improvement Housing Social Support Transportation  ADL's:  Intact  Cognition:  WNL  Sleep:  frequent awakening.      Assessment/Plan: Bipolar 1 disorder, mixed (HCC) - Plan: Lurasidone HCl (LATUDA) 120 MG TABS  Anxiety - Plan: Lurasidone HCl (LATUDA) 120 MG TABS  Patient tried Klonopin but is still making very sleepy and groggy.  She like to go up on Latuda since she noticed her mood has been stable and increasing the dose may help her depression.  She is not taking hydroxyzine.  So far tolerating well with the medication.  We will try Latuda 120 mg daily.  I recommend to call us back if she has any question, concern or if she feels worsening of her symptoms.  Follow-up in 3 months.   Follow Up Instructions:     I discussed the assessment and treatment plan with the patient. The patient was provided an opportunity to ask questions and all were answered. The patient agreed with the plan and demonstrated an understanding of the instructions.   The patient was advised to call back or seek an in-person evaluation if the symptoms worsen or if the condition fails to improve as anticipated.    Collaboration of Care: Other provider involved in patient's care AEB notes are available in epic  to review  Patient/Guardian was advised Release of Information must be obtained prior to any record release in order to collaborate their care with an outside provider. Patient/Guardian was advised if they have not already done so to contact the registration department to sign all necessary forms in order for [place] to release information regarding their care.   Consent: Patient/Guardian gives verbal consent for treatment and assignment of benefits for services provided during this visit. Patient/Guardian expressed understanding and agreed to proceed.     I provided 16 minutes of non face to face time during this encounter.  Note: This document was prepared by [employer] voice dictation technology and any errors that results from this process are unintentional.    Cleotis Nipper, MD 01/30/2023

## 2023-04-16 ENCOUNTER — Other Ambulatory Visit (HOSPITAL_COMMUNITY): Payer: Self-pay | Admitting: Psychiatry

## 2023-04-16 DIAGNOSIS — F419 Anxiety disorder, unspecified: Secondary | ICD-10-CM

## 2023-04-16 DIAGNOSIS — F3160 Bipolar disorder, current episode mixed, unspecified: Secondary | ICD-10-CM

## 2023-04-21 ENCOUNTER — Other Ambulatory Visit (HOSPITAL_COMMUNITY): Payer: Self-pay

## 2023-04-21 DIAGNOSIS — F419 Anxiety disorder, unspecified: Secondary | ICD-10-CM

## 2023-04-21 DIAGNOSIS — F3160 Bipolar disorder, current episode mixed, unspecified: Secondary | ICD-10-CM

## 2023-04-21 MED ORDER — LURASIDONE HCL 120 MG PO TABS
120.0000 mg | ORAL_TABLET | Freq: Every day | ORAL | 0 refills | Status: DC
Start: 2023-04-21 — End: 2023-05-04

## 2023-05-04 ENCOUNTER — Telehealth (HOSPITAL_BASED_OUTPATIENT_CLINIC_OR_DEPARTMENT_OTHER): Payer: Medicaid Other | Admitting: Psychiatry

## 2023-05-04 ENCOUNTER — Encounter (HOSPITAL_COMMUNITY): Payer: Self-pay | Admitting: Psychiatry

## 2023-05-04 VITALS — Wt 204.0 lb

## 2023-05-04 DIAGNOSIS — F419 Anxiety disorder, unspecified: Secondary | ICD-10-CM

## 2023-05-04 DIAGNOSIS — F3160 Bipolar disorder, current episode mixed, unspecified: Secondary | ICD-10-CM

## 2023-05-04 MED ORDER — HYDROXYZINE HCL 10 MG PO TABS
10.0000 mg | ORAL_TABLET | Freq: Every day | ORAL | 2 refills | Status: DC | PRN
Start: 2023-05-04 — End: 2023-08-03

## 2023-05-04 MED ORDER — LURASIDONE HCL 120 MG PO TABS: 120.0000 mg | ORAL_TABLET | Freq: Every day | ORAL | 2 refills | Status: DC

## 2023-05-04 NOTE — Progress Notes (Signed)
[facility] MD Virtual Progress Note   Patient Location: Home Provider Location: Home Office  I connect with patient by video and verified that I am speaking with correct person by using two identifiers. I discussed the limitations of evaluation and management by telemedicine and the availability of in person appointments. I also discussed with the patient that there may be a patient responsible charge related to this service. The patient expressed understanding and agreed to proceed.  Vicki Huynh [id number] 43 y.o.  05/04/2023 10:41 AM  History of Present Illness:  Patient is evaluated by video session.  She reported some anxiety and nervousness but otherwise increase Latuda helping and keeping her mood stable.  She is taking Latuda 120 mg.  Recently she had blood work and she was very pleased that her hemoglobin A1c dropped 5.8.  Her primary care is Sigmund Hazel.  She is taking Mounjaro which is helping her blood sugar and weight.  She denies any crying spells or any feeling of hopelessness or worthlessness.  Her 42 year old and 42 year old doing home schooling.  Patient lives with her husband is very cooperative.  She denies any mania, agitation, anger or any hallucination.  She endorsed some anxiety and nervousness especially at nighttime when she cannot sleep well.  In the past she had tried Klonopin, BuSpar which make her very groggy and sleepy.  She tried melatonin but could not afford because it is over-the-counter.  We have tried hydroxyzine which worked very well but higher dose because excessive sedation.  She like to go back on low-dose hydroxyzine.  Patient denies drinking or using any illegal substances.  Past Psychiatric History: H/O depression, mood swings, irritability, mania and impulsive behavior.  PCP prescribed Effexor in 2012 worked for a while. Tried Lamictal (hallucination), Buspar (groggy) . H/O ETOH and THC but sober since 2009.  No h/o inpatient or any  suicidal attempt.      Outpatient Encounter Medications as of 05/04/2023  Medication Sig   ACCU-CHEK FASTCLIX LANCETS MISC    ACCU-CHEK GUIDE test strip    acetaminophen (TYLENOL) 500 MG tablet Take 2 tablets (1,000 mg total) by mouth every 8 (eight) hours as needed for mild pain or moderate pain.   bismuth subsalicylate (PEPTO BISMOL) 262 MG chewable tablet Chew 524 mg by mouth daily as needed for indigestion.   clonazePAM (KLONOPIN) 1 MG tablet Take 1 tablet (1 mg total) by mouth daily as needed for anxiety.   dicyclomine (BENTYL) 20 MG tablet Take 1 tablet (20 mg total) by mouth 2 (two) times daily as needed for up to 20 doses for spasms.   esomeprazole (NEXIUM) 40 MG capsule Take 40 mg by mouth 2 (two) times daily before a meal.   glimepiride (AMARYL) 4 MG tablet Take 4 mg by mouth at bedtime.   ibuprofen (ADVIL) 200 MG tablet Take 200-400 mg by mouth every 6 (six) hours as needed for mild pain or headache.   losartan (COZAAR) 25 MG tablet Take 25 mg by mouth every evening.    Lurasidone HCl (LATUDA) 120 MG TABS Take 1 tablet (120 mg total) by mouth at bedtime.   metFORMIN (GLUCOPHAGE-XR) 500 MG 24 hr tablet Take 1,000 mg by mouth daily before supper.   ondansetron (ZOFRAN-ODT) 4 MG disintegrating tablet Take 1 tablet (4 mg total) by mouth every 8 (eight) hours as needed for nausea or vomiting. (Patient taking differently: Take 4 mg by mouth every 8 (eight) hours as needed for nausea or vomiting (DISSOLVE ORALLY).)  rosuvastatin (CRESTOR) 5 MG tablet Take 5 mg by mouth See admin instructions. Take 5 mg by mouth every other evening   TRULICITY 1.5 MG/0.5ML SOPN Inject 1.5 mg into the skin every Friday.   No facility-administered encounter medications on file as of 05/04/2023.    No results found for this or any previous visit (from the past 2160 hours).   Psychiatric Specialty Exam: Physical Exam  Review of Systems  Psychiatric/Behavioral:  Positive for sleep disturbance. The patient is  nervous/anxious.     Weight 204 lb (92.5 kg), last menstrual period 12/27/2020.There is no height or weight on file to calculate BMI.  General Appearance: Casual  Eye Contact:  Good  Speech:  Normal Rate  Volume:  Normal  Mood:  Anxious  Affect:  Appropriate  Thought Process:  Goal Directed  Orientation:  Full (Time, Place, and Person)  Thought Content:  Rumination  Suicidal Thoughts:  No  Homicidal Thoughts:  No  Memory:  Immediate;   Good Recent;   Good Remote;   Fair  Judgement:  Intact  Insight:  Present  Psychomotor Activity:  Normal  Concentration:  Concentration: Good and Attention Span: Good  Recall:  Good  Fund of Knowledge:  Good  Language:  Good  Akathisia:  No  Handed:  Right  AIMS (if indicated):     Assets:  Communication Skills Desire for Improvement Housing Resilience Social Support Transportation  ADL's:  Intact  Cognition:  WNL  Sleep:  fair     Assessment/Plan: Bipolar 1 disorder, mixed (HCC) - Plan: Lurasidone HCl (LATUDA) 120 MG TABS, hydrOXYzine (ATARAX) 10 MG tablet  Anxiety - Plan: Lurasidone HCl (LATUDA) 120 MG TABS, hydrOXYzine (ATARAX) 10 MG tablet  Patient is a stable on her current medication however have anxiety, insomnia.  Recommend to consider sleep study but patient also like to go back on hydroxyzine which can help her anxiety during the day.  In the past she has taken up to 50 mg but will try 10 mg which she can take at bedtime.  She has not want to see a therapist.  She did not like Latuda which is helping her mood.  I reviewed blood work results.  Last hemoglobin A1c 5.8.  She also lost 20 pounds since on [place].  She has now tremors shakes or any EPS.  Continue Latuda 120 mg daily and hydroxyzine 10 mg and I recommend if any worsening of symptoms or could not tolerate it and call us back.  She will contact her PCP to consider sleep study.  Follow-up in 3 months   Follow Up Instructions:     I discussed the assessment and  treatment plan with the patient. The patient was provided an opportunity to ask questions and all were answered. The patient agreed with the plan and demonstrated an understanding of the instructions.   The patient was advised to call back or seek an in-person evaluation if the symptoms worsen or if the condition fails to improve as anticipated.    Collaboration of Care: Other provider involved in patient's care AEB notes are available in epic to review  Patient/Guardian was advised Release of Information must be obtained prior to any record release in order to collaborate their care with an outside provider. Patient/Guardian was advised if they have not already done so to contact the registration department to sign all necessary forms in order for [place] to release information regarding their care.   Consent: Patient/Guardian gives verbal consent for treatment and  assignment of benefits for services provided during this visit. Patient/Guardian expressed understanding and agreed to proceed.     I provided 23 minutes of non face to face time during this encounter.  Note: This document was prepared by [employer] voice dictation technology and any errors that results from this process are unintentional.    Cleotis Nipper, MD 05/04/2023

## 2023-05-19 ENCOUNTER — Encounter (HOSPITAL_COMMUNITY): Payer: Self-pay

## 2023-05-26 ENCOUNTER — Other Ambulatory Visit (HOSPITAL_COMMUNITY): Payer: Self-pay | Admitting: Psychiatry

## 2023-05-26 DIAGNOSIS — F419 Anxiety disorder, unspecified: Secondary | ICD-10-CM

## 2023-05-26 DIAGNOSIS — F3160 Bipolar disorder, current episode mixed, unspecified: Secondary | ICD-10-CM

## 2023-06-23 ENCOUNTER — Other Ambulatory Visit (HOSPITAL_COMMUNITY): Payer: Self-pay | Admitting: Psychiatry

## 2023-06-23 DIAGNOSIS — F419 Anxiety disorder, unspecified: Secondary | ICD-10-CM

## 2023-06-23 DIAGNOSIS — F3160 Bipolar disorder, current episode mixed, unspecified: Secondary | ICD-10-CM

## 2023-07-08 ENCOUNTER — Other Ambulatory Visit (HOSPITAL_COMMUNITY): Payer: Self-pay | Admitting: Psychiatry

## 2023-07-08 DIAGNOSIS — F3160 Bipolar disorder, current episode mixed, unspecified: Secondary | ICD-10-CM

## 2023-07-08 DIAGNOSIS — F419 Anxiety disorder, unspecified: Secondary | ICD-10-CM

## 2023-08-03 ENCOUNTER — Telehealth (HOSPITAL_BASED_OUTPATIENT_CLINIC_OR_DEPARTMENT_OTHER): Payer: Medicaid Other | Admitting: Psychiatry

## 2023-08-03 ENCOUNTER — Encounter (HOSPITAL_COMMUNITY): Payer: Self-pay | Admitting: Psychiatry

## 2023-08-03 DIAGNOSIS — F3160 Bipolar disorder, current episode mixed, unspecified: Secondary | ICD-10-CM | POA: Diagnosis not present

## 2023-08-03 DIAGNOSIS — F419 Anxiety disorder, unspecified: Secondary | ICD-10-CM | POA: Diagnosis not present

## 2023-08-03 MED ORDER — LURASIDONE HCL 120 MG PO TABS: 120.0000 mg | ORAL_TABLET | Freq: Every day | ORAL | 2 refills | Status: DC

## 2023-08-03 MED ORDER — HYDROXYZINE HCL 10 MG PO TABS: 10.0000 mg | ORAL_TABLET | Freq: Every day | ORAL | 2 refills | Status: DC | PRN

## 2023-08-03 NOTE — Progress Notes (Signed)
  [facility] MD Virtual Progress Note   Patient Location: Home Provider Location: Home Office  I connect with patient by video and verified that I am speaking with correct person by using two identifiers. I discussed the limitations of evaluation and management by telemedicine and the availability of in person appointments. I also discussed with the patient that there may be a patient responsible charge related to this service. The patient expressed understanding and agreed to proceed.  Vicki Huynh [id number] 43 y.o.  08/03/2023 10:38 AM  History of Present Illness:  Patient is evaluated by video session.  She reported things are going very well.  She lost another 5 pounds since the last session.  She is on Mounjaro and her last hemoglobin A1c 5.6 which was done by her primary care Perley Bradley at [facility] physician.  She is taking low-dose hydroxyzine  every night which is helping her sleep.  We have tried higher dose but it makes her very groggy.  Overall she feels that her anxiety depression is stable.  She denies any mania, psychosis, irritability or any hallucination.  She has no tremors shakes or any EPS.  We have recommended sleep study but patient is trying to lose weight first and she noticed sleep is improved.  Patient lives with her husband and 77 and 38 year old who does home schooling.  She is not sure if her 42 years old go to college as a still looking into to make decision.  Patient denies drinking or using any illegal substances.  She like to keep the current medication.  Past Psychiatric History: H/O depression, mood swings, irritability, mania and impulsive behavior.  PCP prescribed Effexor in 2012 worked for a while. Tried Lamictal (hallucination), Buspar  (groggy), Klonopin , higher dose of Vistaril  and melatonin. H/O ETOH and THC but sober since 2009.  No h/o inpatient or any suicidal attempt.      Outpatient Encounter Medications as of 08/03/2023  Medication Sig    ACCU-CHEK FASTCLIX LANCETS MISC    ACCU-CHEK GUIDE test strip    acetaminophen  (TYLENOL ) 500 MG tablet Take 2 tablets (1,000 mg total) by mouth every 8 (eight) hours as needed for mild pain or moderate pain.   bismuth subsalicylate (PEPTO BISMOL) 262 MG chewable tablet Chew 524 mg by mouth daily as needed for indigestion.   dicyclomine  (BENTYL ) 20 MG tablet Take 1 tablet (20 mg total) by mouth 2 (two) times daily as needed for up to 20 doses for spasms.   esomeprazole (NEXIUM) 40 MG capsule Take 40 mg by mouth 2 (two) times daily before a meal.   hydrOXYzine  (ATARAX ) 10 MG tablet Take 1 tablet (10 mg total) by mouth daily as needed for anxiety.   ibuprofen  (ADVIL ) 200 MG tablet Take 200-400 mg by mouth every 6 (six) hours as needed for mild pain or headache.   losartan  (COZAAR ) 25 MG tablet Take 25 mg by mouth every evening.    Lurasidone  HCl (LATUDA ) 120 MG TABS Take 1 tablet (120 mg total) by mouth at bedtime.   metFORMIN (GLUCOPHAGE-XR) 500 MG 24 hr tablet Take 1,000 mg by mouth daily before supper.   MOUNJARO 5 MG/0.5ML Pen Inject 5 mg into the skin once a week.   ondansetron  (ZOFRAN -ODT) 4 MG disintegrating tablet Take 1 tablet (4 mg total) by mouth every 8 (eight) hours as needed for nausea or vomiting. (Patient taking differently: Take 4 mg by mouth every 8 (eight) hours as needed for nausea or vomiting (DISSOLVE ORALLY).)   rosuvastatin (  CRESTOR) 5 MG tablet Take 5 mg by mouth See admin instructions. Take 5 mg by mouth every other evening   No facility-administered encounter medications on file as of 08/03/2023.    No results found for this or any previous visit (from the past 2160 hours).   Psychiatric Specialty Exam: Physical Exam  Review of Systems  Weight 199 lb (90.3 kg), last menstrual period 12/27/2020.There is no height or weight on file to calculate BMI.  General Appearance: Casual  Eye Contact:  Good  Speech:  Clear and Coherent and Normal Rate  Volume:  Normal  Mood:   Euthymic  Affect:  Appropriate  Thought Process:  Goal Directed  Orientation:  Full (Time, Place, and Person)  Thought Content:  Logical  Suicidal Thoughts:  No  Homicidal Thoughts:  No  Memory:  Immediate;   Good Recent;   Good Remote;   Good  Judgement:  Good  Insight:  Good  Psychomotor Activity:  Normal  Concentration:  Concentration: Good and Attention Span: Good  Recall:  Good  Fund of Knowledge:  Good  Language:  Good  Akathisia:  No  Handed:  Right  AIMS (if indicated):     Assets:  Communication Skills Desire for Improvement Housing Social Support Transportation  ADL's:  Intact  Cognition:  WNL  Sleep:  better with Hydroxyzine         06/04/2020    1:17 PM 01/06/2019   10:28 AM 01/31/2016    9:26 AM  Depression screen PHQ 2/9  Decreased Interest 1 0 0  Down, Depressed, Hopeless 0 0 0  PHQ - 2 Score 1 0 0    Assessment/Plan: Anxiety - Plan: hydrOXYzine  (ATARAX ) 10 MG tablet, Lurasidone  HCl (LATUDA ) 120 MG TABS  Bipolar 1 disorder, mixed (HCC) - Plan: hydrOXYzine  (ATARAX ) 10 MG tablet, Lurasidone  HCl (LATUDA ) 120 MG TABS  Patient is stable on low-dose hydroxyzine  10 mg at bedtime and Latuda  120 mg daily.  She had lost another 5 pound.  She is on Mounjaro and her last hemoglobin A1c 5.6.  Her anxiety and mood symptoms are stable.  Continue Latuda  120 mg daily and hydroxyzine  10 mg at bedtime.  Patient will look into sleep study if she continues to have insomnia but so far symptoms are not intense and manageable.  Recommend to call us  back if she has any question or any concern.  Follow-up in 3 months   Follow Up Instructions:     I discussed the assessment and treatment plan with the patient. The patient was provided an opportunity to ask questions and all were answered. The patient agreed with the plan and demonstrated an understanding of the instructions.   The patient was advised to call back or seek an in-person evaluation if the symptoms worsen or if the  condition fails to improve as anticipated.    Collaboration of Care: Other provider involved in patient's care AEB notes are available in epic to review  Patient/Guardian was advised Release of Information must be obtained prior to any record release in order to collaborate their care with an outside provider. Patient/Guardian was advised if they have not already done so to contact the registration department to sign all necessary forms in order for us  to release information regarding their care.   Consent: Patient/Guardian gives verbal consent for treatment and assignment of benefits for services provided during this visit. Patient/Guardian expressed understanding and agreed to proceed.     Total encounter time 16 minutes which includes face-to-face time,  chart reviewed, care coordination, order entry and documentation during this encounter.   Note: This document was prepared by [employer] voice dictation technology and any errors that results from this process are unintentional.    Arturo Late, MD 08/03/2023

## 2023-11-02 ENCOUNTER — Telehealth (HOSPITAL_BASED_OUTPATIENT_CLINIC_OR_DEPARTMENT_OTHER): Admitting: Psychiatry

## 2023-11-02 ENCOUNTER — Encounter (HOSPITAL_COMMUNITY): Payer: Self-pay | Admitting: Psychiatry

## 2023-11-02 DIAGNOSIS — F3160 Bipolar disorder, current episode mixed, unspecified: Secondary | ICD-10-CM

## 2023-11-02 DIAGNOSIS — F419 Anxiety disorder, unspecified: Secondary | ICD-10-CM | POA: Diagnosis not present

## 2023-11-02 MED ORDER — LURASIDONE HCL 120 MG PO TABS: 120.0000 mg | ORAL_TABLET | Freq: Every day | ORAL | 2 refills | Status: DC

## 2023-11-02 NOTE — Progress Notes (Signed)
 [facility] MD Virtual Progress Note   Patient Location: Home Provider Location: Home Office  I connect with patient by video and verified that I am speaking with correct person by using two identifiers. I discussed the limitations of evaluation and management by telemedicine and the availability of in person appointments. I also discussed with the patient that there may be a patient responsible charge related to this service. The patient expressed understanding and agreed to proceed.  Vicki Huynh [id number] 43 y.o.  11/02/2023 10:26 AM  History of Present Illness:  Patient is evaluated by video session.  She reported stop taking the hydroxyzine  because it was making her very sleepy in the morning.  She reported having GERD symptoms and now taking the Nexium every night which helps her nausea but has to take also in the morning with sometimes she forgets.  Patient denies any mania, agitation, highs and lows in her mood.  She denies any hallucination, paranoia or any suicidal thoughts.  Patient lives with her husband and her 34 and 77 year old.  She is still not sure if 42 year old will go to college.  Her kids do home schooling.  Patient denies drinking or using any illegal substances.  She admitted sometime anxious and nervous specially at night and she has leftover Klonopin  which she can take if needed.  She does not need hydroxyzine  but like to continue Latuda  is keeping her mood stable.  She has good support from her husband.  Patient is taking Mounjaro that is keeping her weight down.  She lost another 10 pounds since the last visit.  Patient like to keep the Latuda .  She denies any tremors, shakes or any EPS.  Past Psychiatric History: H/O depression, mood swings, irritability, mania and impulsive behavior.  PCP prescribed Effexor in 2012 worked for a while. Tried Lamictal (hallucination), Buspar  (groggy), Klonopin , higher dose of Vistaril  and melatonin. H/O ETOH and THC but  sober since 2009.  No h/o inpatient or any suicidal attempt.     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   bismuth subsalicylate (PEPTO BISMOL) 262 MG chewable tablet Chew 524 mg by mouth daily as needed for indigestion.   dicyclomine  (BENTYL ) 20 MG tablet Take 1 tablet (20 mg total) by mouth 2 (two) times daily as needed for up to 20 doses for spasms.   esomeprazole (NEXIUM) 40 MG capsule Take 40 mg by mouth 2 (two) times daily before a meal.   hydrOXYzine  (ATARAX ) 10 MG tablet Take 1 tablet (10 mg total) by mouth daily as needed for anxiety.   ibuprofen  (ADVIL ) 200 MG tablet Take 200-400 mg by mouth every 6 (six) hours as needed for mild pain or headache.  losartan  (COZAAR ) 25 MG tablet Take 25 mg by mouth every evening.    Lurasidone  HCl (LATUDA ) 120 MG TABS Take 1 tablet (120 mg total) by mouth at bedtime.   metFORMIN (GLUCOPHAGE-XR) 500 MG 24 hr tablet Take 1,000 mg by mouth daily before supper.   MOUNJARO 5 MG/0.5ML Pen Inject 5 mg into the skin once a week.   ondansetron  (ZOFRAN -ODT) 4 MG disintegrating tablet Take 1 tablet (4 mg total) by mouth every 8 (eight) hours as needed for nausea or vomiting. (Patient taking differently: Take 4 mg by mouth every 8 (eight) hours as needed for nausea or vomiting (DISSOLVE ORALLY).)   rosuvastatin (CRESTOR) 5 MG  tablet Take 5 mg by mouth See admin instructions. Take 5 mg by mouth every other evening   No facility-administered encounter medications on file as of 11/02/2023.    No results found for this or any previous visit (from the past 2160 hours).   Psychiatric Specialty Exam: Physical Exam  Review of Systems  Gastrointestinal:  Positive for nausea.    Weight 183 lb (83 kg), last menstrual period 12/27/2020.There is no height or weight on file to calculate BMI.  General Appearance: Casual  Eye Contact:  Good  Speech:  Clear and Coherent  Volume:  Normal  Mood:  Euthymic  Affect:  Appropriate  Thought Process:  Goal Directed  Orientation:  Full (Time, Place, and Person)  Thought Content:  WDL  Suicidal Thoughts:  No  Homicidal Thoughts:  No  Memory:  Immediate;   Good Recent;   Good Remote;   Good  Judgement:  Good  Insight:  Present  Psychomotor Activity:  Normal  Concentration:  Concentration: Good and Attention Span: Good  Recall:  Good  Fund of Knowledge:  Good  Language:  Good  Akathisia:  No  Handed:  Right  AIMS (if indicated):     Assets:  Communication Skills Desire for Improvement Housing Social Support Transportation  ADL's:  Intact  Cognition:  WNL  Sleep:  fair       06/04/2020    1:17 PM 01/06/2019   10:28 AM 01/31/2016    9:26 AM  Depression screen PHQ 2/9  Decreased Interest 1 0 0  Down, Depressed, Hopeless 0 0 0  PHQ - 2 Score 1 0 0    Assessment/Plan: Anxiety - Plan: Lurasidone  HCl (LATUDA ) 120 MG TABS  Bipolar 1 disorder, mixed (HCC) - Plan: Lurasidone  HCl (LATUDA ) 120 MG TABS  Patient is no longer taking hydroxyzine  because of sedation in the morning.  Her anxiety is not as bad and her mood is stable.  Continue Latuda  120 mg daily.  She lost another 10 pounds since [place] helping with weight loss.  She mention has leftover Klonopin  but is still not expired and she can take it if needed for severe anxiety.  We discussed benzodiazepine dependence  tolerance and withdrawal.  Encouraged to call back if she feels her anxiety is worse.  At this time she only like to continue Latuda  120 mg daily.  Recommend to call back if she has any question or any concern.  Follow-up in 3 months.   Follow Up Instructions:     I discussed the assessment and treatment plan with the patient. The patient was provided an opportunity to ask questions and all were answered. The patient agreed with the plan and demonstrated an understanding of the instructions.   The patient was advised to call back or seek an in-person evaluation if the symptoms  worsen or if the condition fails to improve as anticipated.    Collaboration of Care: Other provider involved in patient's care AEB notes are available in epic to review  Patient/Guardian was advised Release of Information must be obtained prior to any record release in order to collaborate their care with an outside provider. Patient/Guardian was advised if they have not already done so to contact the registration department to sign all necessary forms in order for us  to release information regarding their care.   Consent: Patient/Guardian gives verbal consent for treatment and assignment of benefits for services provided during this visit. Patient/Guardian expressed understanding and agreed to proceed.     Total encounter time 18 minutes which includes face-to-face time, chart reviewed, care coordination, order entry and documentation during this encounter.   Note: This document was prepared by [employer] voice dictation technology and any errors that results from this process are unintentional.    Leni ONEIDA Client, MD 11/02/2023

## 2023-12-21 ENCOUNTER — Other Ambulatory Visit: Payer: Self-pay | Admitting: Family Medicine

## 2023-12-21 DIAGNOSIS — Z0000 Encounter for general adult medical examination without abnormal findings: Secondary | ICD-10-CM

## 2024-01-11 ENCOUNTER — Ambulatory Visit
Admission: RE | Admit: 2024-01-11 | Discharge: 2024-01-11 | Disposition: A | Discharge status: Home / Self Care | Source: Ambulatory Visit | Attending: Family Medicine | Admitting: Family Medicine

## 2024-01-11 DIAGNOSIS — Z0000 Encounter for general adult medical examination without abnormal findings: Secondary | ICD-10-CM

## 2024-02-01 ENCOUNTER — Encounter (HOSPITAL_COMMUNITY): Payer: Self-pay | Admitting: Psychiatry

## 2024-02-01 ENCOUNTER — Telehealth (HOSPITAL_BASED_OUTPATIENT_CLINIC_OR_DEPARTMENT_OTHER): Admitting: Psychiatry

## 2024-02-01 VITALS — Wt 170.0 lb

## 2024-02-01 DIAGNOSIS — F419 Anxiety disorder, unspecified: Secondary | ICD-10-CM | POA: Diagnosis not present

## 2024-02-01 DIAGNOSIS — F3160 Bipolar disorder, current episode mixed, unspecified: Secondary | ICD-10-CM

## 2024-02-01 MED ORDER — LURASIDONE HCL 120 MG PO TABS: 120.0000 mg | ORAL_TABLET | Freq: Every day | ORAL | 2 refills | Status: DC

## 2024-02-01 NOTE — Progress Notes (Signed)
 [facility] MD Virtual Progress Note   Patient Location: Home Provider Location: Home Office  I connect with patient by video and verified that I am speaking with correct person by using two identifiers. I discussed the limitations of evaluation and management by telemedicine and the availability of in person appointments. I also discussed with the patient that there may be a patient responsible charge related to this service. The patient expressed understanding and agreed to proceed.  Vicki Huynh [id number] 43 y.o.  02/01/2024 10:50 AM  History of Present Illness:  Patient is evaluated by video session.  She started taking hydroxyzine  10 mg every night because she noticed anxiety coming back and she was not sleeping well.  Since back on hydroxyzine  her sleep and anxiety is better.  She is compliant with Latuda  which is helping her bipolar disorder.  She denies any mania, psychosis, hallucination, anger, irritability or any highs and lows.  She is on Mounjaro prescribed by Olam Pinal.  She lost another 10 pounds.  She denies any tremors, shakes or any EPS.  She is taking a higher dose of Mounjaro once a week.  Patient reported family life is good.  Her 91 year old son decided not to continue education and like to a agricultural consultant.  Patient had a good support from her husband.  She wants to continue Latuda  and hydroxyzine .  She has enough refills of hydroxyzine  but like to get a new prescription of Latuda .  Past Psychiatric History: H/O depression, mood swings, irritability, mania and impulsive behavior.  PCP prescribed Effexor in 2012 worked for a while. Tried Lamictal (hallucination), Buspar  (groggy), Klonopin , higher dose of Vistaril  and melatonin. H/O ETOH and THC but sober since 2009.  No h/o inpatient or any suicidal attempt.     Past Medical History:  Diagnosis Date   Anxiety    Arthritis    oa in hands and legs   Bipolar depression (HCC)    bipolar 1    Depression    Diabetic mellitus type 2 2012   GERD (gastroesophageal reflux disease)    Headache    occ migaines, tension headache mostly   PONV (postoperative nausea and vomiting)    likes scopolamine  patch   Sleep apnea    mild osa no cpap needed   Wears glasses     Outpatient Encounter Medications as of 02/01/2024  Medication Sig   ACCU-CHEK FASTCLIX LANCETS MISC    ACCU-CHEK GUIDE test strip    acetaminophen  (TYLENOL ) 500 MG tablet Take 2 tablets (1,000 mg total) by mouth every 8 (eight) hours as needed for mild pain or moderate pain.   bismuth subsalicylate (PEPTO BISMOL) 262 MG chewable tablet Chew 524 mg by mouth daily as needed for indigestion.   dicyclomine  (BENTYL ) 20 MG tablet Take 1 tablet (20 mg total) by mouth 2 (two) times daily as needed for up to 20 doses for spasms.   esomeprazole (NEXIUM) 40 MG capsule Take 40 mg by mouth 2 (two) times daily before a meal.   hydrOXYzine  (ATARAX ) 10 MG tablet Take 1 tablet (10 mg total) by mouth daily as needed for anxiety. (Patient not taking: Reported on 11/02/2023)   ibuprofen  (ADVIL ) 200 MG tablet Take 200-400 mg by mouth every 6 (six) hours as needed for mild pain or headache.   losartan  (COZAAR ) 25 MG tablet Take 25 mg by mouth every evening.    Lurasidone  HCl (LATUDA ) 120 MG TABS Take 1 tablet (120 mg total) by mouth at bedtime.  metFORMIN (GLUCOPHAGE-XR) 500 MG 24 hr tablet Take 1,000 mg by mouth daily before supper.   MOUNJARO 5 MG/0.5ML Pen Inject 5 mg into the skin once a week.   ondansetron  (ZOFRAN -ODT) 4 MG disintegrating tablet Take 1 tablet (4 mg total) by mouth every 8 (eight) hours as needed for nausea or vomiting. (Patient taking differently: Take 4 mg by mouth every 8 (eight) hours as needed for nausea or vomiting (DISSOLVE ORALLY).)   rosuvastatin (CRESTOR) 5 MG tablet Take 5 mg by mouth See admin instructions. Take 5 mg by mouth every other evening   No facility-administered encounter medications on file as of  02/01/2024.    No results found for this or any previous visit (from the past 2160 hours).   Psychiatric Specialty Exam: Physical Exam  Review of Systems  Gastrointestinal:  Positive for nausea.    Weight 170 lb (77.1 kg), last menstrual period 12/27/2020.There is no height or weight on file to calculate BMI.  General Appearance: Casual  Eye Contact:  Good  Speech:  Clear and Coherent  Volume:  Normal  Mood:  Euthymic  Affect:  Appropriate  Thought Process:  Goal Directed  Orientation:  Full (Time, Place, and Person)  Thought Content:  WDL  Suicidal Thoughts:  No  Homicidal Thoughts:  No  Memory:  Immediate;   Good Recent;   Good Remote;   Good  Judgement:  Good  Insight:  Good  Psychomotor Activity:  Normal  Concentration:  Concentration: Good and Attention Span: Good  Recall:  Good  Fund of Knowledge:  Good  Language:  Good  Akathisia:  No  Handed:  Right  AIMS (if indicated):     Assets:  Communication Skills Desire for Improvement Housing Resilience Social Support Transportation  ADL's:  Intact  Cognition:  WNL  Sleep: Okay with hydroxyzine        06/04/2020    1:17 PM 01/06/2019   10:28 AM 01/31/2016    9:26 AM  Depression screen PHQ 2/9  Decreased Interest 1 0 0  Down, Depressed, Hopeless 0 0 0  PHQ - 2 Score 1 0 0    Assessment/Plan: Bipolar 1 disorder, mixed (HCC) - Plan: Lurasidone  HCl (LATUDA ) 120 MG TABS  Anxiety - Plan: Lurasidone  HCl (LATUDA ) 120 MG TABS  Patient is back on hydroxyzine  10 mg which is helping her anxiety and sleep.  She is not sure what triggered but noticed not sleeping well and now she is getting good sleep with hydroxyzine .  She wants to continue Latuda  which is helping her bipolar disorder.  So far no major concern or side effects.  She is on Mounjaro injection once a week and helping her weight loss.  She has occasionally nausea and takes Zofran  when she needed.  Continue Latuda  120 mg daily.  She has enough hydroxyzine  with 2  more additional refills and does not need a new prescription at this time.  She promised to give us  a callback when she need a new prescription of hydroxyzine  10 mg.  Recommend to call back if any question.  Will follow-up in 3 months unless patient requested an earlier appointment.  Follow Up Instructions:     I discussed the assessment and treatment plan with the patient. The patient was provided an opportunity to ask questions and all were answered. The patient agreed with the plan and demonstrated an understanding of the instructions.   The patient was advised to call back or seek an in-person evaluation if the symptoms worsen  or if the condition fails to improve as anticipated.    Collaboration of Care: Other provider involved in patient's care AEB notes are available in epic to review  Patient/Guardian was advised Release of Information must be obtained prior to any record release in order to collaborate their care with an outside provider. Patient/Guardian was advised if they have not already done so to contact the registration department to sign all necessary forms in order for us  to release information regarding their care.   Consent: Patient/Guardian gives verbal consent for treatment and assignment of benefits for services provided during this visit. Patient/Guardian expressed understanding and agreed to proceed.     Total encounter time 17 minutes which includes face-to-face time, chart reviewed, care coordination, order entry and documentation during this encounter.   Note: This document was prepared by [employer] voice dictation technology and any errors that results from this process are unintentional.    Leni ONEIDA Client, MD 02/01/2024

## 2024-03-26 ENCOUNTER — Other Ambulatory Visit (HOSPITAL_COMMUNITY): Payer: Self-pay | Admitting: Psychiatry

## 2024-03-26 DIAGNOSIS — F3160 Bipolar disorder, current episode mixed, unspecified: Secondary | ICD-10-CM

## 2024-03-26 DIAGNOSIS — F419 Anxiety disorder, unspecified: Secondary | ICD-10-CM

## 2024-04-12 ENCOUNTER — Telehealth (HOSPITAL_COMMUNITY): Payer: Self-pay | Admitting: *Deleted

## 2024-04-12 NOTE — Telephone Encounter (Signed)
 Pt to advise that she feels she's experiencing hypomanic episode and wants to know if she should make an earlier appointment or what your advice would be. Pt is currently scheduled for f/u on 05/03/24.

## 2024-04-13 NOTE — Telephone Encounter (Signed)
 Pt now scheduled for 04/14/24 @ 0920 virtual visit.

## 2024-04-13 NOTE — Telephone Encounter (Signed)
 She can try Depakote 250 mg at bedtime and still having symptoms and I can see her sooner than her scheduled appointment.  If agree please call prescription to her pharmacy.

## 2024-04-14 ENCOUNTER — Telehealth (HOSPITAL_COMMUNITY): Admitting: Psychiatry

## 2024-04-14 ENCOUNTER — Encounter (HOSPITAL_COMMUNITY): Payer: Self-pay | Admitting: Psychiatry

## 2024-04-14 VITALS — Wt 167.0 lb

## 2024-04-14 DIAGNOSIS — F3160 Bipolar disorder, current episode mixed, unspecified: Secondary | ICD-10-CM

## 2024-04-14 DIAGNOSIS — F419 Anxiety disorder, unspecified: Secondary | ICD-10-CM | POA: Diagnosis not present

## 2024-04-14 MED ORDER — LITHIUM CARBONATE 150 MG PO CAPS: 150.0000 mg | ORAL_CAPSULE | Freq: Two times a day (BID) | ORAL | 0 refills | Status: AC

## 2024-04-14 MED ORDER — HYDROXYZINE HCL 10 MG PO TABS: 10.0000 mg | ORAL_TABLET | Freq: Every day | ORAL | 0 refills | Status: AC | PRN

## 2024-04-14 MED ORDER — LURASIDONE HCL 120 MG PO TABS: 120.0000 mg | ORAL_TABLET | Freq: Every day | ORAL | 0 refills | Status: AC

## 2024-04-14 NOTE — Progress Notes (Signed)
 " [facility] MD Virtual Progress Note   Patient Location: Home Provider Location: Office  I connect with patient by video and verified that I am speaking with correct person by using two identifiers. I discussed the limitations of evaluation and management by telemedicine and the availability of in person appointments. I also discussed with the patient that there may be a patient responsible charge related to this service. The patient expressed understanding and agreed to proceed.  Vicki Huynh [id number] 43 y.o.  04/14/2024 9:24 AM  History of Present Illness:  Patient is evaluated by video session.  She requested an earlier appointment because she believed having a manic episode.  For past 1 week she reported intrusive thoughts, hallucination, weird thinking but denies any active or passive suicidal thoughts.  She reported sleep is okay with the hydroxyzine  and she is lately taking every night.  She is not sure what trigger these thoughts.  Patient also reported these thoughts are making her sad and depressed.  She do not recall missing medication.  She reported that everything is okay at home.  Son is doing home schooling and she had a good support from her husband.  She also talked to her very close friend who lives in [place]  and had bipolar disorder.  She is taking Mounjaro and sometimes she having nausea.  Though she had not lost significant weight but able to lost few pounds since the last visit.  She denies any agitation, anger, impulsive behavior.  Her appetite is fair.  Her energy level is okay.  She has no tremor or shakes or any EPS.  She denies drinking or using any illegal substances.  Past Psychiatric History: H/O depression, mood swings, irritability, mania and impulsive behavior.  PCP prescribed Effexor in 2012 worked for a while. Tried Lamictal (hallucination), Buspar  (groggy), Klonopin , higher dose of Vistaril  and melatonin. H/O ETOH and THC but sober since 2009.   No h/o inpatient or any suicidal attempt.     Past Medical History:  Diagnosis Date   Anxiety    Arthritis    oa in hands and legs   Bipolar depression (HCC)    bipolar 1   Depression    Diabetic mellitus type 2 2012   GERD (gastroesophageal reflux disease)    Headache    occ migaines, tension headache mostly   PONV (postoperative nausea and vomiting)    likes scopolamine  patch   Sleep apnea    mild osa no cpap needed   Wears glasses     Outpatient Encounter Medications as of 04/14/2024  Medication Sig   ACCU-CHEK FASTCLIX LANCETS MISC    ACCU-CHEK GUIDE test strip    acetaminophen  (TYLENOL ) 500 MG tablet Take 2 tablets (1,000 mg total) by mouth every 8 (eight) hours as needed for mild pain or moderate pain.   bismuth subsalicylate (PEPTO BISMOL) 262 MG chewable tablet Chew 524 mg by mouth daily as needed for indigestion.   dicyclomine  (BENTYL ) 20 MG tablet Take 1 tablet (20 mg total) by mouth 2 (two) times daily as needed for up to 20 doses for spasms.   esomeprazole (NEXIUM) 40 MG capsule Take 40 mg by mouth 2 (two) times daily before a meal.   hydrOXYzine  (ATARAX ) 10 MG tablet Take 1 tablet (10 mg total) by mouth daily as needed for anxiety.   ibuprofen  (ADVIL ) 200 MG tablet Take 200-400 mg by mouth every 6 (six) hours as needed for mild pain or headache.   losartan  (COZAAR ) 25  MG tablet Take 25 mg by mouth every evening.    Lurasidone  HCl (LATUDA ) 120 MG TABS Take 1 tablet (120 mg total) by mouth at bedtime.   metFORMIN (GLUCOPHAGE-XR) 500 MG 24 hr tablet Take 1,000 mg by mouth daily before supper.   MOUNJARO 12.5 MG/0.5ML Pen Inject 12.5 mg into the skin once a week.   ondansetron  (ZOFRAN -ODT) 4 MG disintegrating tablet Take 1 tablet (4 mg total) by mouth every 8 (eight) hours as needed for nausea or vomiting. (Patient taking differently: Take 4 mg by mouth every 8 (eight) hours as needed for nausea or vomiting (DISSOLVE ORALLY).)   rosuvastatin (CRESTOR) 5 MG tablet Take 5  mg by mouth See admin instructions. Take 5 mg by mouth every other evening   No facility-administered encounter medications on file as of 04/14/2024.    No results found for this or any previous visit (from the past 2160 hours).   Psychiatric Specialty Exam: Physical Exam  Review of Systems  Psychiatric/Behavioral:  Positive for dysphoric mood. The patient is nervous/anxious.     Weight 167 lb (75.8 kg), last menstrual period 12/27/2020.There is no height or weight on file to calculate BMI.  General Appearance: Casual  Eye Contact:  Good  Speech:  Normal Rate  Volume:  Normal  Mood:  Anxious  Affect:  Appropriate  Thought Process:  Descriptions of Associations: Intact  Orientation:  Full (Time, Place, and Person)  Thought Content:  Hallucinations: Auditory Hearing humming sound and Rumination  Suicidal Thoughts:  No  Homicidal Thoughts:  No  Memory:  Immediate;   Good Recent;   Good Remote;   Good  Judgement:  Good  Insight:  Good  Psychomotor Activity:  Normal  Concentration:  Concentration: Good and Attention Span: Good  Recall:  Good  Fund of Knowledge:  Good  Language:  Good  Akathisia:  No  Handed:  Right  AIMS (if indicated):     Assets:  Communication Skills Desire for Improvement Housing Resilience Social Support Transportation  ADL's:  Intact  Cognition:  WNL  Sleep: Okay with hydroxyzine        06/04/2020    1:17 PM 01/06/2019   10:28 AM 01/31/2016    9:26 AM  Depression screen PHQ 2/9  Decreased Interest 1 0 0  Down, Depressed, Hopeless 0 0 0  PHQ - 2 Score 1 0 0    Assessment/Plan: Bipolar 1 disorder, mixed (HCC) - Plan: hydrOXYzine  (ATARAX ) 10 MG tablet, Lurasidone  HCl (LATUDA ) 120 MG TABS, lithium  carbonate 150 MG capsule  Anxiety - Plan: hydrOXYzine  (ATARAX ) 10 MG tablet, Lurasidone  HCl (LATUDA ) 120 MG TABS, lithium  carbonate 150 MG capsule  Patient is a 44 year old married female with history of bipolar disorder and anxiety.  Requested an  earlier appointment because not doing very well and believes she has manic episodes.  No active or passive suicidal thoughts but intrusive thoughts, hallucinations and humming noises.  Review current medication and past medication.  Unclear what triggered these symptoms.  Will try very low-dose lithium  150 mg twice a day.  In the past she had taken higher dose of hydroxyzine  but it makes her very groggy and sleepy.  Discussed possible side effects of lithium .  If symptoms do not improve we may consider switching Latuda  to a different medication.  So far she does not want to change the Latuda  since it is helping most of her symptoms.  I also discussed if need to see a therapist but patient at this time wanted to  see if the medicine alone can help.  She will need a new prescription of hydroxyzine  to do.  She has Zofran  and if lithium  causes nausea she can take it.  Follow-up in 3 to 4 weeks.  Discussed safety concerns and anytime having active suicidal thoughts or homicidal thought that she need to call 911 or go to local emergency room.   Follow Up Instructions:     I discussed the assessment and treatment plan with the patient. The patient was provided an opportunity to ask questions and all were answered. The patient agreed with the plan and demonstrated an understanding of the instructions.   The patient was advised to call back or seek an in-person evaluation if the symptoms worsen or if the condition fails to improve as anticipated.    Collaboration of Care: Other provider involved in patient's care AEB notes are available in epic to review  Patient/Guardian was advised Release of Information must be obtained prior to any record release in order to collaborate their care with an outside provider. Patient/Guardian was advised if they have not already done so to contact the registration department to sign all necessary forms in order for us  to release information regarding their care.   Consent:  Patient/Guardian gives verbal consent for treatment and assignment of benefits for services provided during this visit. Patient/Guardian expressed understanding and agreed to proceed.     Total encounter time 31 minutes which includes face-to-face time, chart reviewed, care coordination, order entry and documentation during this encounter.   Note: This document was prepared by [employer] voice dictation technology and any errors that results from this process are unintentional.    Leni ONEIDA Client, MD 04/14/2024   "

## 2024-05-03 ENCOUNTER — Telehealth (HOSPITAL_COMMUNITY): Admitting: Psychiatry

## 2024-05-06 ENCOUNTER — Other Ambulatory Visit (HOSPITAL_COMMUNITY): Payer: Self-pay | Admitting: Psychiatry

## 2024-05-06 DIAGNOSIS — F3160 Bipolar disorder, current episode mixed, unspecified: Secondary | ICD-10-CM

## 2024-05-06 DIAGNOSIS — F419 Anxiety disorder, unspecified: Secondary | ICD-10-CM

## 2024-05-27 ENCOUNTER — Telehealth (HOSPITAL_COMMUNITY): Admitting: Psychiatry
